# Patient Record
Sex: Female | Born: 1955 | Race: Black or African American | Hispanic: No | Marital: Single | Smoking: Never smoker
Health system: Southern US, Community
[De-identification: ages and names within clinical notes are randomized; demographics above are authoritative.]

## PROBLEM LIST (undated history)

## (undated) DIAGNOSIS — E119 Type 2 diabetes mellitus without complications: Secondary | ICD-10-CM

## (undated) DIAGNOSIS — M1990 Unspecified osteoarthritis, unspecified site: Secondary | ICD-10-CM

## (undated) DIAGNOSIS — M109 Gout, unspecified: Secondary | ICD-10-CM

## (undated) DIAGNOSIS — I10 Essential (primary) hypertension: Secondary | ICD-10-CM

## (undated) DIAGNOSIS — B019 Varicella without complication: Secondary | ICD-10-CM

## (undated) DIAGNOSIS — N390 Urinary tract infection, site not specified: Secondary | ICD-10-CM

## (undated) HISTORY — DX: Essential (primary) hypertension: I10

## (undated) HISTORY — DX: Gout, unspecified: M10.9

## (undated) HISTORY — PX: NO PAST SURGERIES: SHX2092

## (undated) HISTORY — DX: Varicella without complication: B01.9

## (undated) HISTORY — DX: Urinary tract infection, site not specified: N39.0

## (undated) HISTORY — DX: Type 2 diabetes mellitus without complications: E11.9

## (undated) HISTORY — DX: Unspecified osteoarthritis, unspecified site: M19.90

---

## 2004-04-24 ENCOUNTER — Emergency Department (HOSPITAL_COMMUNITY): Admission: EM | Admit: 2004-04-24 | Discharge: 2004-04-24 | Payer: Self-pay | Admitting: Emergency Medicine

## 2007-01-17 ENCOUNTER — Encounter: Admission: RE | Admit: 2007-01-17 | Discharge: 2007-01-17 | Payer: Self-pay | Admitting: Neurology

## 2008-04-30 ENCOUNTER — Emergency Department (HOSPITAL_COMMUNITY): Admission: EM | Admit: 2008-04-30 | Discharge: 2008-04-30 | Payer: Self-pay | Admitting: Emergency Medicine

## 2010-01-15 ENCOUNTER — Ambulatory Visit (HOSPITAL_COMMUNITY): Admission: RE | Admit: 2010-01-15 | Discharge: 2010-01-15 | Payer: Self-pay | Admitting: Obstetrics and Gynecology

## 2011-01-12 ENCOUNTER — Other Ambulatory Visit (HOSPITAL_COMMUNITY)
Admission: RE | Admit: 2011-01-12 | Discharge: 2011-01-12 | Disposition: A | Discharge status: Home / Self Care | Payer: Self-pay | Source: Ambulatory Visit | Attending: Obstetrics and Gynecology | Admitting: Obstetrics and Gynecology

## 2011-01-12 DIAGNOSIS — Z01419 Encounter for gynecological examination (general) (routine) without abnormal findings: Secondary | ICD-10-CM | POA: Insufficient documentation

## 2011-02-09 ENCOUNTER — Other Ambulatory Visit: Payer: Self-pay | Admitting: Obstetrics and Gynecology

## 2011-02-09 DIAGNOSIS — Z1231 Encounter for screening mammogram for malignant neoplasm of breast: Secondary | ICD-10-CM

## 2011-02-17 ENCOUNTER — Ambulatory Visit (HOSPITAL_COMMUNITY)
Admission: RE | Admit: 2011-02-17 | Discharge: 2011-02-17 | Disposition: A | Discharge status: Home / Self Care | Payer: Self-pay | Source: Ambulatory Visit | Attending: Obstetrics and Gynecology | Admitting: Obstetrics and Gynecology

## 2011-02-17 DIAGNOSIS — Z1231 Encounter for screening mammogram for malignant neoplasm of breast: Secondary | ICD-10-CM

## 2012-01-11 ENCOUNTER — Other Ambulatory Visit (HOSPITAL_COMMUNITY)
Admission: RE | Admit: 2012-01-11 | Discharge: 2012-01-11 | Disposition: A | Discharge status: Home / Self Care | Payer: Self-pay | Source: Ambulatory Visit | Attending: Obstetrics and Gynecology | Admitting: Obstetrics and Gynecology

## 2012-01-11 DIAGNOSIS — Z124 Encounter for screening for malignant neoplasm of cervix: Secondary | ICD-10-CM | POA: Insufficient documentation

## 2012-01-21 ENCOUNTER — Other Ambulatory Visit: Payer: Self-pay | Admitting: Obstetrics and Gynecology

## 2012-01-21 DIAGNOSIS — Z1231 Encounter for screening mammogram for malignant neoplasm of breast: Secondary | ICD-10-CM

## 2012-01-29 ENCOUNTER — Ambulatory Visit: Payer: Self-pay

## 2012-01-29 ENCOUNTER — Ambulatory Visit: Payer: Self-pay | Admitting: Family Medicine

## 2012-01-29 VITALS — BP 173/75 | HR 96 | Temp 98.7°F | Resp 18

## 2012-01-29 DIAGNOSIS — M79673 Pain in unspecified foot: Secondary | ICD-10-CM

## 2012-01-29 DIAGNOSIS — M7730 Calcaneal spur, unspecified foot: Secondary | ICD-10-CM

## 2012-01-29 DIAGNOSIS — M722 Plantar fascial fibromatosis: Secondary | ICD-10-CM

## 2012-01-29 DIAGNOSIS — M79609 Pain in unspecified limb: Secondary | ICD-10-CM

## 2012-01-29 MED ORDER — NABUMETONE 750 MG PO TABS: 750.0000 mg | ORAL_TABLET | Freq: Two times a day (BID) | ORAL | Status: AC

## 2012-01-29 NOTE — Patient Instructions (Signed)

## 2012-01-29 NOTE — Progress Notes (Signed)
Subjective: A couple of weeks ago the patient into the airports going to [place] and developed heel pain. It calmed back down, but then last week was a very busy week for her. She is on her feet a lot. She did look things up and had determined that she had plantar fasciitis. She been trying to do stretches with a towel, but he continues to hurt her a lot. She has not had problems with plantar fasciitis in the past.  Objective: Ankle is normal without edema. Pedal pulses present. Moves toes normally. He is tender on the plantar surface distal aspect of the heel.  [facility] reading (PRIMARY) by  Dr. Alwyn Ren Calcaneal spur, moderate.  No stress fractures  Exercises. Stretching. NSAID. Urged weight loss. We are heel sole inserts. We're comfortable shoes.Marland Kitchen

## 2012-02-18 ENCOUNTER — Ambulatory Visit (HOSPITAL_COMMUNITY)
Admission: RE | Admit: 2012-02-18 | Discharge: 2012-02-18 | Disposition: A | Discharge status: Home / Self Care | Payer: Self-pay | Source: Ambulatory Visit | Attending: Obstetrics and Gynecology | Admitting: Obstetrics and Gynecology

## 2012-02-18 DIAGNOSIS — Z1231 Encounter for screening mammogram for malignant neoplasm of breast: Secondary | ICD-10-CM | POA: Insufficient documentation

## 2012-03-10 ENCOUNTER — Ambulatory Visit: Payer: Self-pay | Admitting: Emergency Medicine

## 2012-03-10 VITALS — BP 131/85 | HR 101 | Temp 98.3°F | Resp 17 | Ht 70.0 in | Wt >= 6400 oz

## 2012-03-10 DIAGNOSIS — G510 Bell's palsy: Secondary | ICD-10-CM

## 2012-03-10 MED ORDER — VALACYCLOVIR HCL 1 G PO TABS: 1000.0000 mg | ORAL_TABLET | Freq: Two times a day (BID) | ORAL | Status: AC

## 2012-03-10 MED ORDER — PREDNISONE 20 MG PO TABS: ORAL_TABLET | ORAL | Status: DC

## 2012-03-10 NOTE — Patient Instructions (Signed)
Bell's Palsy  Bell's palsy is a condition in which the muscles on one side of the face cannot move (paralysis). This is because the nerves in the face are paralyzed. It is most often thought to be caused by a virus. The virus causes swelling of the nerve that controls movement on one side of the face. The nerve travels through a tight space surrounded by bone. When the nerve swells, it can be compressed by the bone. This results in damage to the protective covering around the nerve. This damage interferes with how the nerve communicates with the muscles of the face. As a result, it can cause weakness or paralysis of the facial muscles.   Injury (trauma), tumor, and surgery may cause Bell's palsy, but most of the time the cause is unknown. It is a relatively common condition. It starts suddenly (abrupt onset) with the paralysis usually ending within 2 days. Bell's palsy is not dangerous. But because the eye does not close properly, you may need care to keep the eye from getting dry. This can include splinting (to keep the eye shut) or moistening with artificial tears. Bell's palsy very seldom occurs on both sides of the face at the same time.  SYMPTOMS    Eyebrow sagging.   Drooping of the eyelid and corner of the mouth.   Inability to close one eye.   Loss of taste on the front of the tongue.   Sensitivity to loud noises.  TREATMENT   The treatment is usually non-surgical. If the patient is seen within the first 24 to 48 hours, a short course of steroids may be prescribed, in an attempt to shorten the length of the condition. Antiviral medicines may also be used with the steroids, but it is unclear if they are helpful.   You will need to protect your eye, if you cannot close it. The cornea (clear covering over your eye) will become dry and can be damaged. Artificial tears can be used to keep your eye moist. Glasses or an eye patch should be worn to protect your eye.  PROGNOSIS   Recovery is variable, ranging  from days to months. Although the problem usually goes away completely (about 80% of cases resolve), predicting the outcome is impossible. Most people improve within 3 weeks of when the symptoms began. Improvement may continue for 3 to 6 months. A small number of people have moderate to severe weakness that is permanent.   HOME CARE INSTRUCTIONS    If your caregiver prescribed medication to reduce swelling in the nerve, use as directed. Do not stop taking the medication unless directed by your caregiver.   Use moisturizing eye drops as needed to prevent drying of your eye, as directed by your caregiver.   Protect your eye, as directed by your caregiver.   Use facial massage and exercises, as directed by your caregiver.   Perform your normal activities, and get your normal rest.  SEEK IMMEDIATE MEDICAL CARE IF:    There is pain, redness or irritation in the eye.   You or your child has an oral temperature above 102 F (38.9 C), not controlled by medicine.  MAKE SURE YOU:    Understand these instructions.   Will watch your condition.   Will get help right away if you are not doing well or get worse.  Document Released: 08/23/2005 Document Revised: 08/12/2011 Document Reviewed: 09/01/2009  Exit[employer] Patient Information 2012 [employer].

## 2012-03-10 NOTE — Progress Notes (Signed)
  Subjective:    Patient ID: Mckenzie [name], female    DOB: 12-Mar-1956, 56 y.o.   MRN: [id number]  HPI patient started yesterday with pain and discomfort behind her left ear. She has a history of recurrent Bell's palsy her last was in 2008. She saw neurologist Dr. Lissa Morales. She's had no recurrent issues since that time until this most recent episode she has inability to close her left eye is paralysis of the left side of her face the    Review of Systems patient is a diabetic but keeps it under control with metformin and diet and exercise     Objective:   Physical Exam  Constitutional: She is oriented to person, place, and time. She appears well-developed.  HENT:  Head: Normocephalic.  Eyes: Pupils are equal, round, and reactive to light.  Cardiovascular: Normal rate.   Neurological: She is alert and oriented to person, place, and time. She has normal reflexes. She displays normal reflexes. She exhibits normal muscle tone. Coordination normal.       There is an isolated left seventh nerve palsy. She is incomplete I closure and asymmetrical smile.  Psychiatric: She has a normal mood and affect. Her behavior is normal. Judgment and thought content normal.          Assessment & Plan:  The patient presents with Bell's palsy. This is her third episode. I offered her the option of seeing the neurologist again to get their opinion since this is a recurrent issue at present she wants to treat with Valtrex prednisone and Lacri-Lube

## 2013-02-27 ENCOUNTER — Other Ambulatory Visit: Payer: Self-pay | Admitting: Obstetrics and Gynecology

## 2013-02-27 DIAGNOSIS — Z1231 Encounter for screening mammogram for malignant neoplasm of breast: Secondary | ICD-10-CM

## 2013-03-01 ENCOUNTER — Ambulatory Visit (HOSPITAL_COMMUNITY)
Admission: RE | Admit: 2013-03-01 | Discharge: 2013-03-01 | Disposition: A | Discharge status: Home / Self Care | Payer: Self-pay | Source: Ambulatory Visit | Attending: Obstetrics and Gynecology | Admitting: Obstetrics and Gynecology

## 2013-03-01 DIAGNOSIS — Z1231 Encounter for screening mammogram for malignant neoplasm of breast: Secondary | ICD-10-CM | POA: Insufficient documentation

## 2013-03-22 ENCOUNTER — Ambulatory Visit: Payer: Self-pay | Admitting: Emergency Medicine

## 2013-03-22 VITALS — BP 170/100 | HR 88 | Temp 98.2°F | Resp 18 | Ht 72.0 in | Wt >= 6400 oz

## 2013-03-22 DIAGNOSIS — I10 Essential (primary) hypertension: Secondary | ICD-10-CM | POA: Insufficient documentation

## 2013-03-22 DIAGNOSIS — R8281 Pyuria: Secondary | ICD-10-CM

## 2013-03-22 DIAGNOSIS — R8290 Unspecified abnormal findings in urine: Secondary | ICD-10-CM

## 2013-03-22 DIAGNOSIS — R030 Elevated blood-pressure reading, without diagnosis of hypertension: Secondary | ICD-10-CM

## 2013-03-22 DIAGNOSIS — R82998 Other abnormal findings in urine: Secondary | ICD-10-CM

## 2013-03-22 LAB — POCT URINALYSIS DIPSTICK
Glucose, UA: NEGATIVE
Ketones, UA: NEGATIVE
Spec Grav, UA: >=1.03
Urobilinogen, UA: 0.2
pH, UA: 5.5

## 2013-03-22 LAB — POCT CBC
Granulocyte percent: 61.9 % (ref 37–80)
HCT, POC: 43.6 % (ref 37.7–47.9)
Hemoglobin: 13.5 g/dL (ref 12.2–16.2)
Lymph, poc: 2.9 (ref 0.6–3.4)
MCH, POC: 30.3 pg (ref 27–31.2)
MCHC: 31 g/dL — AB (ref 31.8–35.4)
MCV: 97.8 fL — AB (ref 80–97)
MID (cbc): 0.7 (ref 0–0.9)
MPV: 8.4 fL (ref 0–99.8)
POC Granulocyte: 5.8 (ref 2–6.9)
POC LYMPH PERCENT: 30.6 % (ref 10–50)
POC MID %: 7.5 % (ref 0–12)
Platelet Count, POC: 360 10*3/uL (ref 142–424)
RBC: 4.46 M/uL (ref 4.04–5.48)
RDW, POC: 16.5 %
WBC: 9.4 10*3/uL (ref 4.6–10.2)

## 2013-03-22 LAB — POCT UA - MICROSCOPIC ONLY
Casts, Ur, LPF, POC: NEGATIVE
Crystals, Ur, HPF, POC: NEGATIVE
Mucus, UA: POSITIVE
Yeast, UA: NEGATIVE

## 2013-03-22 MED ORDER — CIPROFLOXACIN HCL 500 MG PO TABS: 500.0000 mg | ORAL_TABLET | Freq: Two times a day (BID) | ORAL | Status: DC

## 2013-03-22 MED ORDER — HYDROCHLOROTHIAZIDE 12.5 MG PO CAPS: 12.5000 mg | ORAL_CAPSULE | ORAL | Status: DC

## 2013-03-22 MED ORDER — AMLODIPINE BESYLATE 5 MG PO TABS: 5.0000 mg | ORAL_TABLET | Freq: Every day | ORAL | Status: DC

## 2013-03-22 NOTE — Patient Instructions (Signed)

## 2013-03-22 NOTE — Progress Notes (Signed)
  Subjective:    Patient ID: Mckenzie [name], female    DOB: [birth year], 57 y.o.   MRN: [id number]  HPI Went to Gyn for depo shot and blood pressure was up.  She was told to monitor and to follow up primary. No chest pains, no dizziness she states she feels fine. She has no history of hypertension. She has no family history of hypertension. She's never been on medications. She denies any symptoms.   Review of Systems     Objective:   Physical Exam patient is alert and cooperative in no distress. Neck is supple chest is clear to auscultation and percussion cardiac exam reveals a regular rate without murmurs rubs or gallops abdomen is soft liver spleen not enlarged no tenderness. Extremity exam shows trace edema.  Results for orders placed in visit on 03/22/13  POCT URINALYSIS DIPSTICK      Result Value Range   Color, UA yellow     Clarity, UA turbid     Glucose, UA neg     Bilirubin, UA neg     Ketones, UA neg     Spec Grav, UA >=1.030     Blood, UA moderate     pH, UA 5.5     Protein, UA >=300     Urobilinogen, UA 0.2     Nitrite, UA positive     Leukocytes, UA moderate (2+)    POCT CBC      Result Value Range   WBC 9.4  4.6 - 10.2 K/uL   Lymph, poc 2.9  0.6 - 3.4   POC LYMPH PERCENT 30.6  10 - 50 %L   MID (cbc) 0.7  0 - 0.9   POC MID % 7.5  0 - 12 %M   POC Granulocyte 5.8  2 - 6.9   Granulocyte percent 61.9  37 - 80 %G   RBC 4.46  4.04 - 5.48 M/uL   Hemoglobin 13.5  12.2 - 16.2 g/dL   HCT, POC 78.2  95.6 - 47.9 %   MCV 97.8 (*) 80 - 97 fL   MCH, POC 30.3  27 - 31.2 pg   MCHC 31.0 (*) 31.8 - 35.4 g/dL   RDW, POC 21.3     Platelet Count, POC 360  142 - 424 K/uL   MPV 8.4  0 - 99.8 fL  POCT UA - MICROSCOPIC ONLY      Result Value Range   WBC, Ur, HPF, POC 30-40     RBC, urine, microscopic 25-30     Bacteria, U Microscopic 3+     Mucus, UA positive     Epithelial cells, urine per micros 10-15     Crystals, Ur, HPF, POC neg     Casts, Ur, LPF, POC neg     Yeast, UA  neg          Assessment & Plan:  We'll start with amlodipine and HCTZ recheck blood pressure 2 weeks urine culture was done also treat her urinary tract infection and repeat urine on her followup visit.

## 2013-03-23 LAB — COMPREHENSIVE METABOLIC PANEL
ALT: 13 U/L (ref 0–35)
AST: 13 U/L (ref 0–37)
Alkaline Phosphatase: 64 U/L (ref 39–117)
BUN: 12 mg/dL (ref 6–23)
Calcium: 9.7 mg/dL (ref 8.4–10.5)
Creat: 0.7 mg/dL (ref 0.50–1.10)
Potassium: 3.7 mEq/L (ref 3.5–5.3)
Total Protein: 7.8 g/dL (ref 6.0–8.3)

## 2013-03-23 LAB — TSH: TSH: 3.927 u[IU]/mL (ref 0.350–4.500)

## 2013-04-05 ENCOUNTER — Ambulatory Visit: Payer: Self-pay | Admitting: Emergency Medicine

## 2013-04-05 VITALS — BP 152/86 | HR 96 | Temp 97.8°F | Resp 18 | Ht 72.0 in | Wt >= 6400 oz

## 2013-04-05 DIAGNOSIS — I10 Essential (primary) hypertension: Secondary | ICD-10-CM

## 2013-04-05 NOTE — Progress Notes (Signed)
  Subjective:    Patient ID: Mckenzie [name], female    DOB: July 02, 1956, 57 y.o.   MRN: [id number]  HPI  57 female patient presents for follow up to hypertension. She has been taking Norvasc since last OV- 03/22/13. Pt has no complaints today and feels great.  Review of Systems     Objective:   Physical Exam blood pressure right leg with a thigh cuff is 130/86         Assessment & Plan:  The change in blood pressure medication.

## 2013-04-05 NOTE — Patient Instructions (Signed)

## 2013-06-18 ENCOUNTER — Encounter: Payer: Self-pay | Admitting: Emergency Medicine

## 2013-06-18 ENCOUNTER — Ambulatory Visit: Payer: Self-pay | Admitting: Emergency Medicine

## 2013-06-18 VITALS — BP 137/90 | HR 80 | Temp 98.5°F | Resp 18 | Ht 72.0 in | Wt >= 6400 oz

## 2013-06-18 DIAGNOSIS — R030 Elevated blood-pressure reading, without diagnosis of hypertension: Secondary | ICD-10-CM

## 2013-06-18 DIAGNOSIS — I10 Essential (primary) hypertension: Secondary | ICD-10-CM

## 2013-06-18 MED ORDER — AMLODIPINE BESYLATE 10 MG PO TABS: 10.0000 mg | ORAL_TABLET | Freq: Every day | ORAL | Status: DC

## 2013-06-18 MED ORDER — HYDROCHLOROTHIAZIDE 12.5 MG PO CAPS: 12.5000 mg | ORAL_CAPSULE | ORAL | Status: DC

## 2013-06-18 NOTE — Patient Instructions (Signed)

## 2013-06-18 NOTE — Progress Notes (Signed)
  Subjective:    Patient ID: Mckenzie [name], female    DOB: [birth year], 57 y.o.   MRN: [id number]  HPI patient presents to followup on her high blood pressure. She's been on Microzide 12.5 along with amlodipine 5. She enters today for followup. She denies chest pain shortness of breath or other cardiovascular symptoms. Denies ankle edema but    Review of Systems     Objective:   Physical Exam chest is clear to both auscultation and percussion. Heart regular rate without murmurs. The abdomen is soft liver and spleen not enlarged no areas of tenderness no masses are felt. Extremities without edema        Assessment & Plan:  The patient cannot afford to have a sleep study at the present time. She weighs 417 pounds and has a neck size of 19. We'll increase amlodipine to 10 mg a day. When she gets insurance we'll proceed with a sleep study .

## 2013-12-01 ENCOUNTER — Ambulatory Visit: Payer: Self-pay | Admitting: Family Medicine

## 2013-12-01 VITALS — BP 132/84 | HR 92 | Temp 98.2°F | Resp 17 | Ht 72.0 in | Wt >= 6400 oz

## 2013-12-01 DIAGNOSIS — L309 Dermatitis, unspecified: Secondary | ICD-10-CM

## 2013-12-01 DIAGNOSIS — L259 Unspecified contact dermatitis, unspecified cause: Secondary | ICD-10-CM

## 2013-12-01 MED ORDER — FLUOCINONIDE-E 0.05 % EX CREA: 1.0000 "application " | TOPICAL_CREAM | Freq: Two times a day (BID) | CUTANEOUS | Status: DC

## 2013-12-01 NOTE — Patient Instructions (Signed)
Eczema Eczema, also called atopic dermatitis, is a skin disorder that causes inflammation of the skin. It causes a red rash and dry, scaly skin. The skin becomes very itchy. Eczema is generally worse during the cooler winter months and often improves with the warmth of summer. Eczema usually starts showing signs in infancy. Some children outgrow eczema, but it may last through adulthood.  CAUSES  The exact cause of eczema is not known, but it appears to run in families. People with eczema often have a family history of eczema, allergies, asthma, or hay fever. Eczema is not contagious. Flare-ups of the condition may be caused by:   Contact with something you are sensitive or allergic to.   Stress. SIGNS AND SYMPTOMS  Dry, scaly skin.   Red, itchy rash.   Itchiness. This may occur before the skin rash and may be very intense.  DIAGNOSIS  The diagnosis of eczema is usually made based on symptoms and medical history. TREATMENT  Eczema cannot be cured, but symptoms usually can be controlled with treatment and other strategies. A treatment plan might include:  Controlling the itching and scratching.   Use over-the-counter antihistamines as directed for itching. This is especially useful at night when the itching tends to be worse.   Use over-the-counter steroid creams as directed for itching.   Avoid scratching. Scratching makes the rash and itching worse. It may also result in a skin infection (impetigo) due to a break in the skin caused by scratching.   Keeping the skin well moisturized with creams every day. This will seal in moisture and help prevent dryness. Lotions that contain alcohol and water should be avoided because they can dry the skin.   Limiting exposure to things that you are sensitive or allergic to (allergens).   Recognizing situations that cause stress.   Developing a plan to manage stress.  HOME CARE INSTRUCTIONS   Only take over-the-counter or  prescription medicines as directed by your health care provider.   Do not use anything on the skin without checking with your health care provider.   Keep baths or showers short (5 minutes) in warm (not hot) water. Use mild cleansers for bathing. These should be unscented. You may add nonperfumed bath oil to the bath water. It is best to avoid soap and bubble bath.   Immediately after a bath or shower, when the skin is still damp, apply a moisturizing ointment to the entire body. This ointment should be a petroleum ointment. This will seal in moisture and help prevent dryness. The thicker the ointment, the better. These should be unscented.   Keep fingernails cut short. Children with eczema may need to wear soft gloves or mittens at night after applying an ointment.   Dress in clothes made of cotton or cotton blends. Dress lightly, because heat increases itching.   A child with eczema should stay away from anyone with fever blisters or cold sores. The virus that causes fever blisters (herpes simplex) can cause a serious skin infection in children with eczema. SEEK MEDICAL CARE IF:   Your itching interferes with sleep.   Your rash gets worse or is not better within 1 week after starting treatment.   You see pus or soft yellow scabs in the rash area.   You have a fever.   You have a rash flare-up after contact with someone who has fever blisters.  Document Released: 08/20/2000 Document Revised: 06/13/2013 Document Reviewed: 03/26/2013 [employer] Patient Information 2014 [employer].  

## 2013-12-01 NOTE — Progress Notes (Signed)
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  She has no history of leg pain or swelling.  Objective: Is a morbidly obese woman in middle-aged, no acute distress Examination of both lower extremities reveals trace edema with hyperpigmented skin as described above which is scaly and slightly indurated.  Assessment: This appears to be an eczematous type rash. The patient's morbid obesity probably contributes in that she does have slight edema.  Plan:Eczema - Plan: fluocinonide-emollient (LIDEX-E) 0.05 % cream  Signed, Elvina SidleKurt Holden Draughon, MD

## 2014-02-18 ENCOUNTER — Other Ambulatory Visit: Payer: Self-pay | Admitting: Obstetrics and Gynecology

## 2014-02-18 DIAGNOSIS — Z1231 Encounter for screening mammogram for malignant neoplasm of breast: Secondary | ICD-10-CM

## 2014-03-05 ENCOUNTER — Ambulatory Visit (HOSPITAL_COMMUNITY)
Admission: RE | Admit: 2014-03-05 | Discharge: 2014-03-05 | Disposition: A | Discharge status: Home / Self Care | Payer: Self-pay | Source: Ambulatory Visit | Attending: Obstetrics and Gynecology | Admitting: Obstetrics and Gynecology

## 2014-03-05 DIAGNOSIS — Z1231 Encounter for screening mammogram for malignant neoplasm of breast: Secondary | ICD-10-CM | POA: Insufficient documentation

## 2014-03-30 ENCOUNTER — Ambulatory Visit (INDEPENDENT_AMBULATORY_CARE_PROVIDER_SITE_OTHER): Payer: Self-pay | Admitting: Family Medicine

## 2014-03-30 VITALS — BP 144/82 | HR 80 | Temp 97.7°F | Resp 16 | Ht 72.0 in | Wt >= 6400 oz

## 2014-03-30 DIAGNOSIS — B001 Herpesviral vesicular dermatitis: Secondary | ICD-10-CM

## 2014-03-30 DIAGNOSIS — B009 Herpesviral infection, unspecified: Secondary | ICD-10-CM

## 2014-03-30 MED ORDER — ACYCLOVIR 200 MG PO CAPS: 200.0000 mg | ORAL_CAPSULE | Freq: Every day | ORAL | Status: DC

## 2014-03-30 NOTE — Progress Notes (Signed)
Is a 58 year old woman who comes in with her 2 daughters. She's complaining about upper lip cold sore that she's had for several days. It's getting worse and is associated with some swelling. She's never had this Before.  Patient's going on a cruise to [place] in several days and is embarrassed about the cold sore.  Objective: No acute distress, cheerful. Patient has an obvious cold sore from earlier border of her left upper lip near the midline.  Assessment: New-onset HSV 1  Fever blister - Plan: acyclovir (ZOVIRAX) 200 MG capsule  Signed, Elvina SidleKurt Jourdan Durbin, MD

## 2014-06-12 ENCOUNTER — Other Ambulatory Visit: Payer: Self-pay | Admitting: Emergency Medicine

## 2014-07-09 ENCOUNTER — Other Ambulatory Visit: Payer: Self-pay | Admitting: Emergency Medicine

## 2014-07-11 ENCOUNTER — Ambulatory Visit (INDEPENDENT_AMBULATORY_CARE_PROVIDER_SITE_OTHER): Payer: Self-pay | Admitting: Emergency Medicine

## 2014-07-11 ENCOUNTER — Encounter: Payer: Self-pay | Admitting: Emergency Medicine

## 2014-07-11 VITALS — BP 132/76 | HR 86 | Temp 99.2°F | Resp 16 | Ht 71.0 in

## 2014-07-11 DIAGNOSIS — I10 Essential (primary) hypertension: Secondary | ICD-10-CM

## 2014-07-11 DIAGNOSIS — Z130 Encounter for screening for diseases of the blood and blood-forming organs and certain disorders involving the immune mechanism: Secondary | ICD-10-CM

## 2014-07-11 DIAGNOSIS — I8312 Varicose veins of left lower extremity with inflammation: Secondary | ICD-10-CM

## 2014-07-11 DIAGNOSIS — I8311 Varicose veins of right lower extremity with inflammation: Secondary | ICD-10-CM

## 2014-07-11 DIAGNOSIS — M25559 Pain in unspecified hip: Secondary | ICD-10-CM

## 2014-07-11 DIAGNOSIS — I872 Venous insufficiency (chronic) (peripheral): Secondary | ICD-10-CM

## 2014-07-11 LAB — CBC WITH DIFFERENTIAL/PLATELET
BASOS PCT: 0 % (ref 0–1)
Basophils Absolute: 0 10*3/uL (ref 0.0–0.1)
EOS ABS: 0.2 10*3/uL (ref 0.0–0.7)
Eosinophils Relative: 3 % (ref 0–5)
HCT: 37.6 % (ref 36.0–46.0)
Hemoglobin: 12.8 g/dL (ref 12.0–15.0)
Lymphocytes Relative: 31 % (ref 12–46)
Lymphs Abs: 2.3 10*3/uL (ref 0.7–4.0)
MCH: 29 pg (ref 26.0–34.0)
MCHC: 34 g/dL (ref 30.0–36.0)
MCV: 85.1 fL (ref 78.0–100.0)
MONOS PCT: 6 % (ref 3–12)
Monocytes Absolute: 0.4 10*3/uL (ref 0.1–1.0)
NEUTROS PCT: 60 % (ref 43–77)
Neutro Abs: 4.4 10*3/uL (ref 1.7–7.7)
Platelets: 314 10*3/uL (ref 150–400)
RBC: 4.42 MIL/uL (ref 3.87–5.11)
RDW: 15.1 % (ref 11.5–15.5)
WBC: 7.4 10*3/uL (ref 4.0–10.5)

## 2014-07-11 LAB — BASIC METABOLIC PANEL WITH GFR
BUN: 14 mg/dL (ref 6–23)
CO2: 28 mEq/L (ref 19–32)
Calcium: 9.5 mg/dL (ref 8.4–10.5)
Chloride: 102 mEq/L (ref 96–112)
Creat: 0.69 mg/dL (ref 0.50–1.10)
GFR, Est Non African American: >89 mL/min
Glucose, Bld: 100 mg/dL — ABNORMAL HIGH (ref 70–99)
Potassium: 4 mEq/L (ref 3.5–5.3)
Sodium: 140 mEq/L (ref 135–145)

## 2014-07-11 MED ORDER — HYDROCHLOROTHIAZIDE 12.5 MG PO CAPS: 12.5000 mg | ORAL_CAPSULE | Freq: Every day | ORAL | Status: DC

## 2014-07-11 MED ORDER — AMLODIPINE BESYLATE 10 MG PO TABS: 10.0000 mg | ORAL_TABLET | Freq: Every day | ORAL | Status: DC

## 2014-07-11 MED ORDER — IBUPROFEN 800 MG PO TABS: 800.0000 mg | ORAL_TABLET | ORAL | Status: DC | PRN

## 2014-07-11 NOTE — Progress Notes (Signed)
   Subjective:    Patient ID: Mckenzie [name], female    DOB: [birth year], 58 y.o.   MRN: [id number]  HPI Patient presents for medication refill. Has not missed any doses of amlodipine or HCTZ. Denies SOB, CP, HA/dizziness, orthopnea, edema, or palpitations. Has been doing cardio and strength training 3x/week.   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 Pain concentrated in hips.  Lidex-E for leg is not working. Has used since it was prescribed, but has seen no change in leg's skin texture.   Review of Systems  Constitutional: Negative for fever and fatigue.  Respiratory: Negative for chest tightness and shortness of breath.   Cardiovascular: Negative for chest pain, palpitations and leg swelling.  Musculoskeletal: Positive for arthralgias (not currently; hips when standing for length of time). Negative for back pain and joint swelling.  Skin:       Rough texture  Neurological: Negative for dizziness, weakness, light-headedness and headaches.       Objective:   Physical Exam  Constitutional: She is oriented to person, place, and time. She appears well-developed and well-nourished. No distress.  Blood pressure 132/76, pulse 86, temperature 99.2 F (37.3 C), resp. rate 16, height 5\' 11"  (1.803 m), weight 0 lb (0 kg), SpO2 96 %.   HENT:  Head: Normocephalic and atraumatic.  Right Ear: External ear normal.  Left Ear: External ear normal.  Nose: Nose normal.  Mouth/Throat: Oropharynx is clear and moist. No oropharyngeal exudate.  Eyes: Conjunctivae and EOM are normal. Pupils are equal, round, and reactive to light. Right eye exhibits no discharge. Left eye exhibits no discharge. No scleral icterus.  Neck: Normal range of motion. Neck supple. No thyromegaly present.  Cardiovascular: Normal rate, regular rhythm, normal heart sounds and intact distal pulses.  Exam reveals no gallop and no friction rub.   No murmur heard. Pulmonary/Chest: Effort normal  and breath sounds normal. She has no wheezes. She has no rales.  Abdominal: Soft. There is no tenderness.  Musculoskeletal: She exhibits edema (minimal). She exhibits no tenderness.  Lymphadenopathy:    She has no cervical adenopathy.  Neurological: She is alert and oriented to person, place, and time.  Skin: Skin is warm and dry. No rash noted. She is not diaphoretic. No erythema. No pallor.  Hyperpigmentation and unsmooth texture of medial aspect of left and right calf        Assessment & Plan:  1. Essential hypertension, benign Controlled with meds. Refills of amlodipine and HCTZ today. - BASIC METABOLIC PANEL WITH GFR  2. Screening, anemia, deficiency, iron - CBC with Differential  3. Stasis dermatitis Lidex not working. Will continue to use Eucerin lotion.  4. Hip pain Continue ibuprofen prn. Patient not using daily.   Janan Ridgeishira Joylynn Defrancesco PA-C  [facility] 07/11/2014 12:21 PM

## 2014-07-14 ENCOUNTER — Encounter: Payer: Self-pay | Admitting: *Deleted

## 2014-08-03 ENCOUNTER — Ambulatory Visit (INDEPENDENT_AMBULATORY_CARE_PROVIDER_SITE_OTHER): Payer: Self-pay | Admitting: Family Medicine

## 2014-08-03 VITALS — BP 108/72 | HR 24 | Temp 98.8°F | Resp 82 | Ht 72.0 in | Wt >= 6400 oz

## 2014-08-03 DIAGNOSIS — M5441 Lumbago with sciatica, right side: Secondary | ICD-10-CM

## 2014-08-03 DIAGNOSIS — M542 Cervicalgia: Secondary | ICD-10-CM

## 2014-08-03 MED ORDER — PREDNISONE 20 MG PO TABS: ORAL_TABLET | ORAL | Status: DC

## 2014-08-03 MED ORDER — KETOROLAC TROMETHAMINE 60 MG/2ML IM SOLN
60.0000 mg | Freq: Once | INTRAMUSCULAR | Status: AC
  Administered 2014-08-03: 60 mg via INTRAMUSCULAR

## 2014-08-03 MED ORDER — METHYLPREDNISOLONE ACETATE 80 MG/ML IJ SUSP
120.0000 mg | Freq: Once | INTRAMUSCULAR | Status: AC
  Administered 2014-08-03: 120 mg via INTRAMUSCULAR

## 2014-08-03 NOTE — Progress Notes (Signed)
Subjective:   This chart was scribed for Norberto SorensonEva Hatcher Froning MD by Arlan OrganAshley Leger, [facility] Scribe. This patient was seen in room 4 and the patient's care was started 3:33 PM.     Patient ID: Mckenzie [name], female    DOB: March 03, 1956, 58 y.o.   MRN: [id number]  Chief Complaint  Patient presents with  . Back Pain     HPI  HPI Comments: Mckenzie [name] Crutchfield is [name] 58 y.o. female with [name] PMHx of HTN who presents to [facility] complaining of constant, moderate lower back pain that radiates down the R lower extremity (stops right at the knee) onset yesterday that is unchanged at this time. Pt states her back went out secondary to sneezing which she states is typical for her. States she typically experiences same symptoms once every 2 years. In past, pt was been treated with course of Prednisone taper with success and complete improvement for symptoms. For current episode, [name] has tried OTC Ibuprofen and heat application without any improvement for symptoms. She denies any fever, chills, nausea, or vomiting. No weakness, loss of sensation, or numbness. No urinary or bowel issues. No known allergies to medications.  Past Medical History  Diagnosis Date  . Hypertension     Current Outpatient Prescriptions on File Prior to Visit  Medication Sig Dispense Refill  . amLODipine (NORVASC) 10 MG tablet Take 1 tablet (10 mg total) by mouth daily. 90 tablet 4  . hydrochlorothiazide (MICROZIDE) 12.5 MG capsule Take 1 capsule (12.5 mg total) by mouth daily. 90 capsule 3  . ibuprofen (ADVIL,MOTRIN) 800 MG tablet Take 1 tablet (800 mg total) by mouth as needed. 60 tablet 4  . fluocinonide-emollient (LIDEX-E) 0.05 % cream Apply 1 application topically 2 (two) times daily. (Patient not taking: Reported on 08/03/2014) 60 g 0   No current facility-administered medications on file prior to visit.    No Known Allergies    Review of Systems  Constitutional: Negative for fever  and chills.  Respiratory: Negative for shortness of breath.   Cardiovascular: Negative for chest pain.  Gastrointestinal: Negative for nausea and vomiting.  Musculoskeletal: Positive for back pain.  Neurological: Negative for weakness and numbness.    Triage Vitals: BP 108/72 mmHg  Pulse 24  Temp(Src) 98.8 F (37.1 C) (Oral)  Resp 82  Ht 6' (1.829 m)  Wt 417 lb (189.15 kg)  BMI 56.54 kg/m2  SpO2 98%   Objective:  Physical Exam  Constitutional: She is oriented to person, place, and time. She appears well-developed and well-nourished.  HENT:  Head: Normocephalic.  Eyes: EOM are normal.  Neck: Normal range of motion.  Cardiovascular:  Pulses:      Posterior tibial pulses are 2+ on the right side, and 2+ on the left side.  Pulmonary/Chest: Effort normal.  Abdominal: She exhibits no distension.  Musculoskeletal: Normal range of motion.  Negative straight leg raise in seating position bilaterally Excellent lumbar ROM  Neurological: She is alert and oriented to person, place, and time.  Reflex Scores:      Bicep reflexes are 2+ on the right side and 2+ on the left side.      Brachioradialis reflexes are 2+ on the right side and 2+ on the left side. Psychiatric: She has [name] normal mood and affect.  Nursing note and vitals reviewed.    Assessment & Plan:   Will give Toradol and DepoMedral injection in office to help manage symptoms for  pt.  Right-sided low back pain with right-sided sciatica - Plan: methylPREDNISolone acetate (DEPO-MEDROL) injection 120 mg, ketorolac (TORADOL) injection 60 mg - Pt has recurrent flairs of sciatica - nothing unchaged or new about current - pt reports she responds very well to prednisone taper and requests this rx as well as requests for [name] "strong shot" to hasten her recovery.  Cervicalgia - rec neck support while sleeping, heat, gentle stretch  Meds ordered this encounter  Medications  . methylPREDNISolone acetate (DEPO-MEDROL) injection 120 mg      Sig:   . ketorolac (TORADOL) injection 60 mg    Sig:   . predniSONE (DELTASONE) 20 MG tablet    Sig: 3-3-2-2-2-1-1-1-1 - 1/2- 1/2-1/2-1/2 tabs po qd    Dispense:  18 tablet    Refill:  0    I personally performed the services described in this documentation, which was scribed in my presence. The recorded information has been reviewed and considered, and addended by me as needed.  Norberto SorensonEva Sahas Sluka, MD MPH

## 2014-08-03 NOTE — Patient Instructions (Signed)

## 2014-11-18 ENCOUNTER — Ambulatory Visit (INDEPENDENT_AMBULATORY_CARE_PROVIDER_SITE_OTHER): Payer: Self-pay | Admitting: Physician Assistant

## 2014-11-18 VITALS — BP 132/84 | HR 87 | Temp 98.0°F | Resp 18 | Ht 72.0 in | Wt >= 6400 oz

## 2014-11-18 DIAGNOSIS — R350 Frequency of micturition: Secondary | ICD-10-CM

## 2014-11-18 DIAGNOSIS — N3090 Cystitis, unspecified without hematuria: Secondary | ICD-10-CM

## 2014-11-18 DIAGNOSIS — M545 Low back pain: Secondary | ICD-10-CM

## 2014-11-18 LAB — POCT UA - MICROSCOPIC ONLY
CASTS, UR, LPF, POC: NEGATIVE
Crystals, Ur, HPF, POC: NEGATIVE
Mucus, UA: NEGATIVE
YEAST UA: NEGATIVE

## 2014-11-18 LAB — POCT URINALYSIS DIPSTICK
BILIRUBIN UA: NEGATIVE
Glucose, UA: NEGATIVE
Ketones, UA: NEGATIVE
Nitrite, UA: NEGATIVE
Protein, UA: 30
SPEC GRAV UA: 1.025
Urobilinogen, UA: 0.2
pH, UA: 5.5

## 2014-11-18 MED ORDER — CIPROFLOXACIN HCL 500 MG PO TABS: 500.0000 mg | ORAL_TABLET | Freq: Two times a day (BID) | ORAL | Status: AC

## 2014-11-18 NOTE — Progress Notes (Signed)
Subjective:    Patient ID: Mckenzie [name], female    DOB: 03-Oct-1955, 59 y.o.   MRN: [id number]  HPI   This is a 59 year old female with PMH HTN who is presenting with 3 weeks of urinary frequency.  Denies dysuria. States she has had a UTI before and this is very similar to prior UTIs -last UTI 2-3 years ago. She is having some current vaginal irritation and mild itching. She is having some right lower back pain that is not new. She has had this on and off for 20 years - most recently started back 5 months ago. She is going to a chiropractor a few times a week and the pain is improving. She takes ibuprofen 800 mg in the mornings and helping some. She denies hematuria, abdominal pain, N/V, fever, chills, or vaginal discharge. She is not currently sexually active. She is post-menopausal.  Pap and mammogram scheduled in a month.   Review of Systems  Constitutional: Negative for fever and chills.  Gastrointestinal: Negative for nausea, vomiting and abdominal pain.  Genitourinary: Positive for frequency. Negative for dysuria, hematuria and vaginal discharge.  Musculoskeletal: Positive for back pain.  Skin: Negative for rash.  Hematological: Negative for adenopathy.    Patient Active Problem List   Diagnosis Date Noted  . Morbid obesity 06/18/2013  . Unspecified essential hypertension 03/22/2013   Prior to Admission medications   Medication Sig Start Date End Date Taking? Authorizing Provider  amLODipine (NORVASC) 10 MG tablet Take 1 tablet (10 mg total) by mouth daily. 07/11/14  Yes Tishira R Brewington, PA-C  hydrochlorothiazide (MICROZIDE) 12.5 MG capsule Take 1 capsule (12.5 mg total) by mouth daily. 07/11/14  Yes Tishira R Brewington, PA-C  ibuprofen (ADVIL,MOTRIN) 800 MG tablet Take 1 tablet (800 mg total) by mouth as needed. 07/11/14  Yes Tishira R Brewington, PA-C   No Known Allergies  Patient's social and family history were reviewed.     Objective:   Physical Exam    Constitutional: She is oriented to person, place, and time. She appears well-developed and well-nourished. No distress.  HENT:  Head: Normocephalic and atraumatic.  Right Ear: Hearing normal.  Left Ear: Hearing normal.  Nose: Nose normal.  Eyes: Conjunctivae and lids are normal. Right eye exhibits no discharge. Left eye exhibits no discharge. No scleral icterus.  Cardiovascular: Normal rate, regular rhythm, normal heart sounds, intact distal pulses and normal pulses.   No murmur heard. Pulmonary/Chest: Effort normal and breath sounds normal. No respiratory distress. She has no wheezes. She has no rhonchi. She has no rales.  Abdominal: Normal appearance and bowel sounds are normal. There is no tenderness. There is no CVA tenderness.  Obese No suprapubic tenderness   Musculoskeletal: Normal range of motion.       Lumbar back: Normal. She exhibits normal range of motion, no tenderness and no bony tenderness.  Negative straight leg raise Pt point to right lumbar paraspinal region as area of pain but no tenderness with palpation  Neurological: She is alert and oriented to person, place, and time. She has normal strength and normal reflexes. No sensory deficit. Gait normal.  Skin: Skin is warm, dry and intact. No lesion and no rash noted.  Psychiatric: She has a normal mood and affect. Her speech is normal and behavior is normal. Thought content normal.   BP 132/84 mmHg  Pulse 87  Temp(Src) 98 F (36.7 C) (Oral)  Resp 18  Ht 6' (1.829 m)  Wt 417 lb (  189.15 kg)  BMI 56.54 kg/m2  SpO2 95%  Results for orders placed or performed in visit on 11/18/14  POCT UA - Microscopic Only  Result Value Ref Range   WBC, Ur, HPF, POC 5-10    RBC, urine, microscopic 0-2    Bacteria, U Microscopic small    Mucus, UA neg    Epithelial cells, urine per micros 0-2    Crystals, Ur, HPF, POC neg    Casts, Ur, LPF, POC neg    Yeast, UA neg   POCT urinalysis dipstick  Result Value Ref Range   Color,  UA yellow    Clarity, UA clear    Glucose, UA neg    Bilirubin, UA neg    Ketones, UA neg    Spec Grav, UA 1.025    Blood, UA tr-lysed    pH, UA 5.5    Protein, UA 30    Urobilinogen, UA 0.2    Nitrite, UA neg    Leukocytes, UA moderate (2+)        Assessment & Plan:  1. Urinary frequency 2. Low back pain without sciatica 3. Cystitis  UA suggestive of UTI. Urine clx pending. Prescribed cipro as this has worked for her in the past. She will continue with ibuprofen and chiropractor for back pain and return to see Dr. Elbert EwingsL if this does not continue to improve. She will return in 5-7 days if urinary symptoms are not improving.  - POCT UA - Microscopic Only - POCT urinalysis dipstick - Urine culture - ciprofloxacin (CIPRO) 500 MG tablet; Take 1 tablet (500 mg total) by mouth 2 (two) times daily.  Dispense: 10 tablet; Refill: 0   Roswell MinersNicole V. Dyke BrackettBush, PA-C, MHS [facility]  11/18/2014

## 2014-11-18 NOTE — Patient Instructions (Signed)
Continue with ibuprofen for back pain. Take antibiotics until finished. Drink plenty of water and cranberry juice.  Return if your symptoms are not improving after the antibiotics. Return to see Dr. Elbert EwingsL if you have continued back pain.

## 2014-11-21 LAB — URINE CULTURE: Colony Count: >=100000

## 2014-11-28 ENCOUNTER — Encounter: Payer: Self-pay | Admitting: Emergency Medicine

## 2014-12-18 ENCOUNTER — Ambulatory Visit (INDEPENDENT_AMBULATORY_CARE_PROVIDER_SITE_OTHER): Payer: Self-pay | Admitting: Physician Assistant

## 2014-12-18 VITALS — BP 110/80 | HR 88 | Temp 98.8°F | Resp 16 | Ht 73.0 in | Wt >= 6400 oz

## 2014-12-18 DIAGNOSIS — M5441 Lumbago with sciatica, right side: Secondary | ICD-10-CM

## 2014-12-18 DIAGNOSIS — M5431 Sciatica, right side: Secondary | ICD-10-CM | POA: Insufficient documentation

## 2014-12-18 MED ORDER — PREDNISONE 20 MG PO TABS: 20.0000 mg | ORAL_TABLET | Freq: Every day | ORAL | Status: DC

## 2014-12-18 MED ORDER — KETOROLAC TROMETHAMINE 60 MG/2ML IM SOLN
60.0000 mg | Freq: Once | INTRAMUSCULAR | Status: AC
  Administered 2014-12-18: 60 mg via INTRAMUSCULAR

## 2014-12-18 NOTE — Progress Notes (Signed)
   Subjective:    Patient ID: Mckenzie HawkingKathy A Baack, female    DOB: November 22, 1955, 59 y.o.   MRN: [id number]  Chief Complaint  Patient presents with  . Nerve pain    right side x 7 days   Patient Active Problem List   Diagnosis Date Noted  . Sciatica of right side 12/18/2014  . Morbid obesity 06/18/2013  . Unspecified essential hypertension 03/22/2013   Prior to Admission medications   Medication Sig Start Date End Date Taking? Authorizing Provider  amLODipine (NORVASC) 10 MG tablet Take 1 tablet (10 mg total) by mouth daily. 07/11/14  Yes Tishira R Brewington, PA-C  hydrochlorothiazide (MICROZIDE) 12.5 MG capsule Take 1 capsule (12.5 mg total) by mouth daily. 07/11/14  Yes Tishira R Brewington, PA-C  ibuprofen (ADVIL,MOTRIN) 800 MG tablet Take 1 tablet (800 mg total) by mouth as needed. 07/11/14  Yes Tishira R Brewington, PA-C   Medications, allergies, past medical history, surgical history, family history, social history and problem list reviewed and updated.  HPI  [age] yof with pmh recurrent right leg sciatica, obesity presents with recurrence right leg pain.   Hx intermittent sciatica down right leg for many yrs. Was last here 1.5 yrs ago for this issue. Tx with steroid injx, oral prednisone taper, and toradol. Per pt this improved her sx greatly and she would like the same tx today.   Was on a plane ride several days ago, when got off plane feels low back went out. Has had constant right buttock tightness and intermittent shooting pains down right leg to popliteal for past few days. Denies bowel/bladder dysfx. Denies perianal los.   Goes to chiro 3x week for her back. Has electrical stimulation unit at home. Has never seen PT for sciatica. Declines ortho or xr as she has no insurance.    Obese female. BG was normal 2 yrs ago. Denies polyuria, polydipsia.   Review of Systems No fevers, chills.     Objective:   Physical Exam  Constitutional: She appears well-developed and well-nourished.   Non-toxic appearance. She does not have a sickly appearance. She does not appear ill. No distress.  BP 110/80 mmHg  Pulse 88  Temp(Src) 98.8 F (37.1 C) (Oral)  Resp 16  Ht 6\' 1"  (1.854 m)  Wt 417 lb (189.15 kg)  BMI 55.03 kg/m2  SpO2 96%   Musculoskeletal:       Lumbar back: She exhibits tenderness. She exhibits normal range of motion, no bony tenderness and no spasm.       Back:       Right upper leg: She exhibits no tenderness and no bony tenderness.  Mild ttp right low back and right buttock.   Neurological:  Negative straight leg raise bilateral. Normal strength, sensation RLE. 2+ DP, PT RLE.       Assessment & Plan:   [age] yof with pmh recurrent right leg sciatica, obesity presents with recurrence right leg pain.   Right-sided low back pain with right-sided sciatica - Plan: ketorolac (TORADOL) injection 60 mg, Ambulatory referral to Physical Therapy --toradol, pred burst/taper, tylenol prn --exercises given --PT referral, pt willing to pay for few sessions in order to get some supervised exercises --discussed proper posture, importance of wt loss --declines ortho referral, lumbar xr as she has no insurance  Donnajean Lopesodd M. Quashawn Jewkes, PA-C Physician Assistant-Certified [facility]  12/18/2014 4:39 PM

## 2014-12-18 NOTE — Patient Instructions (Addendum)
You received a Toradol injection today.  Please take the prednisone as prescribed: Day 1: 60 mg then 60, 60, 60, 40, 40, 40, 20, 20, 20, 10, 10, 10 Please look into the exercises as below. Please take tylenol as needed for pain the next few days since you received the Toradol today.  I have referred you to PT and they will be in contact with you to schedule.  Please let us know if you're not improved with these measures.    Sciatica with Rehab The sciatic nerve runs from the back down the leg and is responsible for sensation and control of the muscles in the back (posterior) side of the thigh, lower leg, and foot. Sciatica is a condition that is characterized by inflammation of this nerve.  SYMPTOMS   Signs of nerve damage, including numbness and/or weakness along the posterior side of the lower extremity.  Pain in the back of the thigh that may also travel down the leg.  Pain that worsens when sitting for long periods of time.  Occasionally, pain in the back or buttock. CAUSES  Inflammation of the sciatic nerve is the cause of sciatica. The inflammation is due to something irritating the nerve. Common sources of irritation include:  Sitting for long periods of time.  Direct trauma to the nerve.  Arthritis of the spine.  Herniated or ruptured disk.  Slipping of the vertebrae (spondylolisthesis).  Pressure from soft tissues, such as muscles or ligament-like tissue (fascia). RISK INCREASES WITH:  Sports that place pressure or stress on the spine (football or weightlifting).  Poor strength and flexibility.  Failure to warm up properly before activity.  Family history of low back pain or disk disorders.  Previous back injury or surgery.  Poor body mechanics, especially when lifting, or poor posture. PREVENTION   Warm up and stretch properly before activity.  Maintain physical fitness:  Strength, flexibility, and endurance.  Cardiovascular fitness.  Learn and use  proper technique, especially with posture and lifting. When possible, have coach correct improper technique.  Avoid activities that place stress on the spine. PROGNOSIS If treated properly, then sciatica usually resolves within 6 weeks. However, occasionally surgery is necessary.  RELATED COMPLICATIONS   Permanent nerve damage, including pain, numbness, tingle, or weakness.  Chronic back pain.  Risks of surgery: infection, bleeding, nerve damage, or damage to surrounding tissues. TREATMENT Treatment initially involves resting from any activities that aggravate your symptoms. The use of ice and medication may help reduce pain and inflammation. The use of strengthening and stretching exercises may help reduce pain with activity. These exercises may be performed at home or with referral to a therapist. A therapist may recommend further treatments, such as transcutaneous electronic nerve stimulation (TENS) or ultrasound. Your caregiver may recommend corticosteroid injections to help reduce inflammation of the sciatic nerve. If symptoms persist despite non-surgical (conservative) treatment, then surgery may be recommended. MEDICATION  If pain medication is necessary, then nonsteroidal anti-inflammatory medications, such as aspirin and ibuprofen, or other minor pain relievers, such as acetaminophen, are often recommended.  Do not take pain medication for 7 days before surgery.  Prescription pain relievers may be given if deemed necessary by your caregiver. Use only as directed and only as much as you need.  Ointments applied to the skin may be helpful.  Corticosteroid injections may be given by your caregiver. These injections should be reserved for the most serious cases, because they may only be given a certain number of times. HEAT AND  COLD  Cold treatment (icing) relieves pain and reduces inflammation. Cold treatment should be applied for 10 to 15 minutes every 2 to 3 hours for inflammation  and pain and immediately after any activity that aggravates your symptoms. Use ice packs or massage the area with a piece of ice (ice massage).  Heat treatment may be used prior to performing the stretching and strengthening activities prescribed by your caregiver, physical therapist, or athletic trainer. Use a heat pack or soak the injury in warm water. SEEK MEDICAL CARE IF:  Treatment seems to offer no benefit, or the condition worsens.  Any medications produce adverse side effects. EXERCISES  RANGE OF MOTION (ROM) AND STRETCHING EXERCISES - Sciatica Most people with sciatic will find that their symptoms worsen with either excessive bending forward (flexion) or arching at the low back (extension). The exercises which will help resolve your symptoms will focus on the opposite motion. Your physician, physical therapist or athletic trainer will help you determine which exercises will be most helpful to resolve your low back pain. Do not complete any exercises without first consulting with your clinician. Discontinue any exercises which worsen your symptoms until you speak to your clinician. If you have pain, numbness or tingling which travels down into your buttocks, leg or foot, the goal of the therapy is for these symptoms to move closer to your back and eventually resolve. Occasionally, these leg symptoms will get better, but your low back pain may worsen; this is typically an indication of progress in your rehabilitation. Be certain to be very alert to any changes in your symptoms and the activities in which you participated in the 24 hours prior to the change. Sharing this information with your clinician will allow him/her to most efficiently treat your condition. These exercises may help you when beginning to rehabilitate your injury. Your symptoms may resolve with or without further involvement from your physician, physical therapist or athletic trainer. While completing these exercises, remember:     Restoring tissue flexibility helps normal motion to return to the joints. This allows healthier, less painful movement and activity.  An effective stretch should be held for at least 30 seconds.  A stretch should never be painful. You should only feel a gentle lengthening or release in the stretched tissue. FLEXION RANGE OF MOTION AND STRETCHING EXERCISES: STRETCH - Flexion, Single Knee to Chest   Lie on a firm bed or floor with both legs extended in front of you.  Keeping one leg in contact with the floor, bring your opposite knee to your chest. Hold your leg in place by either grabbing behind your thigh or at your knee.  Pull until you feel a gentle stretch in your low back. Hold __________ seconds.  Slowly release your grasp and repeat the exercise with the opposite side. Repeat __________ times. Complete this exercise __________ times per day.  STRETCH - Flexion, Double Knee to Chest  Lie on a firm bed or floor with both legs extended in front of you.  Keeping one leg in contact with the floor, bring your opposite knee to your chest.  Tense your stomach muscles to support your back and then lift your other knee to your chest. Hold your legs in place by either grabbing behind your thighs or at your knees.  Pull both knees toward your chest until you feel a gentle stretch in your low back. Hold __________ seconds.  Tense your stomach muscles and slowly return one leg at a time to the  floor. Repeat __________ times. Complete this exercise __________ times per day.  STRETCH - Low Trunk Rotation   Lie on a firm bed or floor. Keeping your legs in front of you, bend your knees so they are both pointed toward the ceiling and your feet are flat on the floor.  Extend your arms out to the side. This will stabilize your upper body by keeping your shoulders in contact with the floor.  Gently and slowly drop both knees together to one side until you feel a gentle stretch in your low back.  Hold for __________ seconds.  Tense your stomach muscles to support your low back as you bring your knees back to the starting position. Repeat the exercise to the other side. Repeat __________ times. Complete this exercise __________ times per day  EXTENSION RANGE OF MOTION AND FLEXIBILITY EXERCISES: STRETCH - Extension, Prone on Elbows  Lie on your stomach on the floor, a bed will be too soft. Place your palms about shoulder width apart and at the height of your head.  Place your elbows under your shoulders. If this is too painful, stack pillows under your chest.  Allow your body to relax so that your hips drop lower and make contact more completely with the floor.  Hold this position for __________ seconds.  Slowly return to lying flat on the floor. Repeat __________ times. Complete this exercise __________ times per day.  RANGE OF MOTION - Extension, Prone Press Ups  Lie on your stomach on the floor, a bed will be too soft. Place your palms about shoulder width apart and at the height of your head.  Keeping your back as relaxed as possible, slowly straighten your elbows while keeping your hips on the floor. You may adjust the placement of your hands to maximize your comfort. As you gain motion, your hands will come more underneath your shoulders.  Hold this position __________ seconds.  Slowly return to lying flat on the floor. Repeat __________ times. Complete this exercise __________ times per day.  STRENGTHENING EXERCISES - Sciatica  These exercises may help you when beginning to rehabilitate your injury. These exercises should be done near your "sweet spot." This is the neutral, low-back arch, somewhere between fully rounded and fully arched, that is your least painful position. When performed in this safe range of motion, these exercises can be used for people who have either a flexion or extension based injury. These exercises may resolve your symptoms with or without further  involvement from your physician, physical therapist or athletic trainer. While completing these exercises, remember:   Muscles can gain both the endurance and the strength needed for everyday activities through controlled exercises.  Complete these exercises as instructed by your physician, physical therapist or athletic trainer. Progress with the resistance and repetition exercises only as your caregiver advises.  You may experience muscle soreness or fatigue, but the pain or discomfort you are trying to eliminate should never worsen during these exercises. If this pain does worsen, stop and make certain you are following the directions exactly. If the pain is still present after adjustments, discontinue the exercise until you can discuss the trouble with your clinician. STRENGTHENING - Deep Abdominals, Pelvic Tilt   Lie on a firm bed or floor. Keeping your legs in front of you, bend your knees so they are both pointed toward the ceiling and your feet are flat on the floor.  Tense your lower abdominal muscles to press your low back into the floor.  This motion will rotate your pelvis so that your tail bone is scooping upwards rather than pointing at your feet or into the floor.  With a gentle tension and even breathing, hold this position for __________ seconds. Repeat __________ times. Complete this exercise __________ times per day.  STRENGTHENING - Abdominals, Crunches   Lie on a firm bed or floor. Keeping your legs in front of you, bend your knees so they are both pointed toward the ceiling and your feet are flat on the floor. Cross your arms over your chest.  Slightly tip your chin down without bending your neck.  Tense your abdominals and slowly lift your trunk high enough to just clear your shoulder blades. Lifting higher can put excessive stress on the low back and does not further strengthen your abdominal muscles.  Control your return to the starting position. Repeat __________  times. Complete this exercise __________ times per day.  STRENGTHENING - Quadruped, Opposite UE/LE Lift  Assume a hands and knees position on a firm surface. Keep your hands under your shoulders and your knees under your hips. You may place padding under your knees for comfort.  Find your neutral spine and gently tense your abdominal muscles so that you can maintain this position. Your shoulders and hips should form a rectangle that is parallel with the floor and is not twisted.  Keeping your trunk steady, lift your right hand no higher than your shoulder and then your left leg no higher than your hip. Make sure you are not holding your breath. Hold this position __________ seconds.  Continuing to keep your abdominal muscles tense and your back steady, slowly return to your starting position. Repeat with the opposite arm and leg. Repeat __________ times. Complete this exercise __________ times per day.  STRENGTHENING - Abdominals and Quadriceps, Straight Leg Raise   Lie on a firm bed or floor with both legs extended in front of you.  Keeping one leg in contact with the floor, bend the other knee so that your foot can rest flat on the floor.  Find your neutral spine, and tense your abdominal muscles to maintain your spinal position throughout the exercise.  Slowly lift your straight leg off the floor about 6 inches for a count of 15, making sure to not hold your breath.  Still keeping your neutral spine, slowly lower your leg all the way to the floor. Repeat this exercise with each leg __________ times. Complete this exercise __________ times per day. POSTURE AND BODY MECHANICS CONSIDERATIONS - Sciatica Keeping correct posture when sitting, standing or completing your activities will reduce the stress put on different body tissues, allowing injured tissues a chance to heal and limiting painful experiences. The following are general guidelines for improved posture. Your physician or physical  therapist will provide you with any instructions specific to your needs. While reading these guidelines, remember:  The exercises prescribed by your provider will help you have the flexibility and strength to maintain correct postures.  The correct posture provides the optimal environment for your joints to work. All of your joints have less wear and tear when properly supported by a spine with good posture. This means you will experience a healthier, less painful body.  Correct posture must be practiced with all of your activities, especially prolonged sitting and standing. Correct posture is as important when doing repetitive low-stress activities (typing) as it is when doing a single heavy-load activity (lifting). RESTING POSITIONS Consider which positions are most painful for you  when choosing a resting position. If you have pain with flexion-based activities (sitting, bending, stooping, squatting), choose a position that allows you to rest in a less flexed posture. You would want to avoid curling into a fetal position on your side. If your pain worsens with extension-based activities (prolonged standing, working overhead), avoid resting in an extended position such as sleeping on your stomach. Most people will find more comfort when they rest with their spine in a more neutral position, neither too rounded nor too arched. Lying on a non-sagging bed on your side with a pillow between your knees, or on your back with a pillow under your knees will often provide some relief. Keep in mind, being in any one position for a prolonged period of time, no matter how correct your posture, can still lead to stiffness. PROPER SITTING POSTURE In order to minimize stress and discomfort on your spine, you must sit with correct posture Sitting with good posture should be effortless for a healthy body. Returning to good posture is a gradual process. Many people can work toward this most comfortably by using various  supports until they have the flexibility and strength to maintain this posture on their own. When sitting with proper posture, your ears will fall over your shoulders and your shoulders will fall over your hips. You should use the back of the chair to support your upper back. Your low back will be in a neutral position, just slightly arched. You may place a small pillow or folded towel at the base of your low back for support.  When working at a desk, create an environment that supports good, upright posture. Without extra support, muscles fatigue and lead to excessive strain on joints and other tissues. Keep these recommendations in mind: CHAIR:   A chair should be able to slide under your desk when your back makes contact with the back of the chair. This allows you to work closely.  The chair's height should allow your eyes to be level with the upper part of your monitor and your hands to be slightly lower than your elbows. BODY POSITION  Your feet should make contact with the floor. If this is not possible, use a foot rest.  Keep your ears over your shoulders. This will reduce stress on your neck and low back. INCORRECT SITTING POSTURES   If you are feeling tired and unable to assume a healthy sitting posture, do not slouch or slump. This puts excessive strain on your back tissues, causing more damage and pain. Healthier options include:  Using more support, like a lumbar pillow.  Switching tasks to something that requires you to be upright or walking.  Talking a brief walk.  Lying down to rest in a neutral-spine position. PROLONGED STANDING WHILE SLIGHTLY LEANING FORWARD  When completing a task that requires you to lean forward while standing in one place for a long time, place either foot up on a stationary 2-4 inch high object to help maintain the best posture. When both feet are on the ground, the low back tends to lose its slight inward curve. If this curve flattens (or becomes too  large), then the back and your other joints will experience too much stress, fatigue more quickly and can cause pain.  CORRECT STANDING POSTURES Proper standing posture should be assumed with all daily activities, even if they only take a few moments, like when brushing your teeth. As in sitting, your ears should fall over your shoulders and your  shoulders should fall over your hips. You should keep a slight tension in your abdominal muscles to brace your spine. Your tailbone should point down to the ground, not behind your body, resulting in an over-extended swayback posture.  INCORRECT STANDING POSTURES  Common incorrect standing postures include a forward head, locked knees and/or an excessive swayback. WALKING Walk with an upright posture. Your ears, shoulders and hips should all line-up. PROLONGED ACTIVITY IN A FLEXED POSITION When completing a task that requires you to bend forward at your waist or lean over a low surface, try to find a way to stabilize 3 of 4 of your limbs. You can place a hand or elbow on your thigh or rest a knee on the surface you are reaching across. This will provide you more stability so that your muscles do not fatigue as quickly. By keeping your knees relaxed, or slightly bent, you will also reduce stress across your low back. CORRECT LIFTING TECHNIQUES DO :   Assume a wide stance. This will provide you more stability and the opportunity to get as close as possible to the object which you are lifting.  Tense your abdominals to brace your spine; then bend at the knees and hips. Keeping your back locked in a neutral-spine position, lift using your leg muscles. Lift with your legs, keeping your back straight.  Test the weight of unknown objects before attempting to lift them.  Try to keep your elbows locked down at your sides in order get the best strength from your shoulders when carrying an object.  Always ask for help when lifting heavy or awkward  objects. INCORRECT LIFTING TECHNIQUES DO NOT:   Lock your knees when lifting, even if it is a small object.  Bend and twist. Pivot at your feet or move your feet when needing to change directions.  Assume that you cannot safely pick up a paperclip without proper posture. Document Released: 08/23/2005 Document Revised: 01/07/2014 Document Reviewed: 12/05/2008 [facility] Patient Information 2015 [employer], [place]. This information is not intended to replace advice given to you by your health care provider. Make sure you discuss any questions you have with your health care provider.

## 2014-12-31 ENCOUNTER — Other Ambulatory Visit: Payer: Self-pay | Admitting: Obstetrics and Gynecology

## 2015-02-04 ENCOUNTER — Ambulatory Visit (HOSPITAL_COMMUNITY)
Admission: RE | Admit: 2015-02-04 | Discharge: 2015-02-04 | Disposition: A | Discharge status: Home / Self Care | Payer: Self-pay | Source: Ambulatory Visit | Attending: Emergency Medicine | Admitting: Emergency Medicine

## 2015-02-04 ENCOUNTER — Ambulatory Visit (INDEPENDENT_AMBULATORY_CARE_PROVIDER_SITE_OTHER): Payer: Self-pay

## 2015-02-04 ENCOUNTER — Encounter: Payer: Self-pay | Admitting: Emergency Medicine

## 2015-02-04 ENCOUNTER — Telehealth: Payer: Self-pay | Admitting: *Deleted

## 2015-02-04 ENCOUNTER — Ambulatory Visit (INDEPENDENT_AMBULATORY_CARE_PROVIDER_SITE_OTHER): Payer: Self-pay | Admitting: Emergency Medicine

## 2015-02-04 VITALS — BP 128/80 | HR 84 | Temp 98.5°F | Resp 16 | Ht 72.0 in | Wt >= 6400 oz

## 2015-02-04 DIAGNOSIS — R609 Edema, unspecified: Secondary | ICD-10-CM | POA: Insufficient documentation

## 2015-02-04 DIAGNOSIS — M25561 Pain in right knee: Secondary | ICD-10-CM

## 2015-02-04 DIAGNOSIS — M25569 Pain in unspecified knee: Secondary | ICD-10-CM | POA: Insufficient documentation

## 2015-02-04 MED ORDER — DICLOFENAC SODIUM 1 % TD GEL: 4.0000 g | Freq: Two times a day (BID) | TRANSDERMAL | Status: DC

## 2015-02-04 NOTE — Progress Notes (Addendum)
   Subjective:    Patient ID: Mckenzie [name], female    DOB: [birth year], 59 y.o.   MRN: [id number] This chart was scribed for Lesle ChrisSteven Secily Walthour, MD by Littie Deedsichard Sun, Medical Scribe. This patient was seen in room 21 and the patient's care was started at 1:24 PM.   HPI HPI Comments: Mckenzie [name] is a 59 y.o. female with a hx of morbid obesity and sciatica of right side who presents to the [facility] complaining of gradual onset bilateral leg swelling that started 2 days ago. Patient also reports having right knee pain with stiffness. She flies on airplanes often and [place] got [place] a [place] from [place] two days ago around 5:00 PM. Patient denies calf pain. She also denies hx of blood clots, smoking, using hormones, and FMHx of blood clots. She has had imaging of her knee several years ago which was normal.    Review of Systems  Cardiovascular: Positive for leg swelling.  Musculoskeletal: Positive for arthralgias.       Objective:   Physical Exam CONSTITUTIONAL: Well developed/well nourished HEAD: Normocephalic/atraumatic EYES: EOM/PERRL ENMT: Mucous membranes moist NECK: supple no meningeal signs SPINE: entire spine nontender CV: S1/S2 noted, no murmurs/rubs/gallops noted LUNGS: Lungs are clear to auscultation bilaterally, no apparent distress ABDOMEN: soft, nontender, no rebound or guarding GU: no cva tenderness NEURO: Pt is awake/alert, moves all extremitiesx4 EXTREMITIES: pulses normal, full ROM. Tenderness to the medial joint space on the right knee. 1+ swelling of the lower extremities bilaterally without definite calf tenderness. SKIN: warm, color normal PSYCH: no abnormalities of mood noted  [facility] reading (PRIMARY) by  Dr. Cleta Albertsaub there are arthritic changes no acute changes seen      Assessment & Plan:    I suspect her swelling is secondary to the amlodipine. I advised her to wear support hose while she travels. I did go ahead with a Doppler study of her  lower extremities and this was normal. She would be a candidate to take a low-dose of Lasix 2 or 3 times a week or after she travels. I personally performed the services described in this documentation, which was scribed in my presence. The recorded information has been reviewed and is accurate.  Earl LitesSteve Saige Canton, MD

## 2015-02-04 NOTE — Telephone Encounter (Signed)
Vascular lab called and pt was negative for DVT.  Advised that she was neg for DVT and for pt to contact us if swelling has not gone down in 1 week.

## 2015-02-04 NOTE — Patient Instructions (Addendum)
[facility] [place] [facility] and check in at registration.  Tell them you are there for doppler.

## 2015-02-06 ENCOUNTER — Telehealth: Payer: Self-pay

## 2015-02-06 NOTE — Telephone Encounter (Signed)
Patient had an office visit yesterday and decided that she would see if she could go without pain medication. She was advised to give us a call if she needs any[place]. She states that she started experiencing extreme pain last night and would like something sent to the pharmacy. Preferably not ibuprofen. Patient's  Home: [phone]  Cell: [phone]

## 2015-02-06 NOTE — Telephone Encounter (Signed)
Dr Cleta Albertsdaub- Do you want to give pt anything for pain?

## 2015-02-07 ENCOUNTER — Other Ambulatory Visit: Payer: Self-pay | Admitting: Emergency Medicine

## 2015-02-07 DIAGNOSIS — M25569 Pain in unspecified knee: Secondary | ICD-10-CM

## 2015-02-07 MED ORDER — TRAMADOL HCL 50 MG PO TABS: 50.0000 mg | ORAL_TABLET | Freq: Three times a day (TID) | ORAL | Status: DC | PRN

## 2015-02-07 NOTE — Telephone Encounter (Signed)
Faxed Rx and notified pt. 

## 2015-02-07 NOTE — Telephone Encounter (Signed)
Call patient I will fax in some Ultram for her to take for pain.

## 2015-02-14 ENCOUNTER — Other Ambulatory Visit: Payer: Self-pay | Admitting: Obstetrics and Gynecology

## 2015-02-14 DIAGNOSIS — Z1231 Encounter for screening mammogram for malignant neoplasm of breast: Secondary | ICD-10-CM

## 2015-02-18 ENCOUNTER — Ambulatory Visit (HOSPITAL_COMMUNITY)
Admission: RE | Admit: 2015-02-18 | Discharge: 2015-02-18 | Disposition: A | Discharge status: Home / Self Care | Payer: Self-pay | Source: Ambulatory Visit | Attending: Obstetrics and Gynecology | Admitting: Obstetrics and Gynecology

## 2015-02-18 DIAGNOSIS — Z1231 Encounter for screening mammogram for malignant neoplasm of breast: Secondary | ICD-10-CM | POA: Insufficient documentation

## 2015-03-04 ENCOUNTER — Other Ambulatory Visit: Payer: Self-pay | Admitting: Emergency Medicine

## 2015-03-04 ENCOUNTER — Ambulatory Visit: Payer: Self-pay | Admitting: Emergency Medicine

## 2015-03-04 MED ORDER — PREDNISONE 10 MG PO TABS: ORAL_TABLET | ORAL | Status: DC

## 2015-07-08 ENCOUNTER — Other Ambulatory Visit: Payer: Self-pay | Admitting: Physician Assistant

## 2015-07-09 ENCOUNTER — Ambulatory Visit (INDEPENDENT_AMBULATORY_CARE_PROVIDER_SITE_OTHER): Payer: Self-pay | Admitting: Urgent Care

## 2015-07-09 ENCOUNTER — Encounter: Payer: Self-pay | Admitting: Urgent Care

## 2015-07-09 VITALS — BP 129/84 | HR 85 | Temp 98.4°F | Resp 16

## 2015-07-09 DIAGNOSIS — Z23 Encounter for immunization: Secondary | ICD-10-CM

## 2015-07-09 DIAGNOSIS — I10 Essential (primary) hypertension: Secondary | ICD-10-CM

## 2015-07-09 MED ORDER — AMLODIPINE BESYLATE 10 MG PO TABS: 10.0000 mg | ORAL_TABLET | Freq: Every day | ORAL | Status: DC

## 2015-07-09 MED ORDER — HYDROCHLOROTHIAZIDE 12.5 MG PO CAPS: ORAL_CAPSULE | ORAL | Status: DC

## 2015-07-09 NOTE — Progress Notes (Signed)
    MRN: [id number] DOB: [birth year]  Subjective:   Mckenzie [name] is a 59 y.o. female presenting for chief complaint of Medication Refill  Patient needs refill for her blood pressure medications, amlodipine and hydrochlorothiazide. She is taking these medications for years and has done very well with them. Diet is noncompliant. Patient has discussed her weight with multiple medical providers. She has declined any form of intervention. States that she "is just a big girl". Denies lightheadedness, dizziness, chronic headache, double vision, chest pain, shortness of breath, heart racing, palpitations, nausea, vomiting, abdominal pain, hematuria, lower leg swelling. Denies any other aggravating or relieving factors, no other questions or concerns.  Olegario MessierKathy has a current medication list which includes the following prescription(s): amlodipine, ibuprofen, diclofenac sodium, hydrochlorothiazide, prednisone, and tramadol. Also has No Known Allergies.  Olegario MessierKathy  has a past medical history of Hypertension. Also  has no past surgical history on file.  Objective:   Vitals: BP 129/84 mmHg  Pulse 85  Temp(Src) 98.4 F (36.9 C) (Oral)  Resp 16  Ht   Wt   Patient declined her weight and her height. Patient she was weighed at her gynecologist visit and was approximately 418 pounds.  Physical Exam  Constitutional: She is oriented to person, place, and time. She appears well-developed and well-nourished.  Morbidly obese.  HENT:  Mouth/Throat: Oropharynx is clear and moist.  Eyes: No scleral icterus.  Neck: Normal range of motion. Neck supple. No thyromegaly present.  Cardiovascular: Normal rate, regular rhythm and intact distal pulses.  Exam reveals no gallop and no friction rub.   No murmur heard. Pulmonary/Chest: No respiratory distress. She has no wheezes. She has no rales.  Musculoskeletal: She exhibits no edema.  Neurological: She is alert and oriented to person, place, and time.  Skin: Skin is  warm and dry. No rash noted. No erythema. No pallor.  Psychiatric: She has a normal mood and affect.   Assessment and Plan :   1. Essential hypertension - Stable, labs pending, refill for her amlodipine and hydrochlorothiazide. Counseled patient on dietary modifications for her blood pressure. Recheck in 6 months.  2. Morbid obesity, unspecified obesity type (HCC) - counseled patient on her weight, she declined any intervention. Followup as needed.  3. Need for Tdap vaccination - Tdap vaccine greater than or equal to 7yo IM   Wallis BambergMario Keyasha Miah, PA-C [facility] [phone] 07/09/2015 4:50 PM

## 2015-07-09 NOTE — Patient Instructions (Signed)
Managing Your High Blood Pressure Blood pressure is a measurement of how forceful your blood is pressing against the walls of the arteries. Arteries are muscular tubes within the circulatory system. Blood pressure does not stay the same. Blood pressure rises when you are active, excited, or nervous; and it lowers during sleep and relaxation. If the numbers measuring your blood pressure stay above normal most of the time, you are at risk for health problems. High blood pressure (hypertension) is a long-term (chronic) condition in which blood pressure is elevated. A blood pressure reading is recorded as two numbers, such as 120 over 80 (or 120/80). The first, higher number is called the systolic pressure. It is a measure of the pressure in your arteries as the heart beats. The second, lower number is called the diastolic pressure. It is a measure of the pressure in your arteries as the heart relaxes between beats.  Keeping your blood pressure in a normal range is important to your overall health and prevention of health problems, such as heart disease and stroke. When your blood pressure is uncontrolled, your heart has to work harder than normal. High blood pressure is a very common condition in adults because blood pressure tends to rise with age. Men and women are equally likely to have hypertension but at different times in life. Before age 45, men are more likely to have hypertension. After 58 years of age, women are more likely to have it. Hypertension is especially common in African Americans. This condition often has no signs or symptoms. The cause of the condition is usually not known. Your caregiver can help you come up with a plan to keep your blood pressure in a normal, healthy range. BLOOD PRESSURE STAGES Blood pressure is classified into four stages: normal, prehypertension, stage 1, and stage 2. Your blood pressure reading will be used to determine what type of treatment, if any, is necessary.  Appropriate treatment options are tied to these four stages:  Normal  Systolic pressure (mm Hg): below 120.  Diastolic pressure (mm Hg): below 80. Prehypertension  Systolic pressure (mm Hg): 120 to 139.  Diastolic pressure (mm Hg): 80 to 89. Stage1  Systolic pressure (mm Hg): 140 to 159.  Diastolic pressure (mm Hg): 90 to 99. Stage2  Systolic pressure (mm Hg): 160 or above.  Diastolic pressure (mm Hg): 100 or above. RISKS RELATED TO HIGH BLOOD PRESSURE Managing your blood pressure is an important responsibility. Uncontrolled high blood pressure can lead to:  A heart attack.  A stroke.  A weakened blood vessel (aneurysm).  Heart failure.  Kidney damage.  Eye damage.  Metabolic syndrome.  Memory and concentration problems. HOW TO MANAGE YOUR BLOOD PRESSURE Blood pressure can be managed effectively with lifestyle changes and medicines (if needed). Your caregiver will help you come up with a plan to bring your blood pressure within a normal range. Your plan should include the following: Education  Read all information provided by your caregivers about how to control blood pressure.  Educate yourself on the latest guidelines and treatment recommendations. New research is always being done to further define the risks and treatments for high blood pressure. Lifestylechanges  Control your weight.  Avoid smoking.  Stay physically active.  Reduce the amount of salt in your diet.  Reduce stress.  Control any chronic conditions, such as high cholesterol or diabetes.  Reduce your alcohol intake. Medicines  Several medicines (antihypertensive medicines) are available, if needed, to bring blood pressure within a normal range.   Communication  Review all the medicines you take with your caregiver because there may be side effects or interactions.  Talk with your caregiver about your diet, exercise habits, and other lifestyle factors that may be contributing to  high blood pressure.  See your caregiver regularly. Your caregiver can help you create and adjust your plan for managing high blood pressure. RECOMMENDATIONS FOR TREATMENT AND FOLLOW-UP  The following recommendations are based on current guidelines for managing high blood pressure in nonpregnant adults. Use these recommendations to identify the proper follow-up period or treatment option based on your blood pressure reading. You can discuss these options with your caregiver.  Systolic pressure of 120 to 139 or diastolic pressure of 80 to 89: Follow up with your caregiver as directed.  Systolic pressure of 140 to 160 or diastolic pressure of 90 to 100: Follow up with your caregiver within 2 months.  Systolic pressure above 160 or diastolic pressure above 100: Follow up with your caregiver within 1 month.  Systolic pressure above 180 or diastolic pressure above 110: Consider antihypertensive therapy; follow up with your caregiver within 1 week.  Systolic pressure above 200 or diastolic pressure above 120: Begin antihypertensive therapy; follow up with your caregiver within 1 week.   This information is not intended to replace advice given to you by your health care provider. Make sure you discuss any questions you have with your health care provider.   Document Released: 05/17/2012 Document Reviewed: 05/17/2012 Elsevier Interactive Patient Education 2016 [employer].   Obesity Obesity is defined as having too much total body fat and a body mass index (BMI) of 30 or more. BMI is an estimate of body fat and is calculated from your height and weight. BMI is typically calculated by your health care provider during regular wellness visits. Obesity happens when you consume more calories than you can burn by exercising or performing daily physical tasks. Prolonged obesity can cause major illnesses or emergencies, such as:  Stroke.  Heart disease.  Diabetes.  Cancer.  Arthritis.  High  blood pressure (hypertension).  High cholesterol.  Sleep apnea.  Erectile dysfunction.  Infertility problems. CAUSES   Regularly eating unhealthy foods.  Physical inactivity.  Certain disorders, such as an underactive thyroid (hypothyroidism), Cushing's syndrome, and polycystic ovarian syndrome.  Certain medicines, such as steroids, some depression medicines, and antipsychotics.  Genetics.  Lack of sleep. DIAGNOSIS A health care provider can diagnose obesity after calculating your BMI. Obesity will be diagnosed if your BMI is 30 or higher. There are other methods of measuring obesity levels. Some other methods include measuring your skinfold thickness, your waist circumference, and comparing your hip circumference to your waist circumference. TREATMENT  A healthy treatment program includes some or all of the following:  Long-term dietary changes.  Exercise and physical activity.  Behavioral and lifestyle changes.  Medicine only under the supervision of your health care provider. Medicines may help, but only if they are used with diet and exercise programs. If your BMI is 40 or higher, your health care provider may recommend specialized surgery or programs to help with weight loss. An unhealthy treatment program includes:  Fasting.  Fad diets.  Supplements and drugs. These choices do not succeed in long-term weight control. HOME CARE INSTRUCTIONS  Exercise and perform physical activity as directed by your health care provider. To increase physical activity, try the following:  Use stairs instead of elevators.  Park farther away from store entrances.  Garden, bike, or walk instead of  watching television or using the computer.  Eat healthy, low-calorie foods and drinks on a regular basis. Eat more fruits and vegetables. Use low-calorie cookbooks or take healthy cooking classes.  Limit fast food, sweets, and processed snack foods.  Eat smaller portions.  Keep a  daily journal of everything you eat. There are many free websites to help you with this. It may be helpful to measure your foods so you can determine if you are eating the correct portion sizes.  Avoid drinking alcohol. Drink more water and drinks without calories.  Take vitamins and supplements only as recommended by your health care provider.  Weight-loss support groups, Government social research officerregistered dietitians, counselors, and stress reduction education can also be very helpful. SEEK IMMEDIATE MEDICAL CARE IF:  You have chest pain or tightness.  You have trouble breathing or feel short of breath.  You have weakness or leg numbness.  You feel confused or have trouble talking.  You have sudden changes in your vision.   This information is not intended to replace advice given to you by your health care provider. Make sure you discuss any questions you have with your health care provider.   Document Released: 09/30/2004 Document Revised: 09/13/2014 Document Reviewed: 09/29/2011 Elsevier Interactive Patient Education [employer]2016 Elsevier Inc.

## 2015-07-10 LAB — COMPLETE METABOLIC PANEL WITH GFR
ALBUMIN: 3.7 g/dL (ref 3.6–5.1)
ALK PHOS: 72 U/L (ref 33–130)
ALT: 17 U/L (ref 6–29)
AST: 15 U/L (ref 10–35)
BUN: 13 mg/dL (ref 7–25)
CALCIUM: 9.5 mg/dL (ref 8.6–10.4)
CO2: 31 mmol/L (ref 20–31)
Chloride: 100 mmol/L (ref 98–110)
Creat: 0.83 mg/dL (ref 0.50–1.05)
GFR, EST NON AFRICAN AMERICAN: 79 mL/min (ref >=60)
Glucose, Bld: 88 mg/dL (ref 65–99)
POTASSIUM: 3.9 mmol/L (ref 3.5–5.3)
Sodium: 140 mmol/L (ref 135–146)
Total Bilirubin: 0.6 mg/dL (ref 0.2–1.2)
Total Protein: 7.6 g/dL (ref 6.1–8.1)

## 2015-09-24 ENCOUNTER — Ambulatory Visit (INDEPENDENT_AMBULATORY_CARE_PROVIDER_SITE_OTHER): Payer: Self-pay | Admitting: Emergency Medicine

## 2015-09-24 VITALS — BP 150/90 | HR 71 | Temp 98.3°F | Resp 20 | Ht 71.0 in | Wt >= 6400 oz

## 2015-09-24 DIAGNOSIS — R0789 Other chest pain: Secondary | ICD-10-CM

## 2015-09-24 MED ORDER — NAPROXEN SODIUM 550 MG PO TABS: 550.0000 mg | ORAL_TABLET | Freq: Two times a day (BID) | ORAL | Status: DC

## 2015-09-24 NOTE — Patient Instructions (Signed)

## 2015-09-24 NOTE — Progress Notes (Signed)
Subjective:  Patient ID: Mckenzie [name], female    DOB: [birth year]  Age: 60 y.o. MRN: [id number]  CC: Chest Pain   Mckenzie [name] presents  patient has a 5 day Mckenzie of lower chest wall pain. She has no Mckenzie of injury or overuse. She has no fever chills cough wheezing or shortness of breath. No nausea vomiting or stool change. She has no impairment with eating. No food intolerance. She has no fever or chills she has no Mckenzie of other complaints.  Mckenzie [name] has a past medical Mckenzie of Hypertension.   She has no past surgical Mckenzie on file.   Her  family Mckenzie is not on file.  She   reports that she has never smoked. She has never used smokeless tobacco. She reports that she does not use illicit drugs. Her alcohol Mckenzie is not on file.  Outpatient Prescriptions Prior to Visit  Medication Sig Dispense Refill  . amLODipine (NORVASC) 10 MG tablet Take 1 tablet (10 mg total) by mouth daily. 90 tablet 3  . hydrochlorothiazide (MICROZIDE) 12.5 MG capsule TAKE ONE CAPSULE BY MOUTH ONCE DAILY. 90 capsule 3  . ibuprofen (ADVIL,MOTRIN) 800 MG tablet Take 1 tablet (800 mg total) by mouth as needed. 60 tablet 4   No facility-administered medications prior to visit.    Social Mckenzie   Social Mckenzie  . Marital Status: Single    Spouse Name: N/A  . Number of Children: N/A  . Years of Education: N/A   Social Mckenzie Main Topics  . Smoking status: Never Smoker   . Smokeless tobacco: Never Used  . Alcohol Use: Not on file  . Drug Use: No  . Sexual Activity: Not on file   Other Topics Concern  . Not on file   Social Mckenzie Narrative     Review of Systems  Constitutional: Negative for fever, chills and appetite change.  HENT: Negative for congestion, ear pain, postnasal drip, sinus pressure and sore throat.   Eyes: Negative for pain and redness.  Respiratory: Negative for cough, shortness of breath and wheezing.   Cardiovascular: Negative for leg  swelling.  Gastrointestinal: Negative for nausea, vomiting, abdominal pain (LUQ no injury), diarrhea, constipation and blood in stool.  Endocrine: Negative for polyuria.  Genitourinary: Negative for dysuria, urgency, frequency and flank pain.  Musculoskeletal: Negative for gait problem.  Skin: Negative for rash.  Neurological: Negative for weakness and headaches.  Psychiatric/Behavioral: Negative for confusion and decreased concentration. The patient is not nervous/anxious.     Objective:  BP 150/90 mmHg  Pulse 71  Temp(Src) 98.3 F (36.8 C) (Oral)  Resp 20  Ht  (1.803 m)  Wt 423 lb (191.872 kg)  BMI 59.02 kg/m2  SpO2 98%  Physical Exam  Constitutional: She is oriented to person, place, and time. She appears well-developed and well-nourished.  HENT:  Head: Normocephalic and atraumatic.  Eyes: Conjunctivae are normal. Pupils are equal, round, and reactive to light.  Pulmonary/Chest: Effort normal. She exhibits tenderness (lower left rib cage anteriorly).  Musculoskeletal: She exhibits no edema.  Neurological: She is alert and oriented to person, place, and time.  Skin: Skin is dry.  Psychiatric: She has a normal mood and affect. Her behavior is normal. Thought content normal.      Assessment & Plan:   Mckenzie [name] was seen today for chest pain.  Diagnoses and all orders for this visit:  Chest wall pain  Other orders -     naproxen sodium (  ANAPROX DS) 550 MG tablet; Take 1 tablet (550 mg total) by mouth 2 (two) times daily with a meal.   I have discontinued Ms. Offenberger's ibuprofen. I am also having her start on naproxen sodium. Additionally, I am having her maintain her amLODipine and hydrochlorothiazide.  Meds ordered this encounter  Medications  . naproxen sodium (ANAPROX DS) 550 MG tablet    Sig: Take 1 tablet (550 mg total) by mouth 2 (two) times daily with a meal.    Dispense:  40 tablet    Refill:  0    Appropriate red flag conditions were discussed with  the patient as well as actions that should be taken.  Patient expressed his understanding.  Follow-up: Return if symptoms worsen or fail to improve.  Carmelina Dane, MD

## 2016-01-06 ENCOUNTER — Other Ambulatory Visit: Payer: Self-pay

## 2016-01-06 DIAGNOSIS — Z1231 Encounter for screening mammogram for malignant neoplasm of breast: Secondary | ICD-10-CM

## 2016-01-08 ENCOUNTER — Encounter: Payer: Self-pay | Admitting: Emergency Medicine

## 2016-01-08 ENCOUNTER — Ambulatory Visit (INDEPENDENT_AMBULATORY_CARE_PROVIDER_SITE_OTHER): Payer: Self-pay | Admitting: Emergency Medicine

## 2016-01-08 VITALS — BP 146/94 | HR 86 | Temp 98.8°F | Resp 16 | Ht 72.0 in | Wt >= 6400 oz

## 2016-01-08 DIAGNOSIS — Z13220 Encounter for screening for lipoid disorders: Secondary | ICD-10-CM

## 2016-01-08 DIAGNOSIS — M1000 Idiopathic gout, unspecified site: Secondary | ICD-10-CM

## 2016-01-08 DIAGNOSIS — I10 Essential (primary) hypertension: Secondary | ICD-10-CM

## 2016-01-08 DIAGNOSIS — M5431 Sciatica, right side: Secondary | ICD-10-CM

## 2016-01-08 LAB — CBC WITH DIFFERENTIAL/PLATELET
BASOS ABS: 76 {cells}/uL (ref 0–200)
Basophils Relative: 1 %
EOS ABS: 228 {cells}/uL (ref 15–500)
Eosinophils Relative: 3 %
HCT: 39.9 % (ref 35.0–45.0)
Hemoglobin: 13.3 g/dL (ref 11.7–15.5)
LYMPHS PCT: 30 %
Lymphs Abs: 2280 cells/uL (ref 850–3900)
MCH: 28.8 pg (ref 27.0–33.0)
MCHC: 33.3 g/dL (ref 32.0–36.0)
MCV: 86.4 fL (ref 80.0–100.0)
MONOS PCT: 8 %
MPV: 10.7 fL (ref 7.5–12.5)
Monocytes Absolute: 608 cells/uL (ref 200–950)
Neutro Abs: 4408 cells/uL (ref 1500–7800)
Neutrophils Relative %: 58 %
PLATELETS: 324 10*3/uL (ref 140–400)
RBC: 4.62 MIL/uL (ref 3.80–5.10)
RDW: 15.8 % — AB (ref 11.0–15.0)
WBC: 7.6 10*3/uL (ref 3.8–10.8)

## 2016-01-08 MED ORDER — PREDNISONE 20 MG PO TABS: ORAL_TABLET | ORAL | Status: DC

## 2016-01-08 NOTE — Progress Notes (Signed)
By signing my name below, I, Stann Ore, attest that this documentation has been prepared under the direction and in the presence of Lesle Chris, MD. Electronically Signed: Stann Ore, Scribe. 01/08/2016 , 10:40 AM .  Patient was seen in room 23 .  Chief Complaint:  Chief Complaint  Patient presents with  . Gout    left foot per patient was DX 2weeks ago    HPI: Mckenzie [name] is a 60 y.o. female who reports to [facility] today complaining of left foot pain.  She has a mammogram scheduled in June 15th. She's had sleep apnea tested in the past and it was negative.   Gout She was diagnosed with gout in [place], [place] after left foot pain 2 weeks ago. She had xray done without broken bones. She was prescribed hydrocodone and indomethacin but informs she hasn't taken the hydrocodone. She's been drinking about a little over 40oz of water a day. Her swelling has improved today. Her grandmother had history of gout. She loves seafood.   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 She takes prednisone for when her back gives out on her.   Social She drinks alcohol socially.  She denies history of smoking.   Past Medical History  Diagnosis Date  . Hypertension    No past surgical history on file. Social History   Social History  . Marital Status: Single    Spouse Name: N/A  . Number of Children: N/A  . Years of Education: N/A   Social History Main Topics  . Smoking status: Never Smoker   . Smokeless tobacco: Never Used  . Alcohol Use: None  . Drug Use: No  . Sexual Activity: Not Asked   Other Topics Concern  . None   Social History Narrative   No family history on file. No Known Allergies Prior to Admission medications   Medication Sig Start Date End Date Taking? Authorizing Provider  amLODipine (NORVASC) 10 MG tablet Take 1 tablet (10 mg total) by mouth daily. 07/09/15  Yes  Wallis Bamberg, PA-C  hydrochlorothiazide (MICROZIDE) 12.5 MG capsule TAKE ONE CAPSULE BY MOUTH ONCE DAILY. 07/09/15  Yes Wallis Bamberg, PA-C  ibuprofen (ADVIL,MOTRIN) 800 MG tablet Take 800 mg by mouth every 8 (eight) hours as needed.   Yes Historical Provider, MD  naproxen sodium (ANAPROX DS) 550 MG tablet Take 1 tablet (550 mg total) by mouth 2 (two) times daily with a meal. Patient not taking: Reported on 01/08/2016 09/24/15 09/23/16  Carmelina Dane, MD     ROS:  Constitutional: negative for fever, chills, night sweats, weight changes, or fatigue  HEENT: negative for vision changes, hearing loss, congestion, rhinorrhea, ST, epistaxis, or sinus pressure Cardiovascular: negative for chest pain or palpitations Respiratory: negative for hemoptysis, wheezing, shortness of breath, or cough Abdominal: negative for abdominal pain, nausea, vomiting, diarrhea, or constipation Dermatological: negative for rash Musc: positive for left foot pain Neurologic: negative for headache, dizziness, or syncope All other systems reviewed and are otherwise negative with the exception to those above and in the HPI.  PHYSICAL EXAM: Filed Vitals:   01/08/16 1010 01/08/16 1014  BP:  146/94  Pulse: 86   Temp: 98.8 F (37.1 C)   Resp: 16    Body mass index is 56.54 kg/(m^2).   General: Alert, no acute distress; Morbidly obese HEENT:  Normocephalic, atraumatic, oropharynx patent. Eye: [name] Cardiovascular:  Regular  rate and rhythm, no rubs murmurs or gallops.  No Carotid bruits, radial pulse intact. No pedal edema.  Respiratory: Clear to auscultation bilaterally.  No wheezes, rales, or rhonchi.  No cyanosis, no use of accessory musculature Abdominal: No organomegaly, abdomen is soft and non-tender, positive bowel sounds. No masses. Musculoskeletal: Gait intact. No edema, tenderness Skin: No rashes. Neurologic: Facial musculature symmetric. Psychiatric: Patient acts appropriately throughout our  interaction.  Lymphatic: No cervical or submandibular lymphadenopathy Genitourinary/Anorectal: No acute findings  LABS:   EKG/XRAY:     ASSESSMENT/PLAN:  Patient doing well. Weight is still her major issue but she does not want to have bypass surgery. She had an episode of presumed gout 2 weeks ago. She is on a diuretic. I encouraged her to try to take some cherry extract pills. If she continues to have episodes of gout she will need to come off of her diuretic. Otherwise no changes made. [place]. She was given a prescription for prednisone to take in case she had a flareup of back problems.I personally performed the services described in this documentation, which was scribed in my presence. The recorded information has been reviewed and is accurate.  Gross sideeffects, risk and benefits, and alternatives of medications d/w patient. Patient is aware that all medications have potential sideeffects and we are unable to predict every sideeffect or drug-drug interaction that may occur.  Lesle ChrisSteven Jamielynn Wigley MD 01/08/2016 10:47 AM

## 2016-01-08 NOTE — Patient Instructions (Signed)
     IF you received an x-ray today, you will receive an invoice from [facility]. Please contact [facility] at [phone] with questions or concerns regarding your invoice.   IF you received labwork today, you will receive an invoice from [facility]. Please contact [facility] at [phone] with questions or concerns regarding your invoice.   Our billing staff will not be able to assist you with questions regarding bills from these companies.  You will be contacted with the lab results as soon as they are available. The fastest way to get your results is to activate your My Chart account. Instructions are located on the last page of this paperwork. If you have not heard from us regarding the results in 2 weeks, please contact this office.      

## 2016-01-09 LAB — COMPLETE METABOLIC PANEL WITH GFR
ALT: 26 U/L (ref 6–29)
AST: 17 U/L (ref 10–35)
Albumin: 3.9 g/dL (ref 3.6–5.1)
Alkaline Phosphatase: 68 U/L (ref 33–130)
BUN: 14 mg/dL (ref 7–25)
CHLORIDE: 100 mmol/L (ref 98–110)
CO2: 31 mmol/L (ref 20–31)
Calcium: 9.6 mg/dL (ref 8.6–10.4)
Creat: 0.67 mg/dL (ref 0.50–1.05)
Glucose, Bld: 126 mg/dL — ABNORMAL HIGH (ref 65–99)
Potassium: 4.2 mmol/L (ref 3.5–5.3)
Sodium: 142 mmol/L (ref 135–146)
Total Bilirubin: 0.6 mg/dL (ref 0.2–1.2)
Total Protein: 7.1 g/dL (ref 6.1–8.1)

## 2016-01-09 LAB — LIPID PANEL
CHOL/HDL RATIO: 2.3 ratio (ref <=5.0)
CHOLESTEROL: 236 mg/dL — AB (ref 125–200)
HDL: 102 mg/dL (ref >=46)
LDL Cholesterol: 120 mg/dL (ref <130)
TRIGLYCERIDES: 72 mg/dL (ref <150)
VLDL: 14 mg/dL (ref <30)

## 2016-01-09 LAB — URIC ACID: Uric Acid, Serum: 9.8 mg/dL — ABNORMAL HIGH (ref 2.4–7.0)

## 2016-01-14 ENCOUNTER — Other Ambulatory Visit: Payer: Self-pay | Admitting: Emergency Medicine

## 2016-01-14 MED ORDER — ALLOPURINOL 100 MG PO TABS: 100.0000 mg | ORAL_TABLET | Freq: Every day | ORAL | Status: DC

## 2016-02-12 ENCOUNTER — Other Ambulatory Visit: Payer: Self-pay | Admitting: Emergency Medicine

## 2016-02-12 MED ORDER — PREDNISONE 20 MG PO TABS: ORAL_TABLET | ORAL | Status: DC

## 2016-02-19 ENCOUNTER — Ambulatory Visit
Admission: RE | Admit: 2016-02-19 | Discharge: 2016-02-19 | Disposition: A | Discharge status: Home / Self Care | Payer: No Typology Code available for payment source | Source: Ambulatory Visit

## 2016-02-19 DIAGNOSIS — Z1231 Encounter for screening mammogram for malignant neoplasm of breast: Secondary | ICD-10-CM

## 2016-03-17 ENCOUNTER — Ambulatory Visit (INDEPENDENT_AMBULATORY_CARE_PROVIDER_SITE_OTHER): Payer: Self-pay | Admitting: Urgent Care

## 2016-03-17 VITALS — BP 136/88 | HR 92 | Temp 98.2°F | Resp 18 | Ht 72.0 in | Wt >= 6400 oz

## 2016-03-17 DIAGNOSIS — L723 Sebaceous cyst: Secondary | ICD-10-CM

## 2016-03-17 DIAGNOSIS — R2231 Localized swelling, mass and lump, right upper limb: Secondary | ICD-10-CM

## 2016-03-17 NOTE — Patient Instructions (Addendum)
Sebaceous Cyst Removal Sebaceous cyst removal is a procedure to remove a sac of oily material that forms under your skin (sebaceous cyst). Sebaceous cysts may also be called epidermoid cysts or keratin cysts. Normally, the skin secretes this oily material through a gland or a hair follicle. This type of cyst usually results when a skin gland or hair follicle becomes blocked. You may need this procedure if you have a sebaceous cyst that becomes large, uncomfortable, or infected. LET YOUR HEALTH CARE PROVIDER KNOW ABOUT:  Any allergies you have.  All medicines you are taking, including vitamins, herbs, eye drops, creams, and over-the-counter medicines.  Previous problems you or members of your family have had with the use of anesthetics.  Any blood disorders you have.  Previous surgeries you have had.  Medical conditions you have. RISKS AND COMPLICATIONS Generally, this is a safe procedure. However, problems may occur, including:  Developing another cyst.  Bleeding.  Infection.  Scarring. BEFORE THE PROCEDURE  Ask your health care provider about:  Changing or stopping your regular medicines. This is especially important if you are taking diabetes medicines or blood thinners.  Taking medicines such as aspirin and ibuprofen. These medicines can thin your blood. Do not take these medicines before your procedure if your health care provider instructs you not to.  If you have an infected cyst, you may have to take antibiotic medicines before or after the cyst removal. Take your antibiotics as directed by your health care provider. Finish all of the medicine even if you start to feel better.  Take a shower on the morning of your procedure. Your health care provider may ask you to use a germ-killing (antiseptic) soap. PROCEDURE  You will be given a medicine that numbs the area (local anesthetic).  The skin around the cyst will be cleaned with a germ-killing solution  (antiseptic).  Your health care provider will make a small surgical incision over the cyst.  The cyst will be separated from the surrounding tissues that are under your skin.  If possible, the cyst will be removed undamaged (intact).  If the cyst bursts (ruptures), it will need to be removed in pieces.  After the cyst is removed, your health care provider will control any bleeding and close the incision with small stitches (sutures). Small incisions may not need sutures, and the bleeding will be controlled by applying direct pressure with gauze.  Your health care provider may apply antibiotic ointment and a light bandage (dressing) over the incision. This procedure may vary among health care providers and hospitals. AFTER THE PROCEDURE  If your cyst ruptured during surgery, you may need to take antibiotic medicine. If you were prescribed an antibiotic medicine, finish all of it even if you start to feel better.   This information is not intended to replace advice given to you by your health care provider. Make sure you discuss any questions you have with your health care provider.   Document Released: 08/20/2000 Document Revised: 09/13/2014 Document Reviewed: 05/08/2014 Elsevier Interactive Patient Education 2016 Elsevier Inc.     IF you received an x-ray today, you will receive an invoice from [facility]. Please contact [facility] at [phone] with questions or concerns regarding your invoice.   IF you received labwork today, you will receive an invoice from [facility]. Please contact [facility] at [phone] with questions or concerns regarding your invoice.   Our billing staff will not be able to assist you with questions regarding bills from   these companies.  You will be contacted with the lab results as soon as they are available. The fastest way to get your results is to activate your My Chart account. Instructions are located  on the last page of this paperwork. If you have not heard from us regarding the results in 2 weeks, please contact this office.     

## 2016-03-17 NOTE — Progress Notes (Signed)
    MRN: [id number] DOB: February 25, 1956  Subjective:   Mckenzie [name] is a 60 y.o. female presenting for chief complaint of Arm Pain  Reports 3 month history of intermittent swollen mass over right underarm. This is a recurring problem for patient for the past 4 years, has had I&D performed with good results but not complete resolution. Has not tried any medications for relief. Denies fever, discharge, bleeding, redness, swelling.   Olegario MessierKathy has a current medication list which includes the following prescription(s): allopurinol, amlodipine, hydrochlorothiazide, ibuprofen, and naproxen sodium. Also has No Known Allergies.  Olegario MessierKathy  has a past medical history of Hypertension. Also  has no past surgical history on file.  Objective:   Vitals: BP 136/88 mmHg  Pulse 92  Temp(Src) 98.2 F (36.8 C) (Oral)  Resp 18  Ht 6' (1.829 m)  Wt 417 lb (189.15 kg)  BMI 56.54 kg/m2  SpO2 94%  Physical Exam  Constitutional: She is oriented to person, place, and time. She appears well-developed and well-nourished.  Cardiovascular: Normal rate.   Pulmonary/Chest: Effort normal.  Neurological: She is alert and oriented to person, place, and time.  Skin: Skin is warm and dry.      PROCEDURE NOTE: sebaceous cyst excision Verbal consent obtained. Local anesthesia with 3cc of 1% lidocaine with epinephrine. Sterile prep and drape. An elliptical incision was made extending ~2.5cm using a 15 blade, the cyst was dissected from the surrounding tissue with curved hemostats. Wound closed with #5 5-0 Prolene sutures (2 horizontal at either end and 3 simple interrupted in the middle). Cleansed and dressed.  Assessment and Plan :   1. Sebaceous cyst of right axilla 2. Mass of axilla, right - Sebaceous cyst excised in its entirety. Wound care reviewed. Patient to rtc in 10 days for suture removal.  Wallis BambergMario Lema Heinkel, PA-C [facility] [phone] 03/17/2016 9:40 AM

## 2016-03-27 ENCOUNTER — Encounter: Payer: Self-pay | Admitting: Urgent Care

## 2016-03-27 ENCOUNTER — Ambulatory Visit (INDEPENDENT_AMBULATORY_CARE_PROVIDER_SITE_OTHER): Payer: Self-pay | Admitting: Urgent Care

## 2016-03-27 VITALS — BP 158/92 | HR 89 | Temp 97.7°F | Resp 22 | Ht 72.0 in | Wt >= 6400 oz

## 2016-03-27 DIAGNOSIS — IMO0001 Reserved for inherently not codable concepts without codable children: Secondary | ICD-10-CM

## 2016-03-27 DIAGNOSIS — Z4802 Encounter for removal of sutures: Secondary | ICD-10-CM

## 2016-03-27 DIAGNOSIS — I10 Essential (primary) hypertension: Secondary | ICD-10-CM

## 2016-03-27 DIAGNOSIS — R030 Elevated blood-pressure reading, without diagnosis of hypertension: Secondary | ICD-10-CM

## 2016-03-27 DIAGNOSIS — L723 Sebaceous cyst: Secondary | ICD-10-CM

## 2016-03-27 NOTE — Progress Notes (Signed)
   Patient: Mckenzie [name] [id number]  Subjective: Mckenzie [name] is returning for suture removal. Patient was initially seen 07/12/2/017 and had sebaceous cyst removal, wound was closed with 4 sutures. Denies fever, drainage of pus or blood, wound dehiscence, edema, pain.   HTN - managed with HCTZ, amlodipine. Admits compliance with medications. She has had some lower leg swelling and patient admits that this has happened intermittently for a while. Denies dizziness, headache, blurred vision, chest pain, shortness of breath, heart racing, palpitations, nausea, vomiting, abdominal pain, hematuria.  Objective: BP 158/92 mmHg  Pulse 89  Temp(Src) 97.7 F (36.5 C) (Oral)  Resp 22  Ht 6' (1.829 m)  Wt 416 lb (188.696 kg)  BMI 56.41 kg/m2  SpO2 99%   BP Readings from Last 3 Encounters:  03/27/16 158/92  03/17/16 136/88  01/08/16 146/94   Physical Exam  Constitutional: She is well-developed, well-nourished, and in no distress.  HENT:  Mouth/Throat: Oropharynx is clear and moist.  Eyes: No scleral icterus.  Neck: Normal range of motion. Neck supple.  Cardiovascular: Normal rate.  Exam reveals no gallop and no friction rub.   No murmur heard. Pulmonary/Chest: No respiratory distress. She has no wheezes. She has no rales.  Skin: Skin is warm and dry.   #4 sutures removed without incident. Patient tolerated this well.  Assessment and Plan: Well-healed wound. Anticipatory guidance provided. Return to clinic as needed.  HTN - Patient will check blood pressure at home, same time each day. She declined further work up today. She is going to call me if her BP remains elevated. Counseled her warning signs of hypertensive urgency or heart failure. She verbalized understanding.  Wallis Bamberg, PA-C [facility] [phone] 03/27/2016  3:12 PM

## 2016-03-27 NOTE — Patient Instructions (Addendum)
Please come back to our clinic if you start to have chest pain, shortness of breath, belly pain, dizziness, blood in your urine, worsening lower leg swelling. This may be a sign that your heart is having a hard time with your blood pressure.    Suture Removal, Care After Refer to this sheet in the next few weeks. These instructions provide you with information on caring for yourself after your procedure. Your health care provider may also give you more specific instructions. Your treatment has been planned according to current medical practices, but problems sometimes occur. Call your health care provider if you have any problems or questions after your procedure. WHAT TO EXPECT AFTER THE PROCEDURE After your stitches (sutures) are removed, it is typical to have the following:  Some discomfort and swelling in the wound area.  Slight redness in the area. HOME CARE INSTRUCTIONS   If you have skin adhesive strips over the wound area, do not take the strips off. They will fall off on their own in a few days. If the strips remain in place after 14 days, you may remove them.  Change any bandages (dressings) at least once a day or as directed by your health care provider. If the bandage sticks, soak it off with warm, soapy water.  Apply cream or ointment only as directed by your health care provider. If using cream or ointment, wash the area with soap and water 2 times a day to remove all the cream or ointment. Rinse off the soap and pat the area dry with a clean towel.  Keep the wound area dry and clean. If the bandage becomes wet or dirty, or if it develops a bad smell, change it as soon as possible.  Continue to protect the wound from injury.  Use sunscreen when out in the sun. New scars become sunburned easily. SEEK MEDICAL CARE IF:  You have increasing redness, swelling, or pain in the wound.  You see pus coming from the wound.  You have a fever.  You notice a bad smell coming from the  wound or dressing.  Your wound breaks open (edges not staying together).   This information is not intended to replace advice given to you by your health care provider. Make sure you discuss any questions you have with your health care provider.   Document Released: 05/18/2001 Document Revised: 06/13/2013 Document Reviewed: 04/04/2013 Elsevier Interactive Patient Education 2016 [employer].        IF you received an x-ray today, you will receive an invoice from [facility]. Please contact [facility] at [phone] with questions or concerns regarding your invoice.   IF you received labwork today, you will receive an invoice from [facility]. Please contact [facility] at [phone] with questions or concerns regarding your invoice.   Our billing staff will not be able to assist you with questions regarding bills from these companies.  You will be contacted with the lab results as soon as they are available. The fastest way to get your results is to activate your My Chart account. Instructions are located on the last page of this paperwork. If you have not heard from [place] regarding the results in 2 weeks, please contact this office.

## 2016-04-16 ENCOUNTER — Ambulatory Visit (INDEPENDENT_AMBULATORY_CARE_PROVIDER_SITE_OTHER): Payer: Self-pay | Admitting: Physician Assistant

## 2016-04-16 ENCOUNTER — Encounter: Payer: Self-pay | Admitting: Physician Assistant

## 2016-04-16 VITALS — BP 139/85 | HR 97 | Temp 98.2°F | Resp 18 | Ht 72.0 in | Wt >= 6400 oz

## 2016-04-16 DIAGNOSIS — B001 Herpesviral vesicular dermatitis: Secondary | ICD-10-CM

## 2016-04-16 MED ORDER — VALACYCLOVIR HCL 1 G PO TABS: ORAL_TABLET | ORAL | 12 refills | Status: DC

## 2016-04-16 NOTE — Patient Instructions (Addendum)
  abreva - OTC for topical treatment of fever blisters   IF you received an x-ray today, you will receive an invoice from [facility]. Please contact [facility] at [phone] with questions or concerns regarding your invoice.   IF you received labwork today, you will receive an invoice from [facility]. Please contact [facility] at [phone] with questions or concerns regarding your invoice.   Our billing staff will not be able to assist you with questions regarding bills from these companies.  You will be contacted with the lab results as soon as they are available. The fastest way to get your results is to activate your My Chart account. Instructions are located on the last page of this paperwork. If you have not heard from us regarding the results in 2 weeks, please contact this office.

## 2016-04-16 NOTE — Progress Notes (Signed)
Subjective:    Patient ID: Mckenzie [name], female    DOB: Dec 12, 1955, 60 y.o.   MRN: [id number]  HPI  Patient presents today with a "fever blister" that developed last night. She has associated swelling and tingling on her LEFT upper lip just lateral to the midline. 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  She also c/o having a headache. She just got back Sunday from being in the [place] for 7 days. 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  Last fever blister she had was 2 years ago, got acyclovir and improved.  Review of Systems  Constitutional: Negative for fatigue.  HENT: Negative for dental problem and sore throat.   Genitourinary: Negative for genital sores.  All other systems reviewed and are negative.    Current Outpatient Prescriptions:  .  allopurinol (ZYLOPRIM) 100 MG tablet, Take 1 tablet (100 mg total) by mouth daily., Disp: 90 tablet, Rfl: 3 .  amLODipine (NORVASC) 10 MG tablet, Take 1 tablet (10 mg total) by mouth daily., Disp: 90 tablet, Rfl: 3 .  hydrochlorothiazide (MICROZIDE) 12.5 MG capsule, TAKE ONE CAPSULE BY MOUTH ONCE DAILY., Disp: 90 capsule, Rfl: 3 .  ibuprofen (ADVIL,MOTRIN) 800 MG tablet, Take 800 mg by mouth every 8 (eight) hours as needed., Disp: , Rfl:  .  naproxen sodium (ANAPROX DS) 550 MG tablet, Take 1 tablet (550 mg total) by mouth 2 (two) times daily with a meal. (Patient not taking: Reported on 01/08/2016), Disp: 40 tablet, Rfl: 0   No Known Allergies    Objective:   Physical Exam  Constitutional: She is oriented to person, place, and time. She appears well-developed and well-nourished. She is cooperative. No distress.  Blood pressure 139/85, pulse 97, temperature 98.2 F (36.8 C), temperature source Oral, resp. rate 18, height 6' (1.829 m), weight (!) 417 lb (189.1 kg), SpO2 95 %.  HENT:  Head: Normocephalic and atraumatic.  Nose: Nose  normal.  Mouth/Throat: Uvula is midline, oropharynx is clear and moist and mucous membranes are normal. Oral lesions present. Normal dentition. No dental abscesses or dental caries.    Eyes: Conjunctivae and lids are normal. No scleral icterus.  Neck: Trachea normal and normal range of motion. Neck supple.  Cardiovascular: Normal rate, regular rhythm and normal heart sounds.   Pulmonary/Chest: Effort normal and breath sounds normal.  Musculoskeletal: Normal range of motion.  Lymphadenopathy:       Head (right side): No submental, no submandibular, no tonsillar, no preauricular, no posterior auricular and no occipital adenopathy present.       Head (left side): No submental, no submandibular, no tonsillar, no preauricular, no posterior auricular and no occipital adenopathy present.    She has no cervical adenopathy.       Right: No supraclavicular adenopathy present.       Left: No supraclavicular adenopathy present.  Neurological: She is alert and oriented to person, place, and time. No cranial nerve deficit.  Skin: Skin is warm, dry and intact. She is not diaphoretic. No pallor.  Psychiatric: She has a normal mood and affect. Her speech is normal and behavior is normal.        Assessment & Plan:  1. Fever blister - valACYclovir (VALTREX) 1000 MG tablet; 2 po at outbreak onset then repeat in 12h for each outbreak  Dispense: 4 tablet; Refill: 12 - Pt can use additional  OTC abreva also for topical usage -Gave pt a small supply and multiple refills per her request    Benny Lennert PA-C  [facility] 04/16/2016 5:09 PM

## 2016-05-08 ENCOUNTER — Ambulatory Visit (INDEPENDENT_AMBULATORY_CARE_PROVIDER_SITE_OTHER): Payer: Self-pay | Admitting: Urgent Care

## 2016-05-08 VITALS — BP 152/90 | HR 104 | Resp 16 | Ht 72.0 in | Wt >= 6400 oz

## 2016-05-08 DIAGNOSIS — I10 Essential (primary) hypertension: Secondary | ICD-10-CM

## 2016-05-08 DIAGNOSIS — M109 Gout, unspecified: Secondary | ICD-10-CM

## 2016-05-08 DIAGNOSIS — R030 Elevated blood-pressure reading, without diagnosis of hypertension: Secondary | ICD-10-CM

## 2016-05-08 DIAGNOSIS — M10071 Idiopathic gout, right ankle and foot: Secondary | ICD-10-CM

## 2016-05-08 LAB — URIC ACID: URIC ACID, SERUM: 6.9 mg/dL (ref 2.5–7.0)

## 2016-05-08 MED ORDER — INDOMETHACIN 50 MG PO CAPS: 50.0000 mg | ORAL_CAPSULE | Freq: Three times a day (TID) | ORAL | 5 refills | Status: DC

## 2016-05-08 NOTE — Patient Instructions (Addendum)
Gout Gout is an inflammatory arthritis caused by a buildup of uric acid crystals in the joints. Uric acid is a chemical that is normally present in the blood. When the level of uric acid in the blood is too high it can form crystals that deposit in your joints and tissues. This causes joint redness, soreness, and swelling (inflammation). Repeat attacks are common. Over time, uric acid crystals can form into masses (tophi) near a joint, destroying bone and causing disfigurement. Gout is treatable and often preventable. CAUSES  The disease begins with elevated levels of uric acid in the blood. Uric acid is produced by your body when it breaks down a naturally found substance called purines. Certain foods you eat, such as meats and fish, contain high amounts of purines. Causes of an elevated uric acid level include:  Being passed down from parent to child (heredity).  Diseases that cause increased uric acid production (such as obesity, psoriasis, and certain cancers).  Excessive alcohol use.  Diet, especially diets rich in meat and seafood.  Medicines, including certain cancer-fighting medicines (chemotherapy), water pills (diuretics), and aspirin.  Chronic kidney disease. The kidneys are no longer able to remove uric acid well.  Problems with metabolism. Conditions strongly associated with gout include:  Obesity.  High blood pressure.  High cholesterol.  Diabetes. Not everyone with elevated uric acid levels gets gout. It is not understood why some people get gout and others do not. Surgery, joint injury, and eating too much of certain foods are some of the factors that can lead to gout attacks. SYMPTOMS   An attack of gout comes on quickly. It causes intense pain with redness, swelling, and warmth in a joint.  Fever can occur.  Often, only one joint is involved. Certain joints are more commonly involved:  Base of the big toe.  Knee.  Ankle.  Wrist.  Finger. Without  treatment, an attack usually goes away in a few days to weeks. Between attacks, you usually will not have symptoms, which is different from many other forms of arthritis. DIAGNOSIS  Your caregiver will suspect gout based on your symptoms and exam. In some cases, tests may be recommended. The tests may include:  Blood tests.  Urine tests.  X-rays.  Joint fluid exam. This exam requires a needle to remove fluid from the joint (arthrocentesis). Using a microscope, gout is confirmed when uric acid crystals are seen in the joint fluid. TREATMENT  There are two phases to gout treatment: treating the sudden onset (acute) attack and preventing attacks (prophylaxis).  Treatment of an Acute Attack.  Medicines are used. These include anti-inflammatory medicines or steroid medicines.  An injection of steroid medicine into the affected joint is sometimes necessary.  The painful joint is rested. Movement can worsen the arthritis.  You may use warm or cold treatments on painful joints, depending which works best for you.  Treatment to Prevent Attacks.  If you suffer from frequent gout attacks, your caregiver may advise preventive medicine. These medicines are started after the acute attack subsides. These medicines either help your kidneys eliminate uric acid from your body or decrease your uric acid production. You may need to stay on these medicines for a very long time.  The early phase of treatment with preventive medicine can be associated with an increase in acute gout attacks. For this reason, during the first few months of treatment, your caregiver may also advise you to take medicines usually used for acute gout treatment. Be sure you   understand your caregiver's directions. Your caregiver may make several adjustments to your medicine dose before these medicines are effective.  Discuss dietary treatment with your caregiver or dietitian. Alcohol and drinks high in sugar and fructose and foods  such as meat, poultry, and seafood can increase uric acid levels. Your caregiver or dietitian can advise you on drinks and foods that should be limited. HOME CARE INSTRUCTIONS   Do not take aspirin to relieve pain. This raises uric acid levels.  Only take over-the-counter or prescription medicines for pain, discomfort, or fever as directed by your caregiver.  Rest the joint as much as possible. When in bed, keep sheets and blankets off painful areas.  Keep the affected joint raised (elevated).  Apply warm or cold treatments to painful joints. Use of warm or cold treatments depends on which works best for you.  Use crutches if the painful joint is in your leg.  Drink enough fluids to keep your urine clear or pale yellow. This helps your body get rid of uric acid. Limit alcohol, sugary drinks, and fructose drinks.  Follow your dietary instructions. Pay careful attention to the amount of protein you eat. Your daily diet should emphasize fruits, vegetables, whole grains, and fat-free or low-fat milk products. Discuss the use of coffee, vitamin C, and cherries with your caregiver or dietitian. These may be helpful in lowering uric acid levels.  Maintain a healthy body weight. SEEK MEDICAL CARE IF:   You develop diarrhea, vomiting, or any side effects from medicines.  You do not feel better in 24 hours, or you are getting worse. SEEK IMMEDIATE MEDICAL CARE IF:   Your joint becomes suddenly more tender, and you have chills or a fever. MAKE SURE YOU:   Understand these instructions.  Will watch your condition.  Will get help right away if you are not doing well or get worse.   This information is not intended to replace advice given to you by your health care provider. Make sure you discuss any questions you have with your health care provider.   Document Released: 08/20/2000 Document Revised: 09/13/2014 Document Reviewed: 04/05/2012 Elsevier Interactive Patient Education 2016  Elsevier Inc.     IF you received an x-ray today, you will receive an invoice from [facility]. Please contact [facility] at [phone] with questions or concerns regarding your invoice.   IF you received labwork today, you will receive an invoice from [facility]. Please contact [facility] at [phone] with questions or concerns regarding your invoice.   Our billing staff will not be able to assist you with questions regarding bills from these companies.  You will be contacted with the lab results as soon as they are available. The fastest way to get your results is to activate your My Chart account. Instructions are located on the last page of this paperwork. If you have not heard from us regarding the results in 2 weeks, please contact this office.      

## 2016-05-08 NOTE — Progress Notes (Signed)
    MRN: [id number] DOB: Dec 21, 1955  Subjective:   Mckenzie [name] is a 60 y.o. female presenting for chief complaint of Other (Gout flair up - right foot x 2 days )  Reports 2 day history of right foot pain of 1st MTP. She has been taking Allopurinol as diagnosed 12/2015 with a high uric acid level >9. At that time, she had gout of her left foot at that time, states that her pain is exactly the same. She denies trauma, numbness or tingling, rash. She tries to eat healthily. She does take HCTZ for her HTN. She states that her BP was normal this morning but feels like it worsened throughout the day as her pain worsened.  Olegario MessierKathy has a current medication list which includes the following prescription(s): allopurinol, amlodipine, hydrochlorothiazide, ibuprofen, and valacyclovir. Also has No Known Allergies.  Olegario MessierKathy  has a past medical history of Hypertension. Also  has no past surgical history on file.  Objective:   Vitals: BP (!) 159/96   Pulse (!) 104   Resp 16   Ht 6' (1.829 m)   Wt (!) 418 lb (189.6 kg)   SpO2 94%   BMI 56.69 kg/m   Physical Exam  Constitutional: She is oriented to person, place, and time. She appears well-developed and well-nourished.  Cardiovascular: Normal rate, regular rhythm and intact distal pulses.  Exam reveals no gallop and no friction rub.   No murmur heard. Pulmonary/Chest: No respiratory distress. She has no wheezes. She has no rales.  Musculoskeletal:       Right foot: There is tenderness (exquisite tenderness over right 1st MTP) and swelling (trace over 1st MTP and dorsal aspect of foot up to phalanges). There is normal range of motion, normal capillary refill, no crepitus, no deformity and no laceration.  Neurological: She is alert and oriented to person, place, and time.  Skin: Skin is warm and dry.   Assessment and Plan :   1. Acute gout of right foot, unspecified cause 2. Essential hypertension 3. Elevated blood pressure reading - Start  indomethacin for suspected gout attack. Counseled patient on not using Allopurinol during gout attacks. RTC in 3 days if no improvement. Monitor BP and report to ED if chest pain, shob, lower leg swelling, belly pain or headache develop. Patient verbalized understanding. Consider switching off HCTZ if gout persists, this may be a source of her gout. Patient verbalized understanding.  Wallis BambergMario Keyondre Hepburn, PA-C [facility] [phone] 05/08/2016 2:33 PM

## 2016-05-13 ENCOUNTER — Encounter: Payer: Self-pay | Admitting: Urgent Care

## 2016-05-19 ENCOUNTER — Encounter: Payer: Self-pay | Admitting: Urgent Care

## 2016-05-19 ENCOUNTER — Ambulatory Visit (INDEPENDENT_AMBULATORY_CARE_PROVIDER_SITE_OTHER): Payer: Self-pay | Admitting: Urgent Care

## 2016-05-19 VITALS — BP 142/92 | HR 99 | Temp 99.1°F | Resp 17 | Ht 72.0 in | Wt >= 6400 oz

## 2016-05-19 DIAGNOSIS — M109 Gout, unspecified: Secondary | ICD-10-CM

## 2016-05-19 DIAGNOSIS — I10 Essential (primary) hypertension: Secondary | ICD-10-CM

## 2016-05-19 DIAGNOSIS — IMO0001 Reserved for inherently not codable concepts without codable children: Secondary | ICD-10-CM

## 2016-05-19 DIAGNOSIS — R030 Elevated blood-pressure reading, without diagnosis of hypertension: Secondary | ICD-10-CM

## 2016-05-19 DIAGNOSIS — M25471 Effusion, right ankle: Secondary | ICD-10-CM

## 2016-05-19 LAB — POCT CBC
GRANULOCYTE PERCENT: 71 % (ref 37–80)
HEMATOCRIT: 40.6 % (ref 37.7–47.9)
HEMOGLOBIN: 13.7 g/dL (ref 12.2–16.2)
Lymph, poc: 1.6 (ref 0.6–3.4)
MCH: 29.3 pg (ref 27–31.2)
MCHC: 33.7 g/dL (ref 31.8–35.4)
MCV: 87.1 fL (ref 80–97)
MID (cbc): 0.5 (ref 0–0.9)
MPV: 7.5 fL (ref 0–99.8)
POC GRANULOCYTE: 5.2 (ref 2–6.9)
POC LYMPH PERCENT: 22.1 %L (ref 10–50)
POC MID %: 6.9 % (ref 0–12)
Platelet Count, POC: 256 10*3/uL (ref 142–424)
RBC: 4.66 M/uL (ref 4.04–5.48)
RDW, POC: 15.8 %
WBC: 7.3 10*3/uL (ref 4.6–10.2)

## 2016-05-19 NOTE — Patient Instructions (Addendum)
Please take 25mg  hydrochlorothiazide (2 pills), take 5mg  (1/2) tablet of amlodipine and keep indomethacin on board.    IF you received an x-ray today, you will receive an invoice from [facility]. Please contact [facility] at [phone] with questions or concerns regarding your invoice.   IF you received labwork today, you will receive an invoice from [facility]. Please contact [facility] at [phone] with questions or concerns regarding your invoice.   Our billing staff will not be able to assist you with questions regarding bills from these companies.  You will be contacted with the lab results as soon as they are available. The fastest way to get your results is to activate your My Chart account. Instructions are located on the last page of this paperwork. If you have not heard from us regarding the results in 2 weeks, please contact this office.

## 2016-05-19 NOTE — Progress Notes (Signed)
    MRN: [id number] DOB: [birth year]  Subjective:   Mckenzie [name] is a 60 y.o. female presenting for follow up on gout.   Patient was last seen on 05/08/2016 and treated for acute gout attack of her right foot with indomethacin. She notes significant improvement in her foot pain and swelling. But she is concerned that she now has swelling in her right ankle, has very mild tenderness as well. Denies trauma, warmth, redness.  Mckenzie [name] has a current medication list which includes the following prescription(s): allopurinol, amlodipine, hydrochlorothiazide, ibuprofen, indomethacin, and valacyclovir. Also has No Known Allergies.  Mckenzie [name]  has a past medical history of Hypertension. Also  has no past surgical history on file.  Objective:   Vitals: BP (!) 142/92 (BP Location: Right Arm, Patient Position: Sitting, Cuff Size: Large)   Pulse 99   Temp 99.1 F (37.3 C) (Oral)   Resp 17   Ht 6' (1.829 m)   Wt (!) 437 lb (198.2 kg)   SpO2 92%   BMI 59.27 kg/m   Physical Exam  Constitutional: She is oriented to person, place, and time. She appears well-developed and well-nourished.  Cardiovascular: Normal rate.   Pulmonary/Chest: Effort normal.  Musculoskeletal:       Right ankle: She exhibits swelling (trace). She exhibits normal range of motion, no ecchymosis, no deformity, no laceration and normal pulse. Tenderness (over heel). No lateral malleolus, no medial malleolus, no AITFL, no CF ligament, no posterior TFL, no head of 5th metatarsal and no proximal fibula tenderness found. Achilles tendon exhibits no pain and no defect.  Neurological: She is alert and oriented to person, place, and time.   Results for orders placed or performed in visit on 05/19/16 (from the past 24 hour(s))  POCT CBC     Status: None   Collection Time: 05/19/16  5:12 PM  Result Value Ref Range   WBC 7.3 4.6 - 10.2 K/uL   Lymph, poc 1.6 0.6 - 3.4   POC LYMPH PERCENT 22.1 10 - 50 %L   MID (cbc) 0.5 0 - 0.9   POC MID %  6.9 0 - 12 %M   POC Granulocyte 5.2 2 - 6.9   Granulocyte percent 71.0 37 - 80 %G   RBC 4.66 4.04 - 5.48 M/uL   Hemoglobin 13.7 12.2 - 16.2 g/dL   HCT, POC 40.940.6 81.137.7 - 47.9 %   MCV 87.1 80 - 97 fL   MCH, POC 29.3 27 - 31.2 pg   MCHC 33.7 31.8 - 35.4 g/dL   RDW, POC 91.415.8 %   Platelet Count, POC 256 142 - 424 K/uL   MPV 7.5 0 - 99.8 fL   Assessment and Plan :   1. Ankle swelling, right 2. Gout of right foot, unspecified cause, unspecified chronicity 3. Essential hypertension 4. Elevated blood pressure reading - Please take 25mg  hydrochlorothiazide (2 pills), take 5mg  (1/2) tablet of amlodipine and keep indomethacin on board.  Wallis BambergMario Nahiara Kretzschmar, PA-C [facility] [phone] 05/19/2016 5:02 PM

## 2016-06-06 ENCOUNTER — Other Ambulatory Visit: Payer: Self-pay | Admitting: Urgent Care

## 2016-06-08 ENCOUNTER — Other Ambulatory Visit: Payer: Self-pay

## 2016-06-08 MED ORDER — HYDROCHLOROTHIAZIDE 25 MG PO TABS: 25.0000 mg | ORAL_TABLET | Freq: Every day | ORAL | 1 refills | Status: DC

## 2016-06-08 NOTE — Telephone Encounter (Signed)
Called pt to clarify message. Mani's Plan on 9/22 OV notes stated: - Please take 25mg  hydrochlorothiazide (2 pills), take 5mg  (1/2) tablet of amlodipine and keep indomethacin on board. Mani treated pt's gout with indomethacin and pt stated it is under control and she has a couple of RFs left of this med. Plan instr's were for pt to RTC in 1 week. Urban GibsonMani, was this for the gout if didn't improve? Pt was not under the understanding that she needed to return again until regular check up (6 mos) was due if Sxs resolved. She has no ins and had been needing to come in Q couple of weeks for gout. Message below is incorrect. Pt was NOT taking 50 mg of HCTZ, only two 12.5 caps as instr'd for total of 25 mg QD. She would like for me to pend Rx for the 25 mg caps so that she will only have to take 1 per day. Urban GibsonMani, will pend, but wanted to OK from you to make sure you don't need to see her back.

## 2016-06-08 NOTE — Telephone Encounter (Signed)
Called and advised pt of Mani's message and instr's. Pt agreed.

## 2016-06-08 NOTE — Telephone Encounter (Signed)
The rtc in 1 week was for her gout symptoms (no improvement). Otherwise, she can stop indomethacin and stay with HCTZ. However, patient needs to be aware that diuretics can cause gout flares. We may need to switch her medications if she has another gout flare. Please let patient know.

## 2016-06-08 NOTE — Telephone Encounter (Signed)
Patient needs her hydrochlorothiazide (MICROZIDE) 12.5 MG capsule refilled, she stated it was denied because she needs an OV but she was just here seeing Mani on 05/19/16 about this medication and he changed her dose from 25 to 50--she is taking double the pills and needs this refilled because of this.  [facility] on [address]

## 2016-08-03 ENCOUNTER — Encounter: Payer: Self-pay | Admitting: Physician Assistant

## 2016-08-03 ENCOUNTER — Ambulatory Visit (INDEPENDENT_AMBULATORY_CARE_PROVIDER_SITE_OTHER): Payer: Self-pay | Admitting: Physician Assistant

## 2016-08-03 VITALS — BP 132/84 | HR 91 | Temp 98.4°F | Resp 17 | Ht 72.0 in | Wt >= 6400 oz

## 2016-08-03 DIAGNOSIS — M109 Gout, unspecified: Secondary | ICD-10-CM

## 2016-08-03 DIAGNOSIS — I10 Essential (primary) hypertension: Secondary | ICD-10-CM

## 2016-08-03 LAB — BASIC METABOLIC PANEL
BUN: 15 mg/dL (ref 7–25)
CALCIUM: 9.2 mg/dL (ref 8.6–10.4)
CO2: 30 mmol/L (ref 20–31)
CREATININE: 0.83 mg/dL (ref 0.50–1.05)
Chloride: 103 mmol/L (ref 98–110)
GLUCOSE: 98 mg/dL (ref 65–99)
Potassium: 4.3 mmol/L (ref 3.5–5.3)
SODIUM: 143 mmol/L (ref 135–146)

## 2016-08-03 LAB — URIC ACID: Uric Acid, Serum: 9.7 mg/dL — ABNORMAL HIGH (ref 2.5–7.0)

## 2016-08-03 LAB — TSH: TSH: 3.44 m[IU]/L

## 2016-08-03 MED ORDER — METHYLPREDNISOLONE SODIUM SUCC 125 MG IJ SOLR
125.0000 mg | Freq: Once | INTRAMUSCULAR | Status: AC
  Administered 2016-08-03: 125 mg via INTRAMUSCULAR

## 2016-08-03 MED ORDER — METHYLPREDNISOLONE SODIUM SUCC 125 MG IJ SOLR: 125.0000 mg | Freq: Once | INTRAMUSCULAR | Status: DC

## 2016-08-03 MED ORDER — COLCHICINE 0.6 MG PO TABS: ORAL_TABLET | ORAL | 3 refills | Status: DC

## 2016-08-03 MED ORDER — HYDROCHLOROTHIAZIDE 12.5 MG PO TABS: 12.5000 mg | ORAL_TABLET | Freq: Every day | ORAL | 5 refills | Status: DC

## 2016-08-03 MED ORDER — AMLODIPINE BESYLATE 10 MG PO TABS: 10.0000 mg | ORAL_TABLET | Freq: Every day | ORAL | 5 refills | Status: DC

## 2016-08-03 NOTE — Progress Notes (Signed)
08/03/2016 2:28 PM   DOB: 09/11/1955 / MRN: [id number]  SUBJECTIVE:  Mckenzie [name] is a 60 y.o. female presenting for foot pain that she says this is gout.  She was last seen by PA Urban GibsonMani who refilled her Indocin and increased her HCTZ and reduced her Norvasc to control for leg swelling. She denies swelling today.  Says she is out of indocin and her foot continues to bother her.  She has not tried colchicine before.  Reports that prednisone usually take her pain away completely.   She has a history of well controlled HTN and would like her medication refilled today.    She has No Known Allergies.   She  has a past medical history of Hypertension.    She  reports that she has never smoked. She has never used smokeless tobacco. She reports that she drinks alcohol. She reports that she does not use drugs. She  reports that she does not currently engage in sexual activity. She reports using the following method of birth control/protection: Post-menopausal. The patient  has no past surgical history on file.  Her family history is not on file.  Review of Systems  Constitutional: Negative for fever.  Respiratory: Negative for cough and wheezing.   Cardiovascular: Negative for chest pain and leg swelling.  Gastrointestinal: Negative for nausea.  Musculoskeletal: Positive for joint pain and myalgias.  Skin: Negative for rash.    The problem list and medications were reviewed and updated by myself where necessary and exist elsewhere in the encounter.   OBJECTIVE:  BP 132/84 (BP Location: Right Arm, Patient Position: Sitting, Cuff Size: Large)   Pulse 91   Temp 98.4 F (36.9 C) (Oral)   Resp 17   Ht 6' (1.829 m)   Wt (!) 422 lb (191.4 kg)   SpO2 95%   BMI 57.23 kg/m   Physical Exam  Constitutional: She is oriented to person, place, and time. She appears well-nourished. No distress.  Eyes: EOM are normal. Pupils are equal, round, and reactive to light.  Cardiovascular: Normal rate  and regular rhythm.   Pulmonary/Chest: Effort normal and breath sounds normal.  Abdominal: She exhibits no distension.  Musculoskeletal: Normal range of motion.  Neurological: She is alert and oriented to person, place, and time. No cranial nerve deficit. Gait normal.  Skin: Skin is dry. She is not diaphoretic.  Psychiatric: She has a normal mood and affect.  Vitals reviewed.   No results found for this or any previous visit (from the past 72 hour(s)).  No results found.  Lab Results  Component Value Date   LABURIC 6.9 05/08/2016   Lab Results  Component Value Date   CREATININE 0.67 01/08/2016       ASSESSMENT AND PLAN  Olegario MessierKathy was seen today for medication refill.  Diagnoses and all orders for this visit:  Gout of left foot, unspecified cause, unspecified chronicity Comments: Decreasing her HCTZ back to 12.5 as diuretics may be making her gout more difficult to control.  Pred today along with colchicine, stopping indocin.  Orders: -     methylPREDNISolone sodium succinate (SOLU-MEDROL) 125 mg/2 mL injection 125 mg; Inject 2 mLs (125 mg total) into the vein once. -     colchicine 0.6 MG tablet; Take 2 at the first sign of gout flare followed by 1an hour later.  Repeat on day two.  Don't take on day 3.  Start over on day 4 if needed. -     Cancel:  Uric Acid -     Uric Acid  Essential hypertension -     amLODipine (NORVASC) 10 MG tablet; Take 1 tablet (10 mg total) by mouth daily. -     hydrochlorothiazide (HYDRODIURIL) 12.5 MG tablet; Take 1 tablet (12.5 mg total) by mouth daily. -     Cancel: Basic metabolic panel -     Cancel: TSH -     Basic Metabolic Panel -     TSH    The patient is advised to call or return to clinic if she does not see an improvement in symptoms, or to seek the care of the closest emergency department if she worsens with the above plan.   Deliah BostonMichael Clark, MHS, PA-C [facility] 08/03/2016 2:28 PM

## 2016-08-03 NOTE — Patient Instructions (Signed)
     IF you received an x-ray today, you will receive an invoice from [facility]. Please contact  Radiology at [phone] with questions or concerns regarding your invoice.   IF you received labwork today, you will receive an invoice from [facility]. Please contact [facility] at [phone] with questions or concerns regarding your invoice.   Our billing staff will not be able to assist you with questions regarding bills from these companies.  You will be contacted with the lab results as soon as they are available. The fastest way to get your results is to activate your My Chart account. Instructions are located on the last page of this paperwork. If you have not heard from us regarding the results in 2 weeks, please contact this office.      

## 2016-08-03 NOTE — Addendum Note (Signed)
Addended by: Ofilia NeasLARK, MICHAEL L on: 08/03/2016 03:05 PM   Modules accepted: Orders

## 2016-09-16 ENCOUNTER — Ambulatory Visit (INDEPENDENT_AMBULATORY_CARE_PROVIDER_SITE_OTHER): Payer: Self-pay | Admitting: Family Medicine

## 2016-09-16 DIAGNOSIS — M109 Gout, unspecified: Secondary | ICD-10-CM

## 2016-09-16 DIAGNOSIS — I10 Essential (primary) hypertension: Secondary | ICD-10-CM

## 2016-09-16 MED ORDER — INDOMETHACIN 50 MG PO CAPS: 50.0000 mg | ORAL_CAPSULE | Freq: Three times a day (TID) | ORAL | 5 refills | Status: DC

## 2016-09-16 MED ORDER — COLCHICINE 0.6 MG PO TABS: ORAL_TABLET | ORAL | 5 refills | Status: DC

## 2016-09-16 MED ORDER — AMLODIPINE BESYLATE 10 MG PO TABS: 10.0000 mg | ORAL_TABLET | Freq: Every day | ORAL | 1 refills | Status: DC

## 2016-09-16 MED ORDER — HYDROCHLOROTHIAZIDE 12.5 MG PO TABS: 12.5000 mg | ORAL_TABLET | Freq: Every day | ORAL | 1 refills | Status: DC

## 2016-09-16 MED ORDER — TRAMADOL HCL 50 MG PO TABS: 50.0000 mg | ORAL_TABLET | Freq: Three times a day (TID) | ORAL | 1 refills | Status: DC | PRN

## 2016-09-16 NOTE — Patient Instructions (Addendum)
START ALLOPURINOL 100MG  ONE TABLET DAILY.   Gout Gout is painful swelling that can occur in some of your joints. Gout is a type of arthritis. This condition is caused by having too much uric acid in your body. Uric acid is a chemical that forms when your body breaks down substances called purines. Purines are important for building body proteins. When your body has too much uric acid, sharp crystals can form and build up inside your joints. This causes pain and swelling. Gout attacks can happen quickly and be very painful (acute gout). Over time, the attacks can affect more joints and become more frequent (chronic gout). Gout can also cause uric acid to build up under your skin and inside your kidneys. What are the causes? This condition is caused by too much uric acid in your blood. This can occur because:  Your kidneys do not remove enough uric acid from your blood. This is the most common cause.  Your body makes too much uric acid. This can occur with some cancers and cancer treatments. It can also occur if your body is breaking down too many red blood cells (hemolytic anemia).  You eat too many foods that are high in purines. These foods include organ meats and some seafood. Alcohol, especially beer, is also high in purines. A gout attack may be triggered by trauma or stress. What increases the risk? This condition is more likely to develop in people who:  Have a family history of gout.  Are female and middle-aged.  Are female and have gone through menopause.  Are obese.  Frequently drink alcohol, especially beer.  Are dehydrated.  Lose weight too quickly.  Have an organ transplant.  Have lead poisoning.  Take certain medicines, including aspirin, cyclosporine, diuretics, levodopa, and niacin.  Have kidney disease or psoriasis. What are the signs or symptoms? An attack of acute gout happens quickly. It usually occurs in just one joint. The most common place is the big toe.  Attacks often start at night. Other joints that may be affected include joints of the feet, ankle, knee, fingers, wrist, or elbow. Symptoms may include:  Severe pain.  Warmth.  Swelling.  Stiffness.  Tenderness. The affected joint may be very painful to touch.  Shiny, red, or purple skin.  Chills and fever. Chronic gout may cause symptoms more frequently. More joints may be involved. You may also have white or yellow lumps (tophi) on your hands or feet or in other areas near your joints. How is this diagnosed? This condition is diagnosed based on your symptoms, medical history, and physical exam. You may have tests, such as:  Blood tests to measure uric acid levels.  Removal of joint fluid with a needle (aspiration) to look for uric acid crystals.  X-rays to look for joint damage. How is this treated? Treatment for this condition has two phases: treating an acute attack and preventing future attacks. Acute gout treatment may include medicines to reduce pain and swelling, including:  NSAIDs.  Steroids. These are strong anti-inflammatory medicines that can be taken by mouth (orally) or injected into a joint.  Colchicine. This medicine relieves pain and swelling when it is taken soon after an attack. It can be given orally or through an IV tube. Preventive treatment may include:  Daily use of smaller doses of NSAIDs or colchicine.  Use of a medicine that reduces uric acid levels in your blood.  Changes to your diet. You may need to see a specialist about  healthy eating (dietitian). Follow these instructions at home: During a Gout Attack  If directed, apply ice to the affected area:  Put ice in a plastic bag.  Place a towel between your skin and the bag.  Leave the ice on for 20 minutes, 2-3 times a day.  Rest the joint as much as possible. If the affected joint is in your leg, you may be given crutches to use.  Raise (elevate) the affected joint above the level of  your heart as often as possible.  Drink enough fluids to keep your urine clear or pale yellow.  Take over-the-counter and prescription medicines only as told by your health care provider.  Do not drive or operate heavy machinery while taking prescription pain medicine.  Follow instructions from your health care provider about eating or drinking restrictions.  Return to your normal activities as told by your health care provider. Ask your health care provider what activities are safe for you. Avoiding Future Gout Attacks  Follow a low-purine diet as told by your dietitian or health care provider. Avoid foods and drinks that are high in purines, including liver, kidney, anchovies, asparagus, herring, mushrooms, mussels, and beer.  Limit alcohol intake to no more than 1 drink a day for nonpregnant women and 2 drinks a day for men. One drink equals 12 oz of beer, 5 oz of wine, or 1 oz of hard liquor.  Maintain a healthy weight or lose weight if you are overweight. If you want to lose weight, talk with your health care provider. It is important that you do not lose weight too quickly.  Start or maintain an exercise program as told by your health care provider.  Drink enough fluids to keep your urine clear or pale yellow.  Take over-the-counter and prescription medicines only as told by your health care provider.  Keep all follow-up visits as told by your health care provider. This is important. Contact a health care provider if:  You have another gout attack.  You continue to have symptoms of a gout attack after10 days of treatment.  You have side effects from your medicines.  You have chills or a fever.  You have burning pain when you urinate.  You have pain in your lower back or belly. Get help right away if:  You have severe or uncontrolled pain.  You cannot urinate. This information is not intended to replace advice given to you by your health care provider. Make sure you  discuss any questions you have with your health care provider. Document Released: 08/20/2000 Document Revised: 01/29/2016 Document Reviewed: 06/05/2015 Elsevier Interactive Patient Education  2017 [employer]    IF you received an x-ray today, you will receive an invoice from [facility]. Please contact [facility] at [phone] with questions or concerns regarding your invoice.   IF you received labwork today, you will receive an invoice from [facility]. Please contact [facility] at [phone] with questions or concerns regarding your invoice.   Our billing staff will not be able to assist you with questions regarding bills from these companies.  You will be contacted with the lab results as soon as they are available. The fastest way to get your results is to activate your My Chart account. Instructions are located on the last page of this paperwork. If you have not heard from us regarding the results in 2 weeks, please contact this office.

## 2016-09-16 NOTE — Progress Notes (Signed)
Subjective:    Patient ID: Mckenzie HawkingKathy A Coltrain, female    DOB: Mar 16, 1956, 61 y.o.   MRN: [id number]  09/16/2016  Follow-up (medication f/u)   HPI This 61 y.o. female presents for follow-up for chronic medical conditions. Need HCTZ and Amlodipine in 90 days.  Newest concern is gout.  Colchicine with Indomethacin.  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  Travels a lot.  Tramadol don't work well for pain.   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  [place]osed [place]out in [place] last year.  Just received injection in knees recently.  Knees are really hurting.  S/p xrays.Getting glout Flares frequently.Having one gout flare every month.  Decreased HCTZ due to gout flare.  Has Allopurinol but has not started it.  Has a full bottle but has not started it yet.  Hurting in L first MTP joint; no fever in joint currently.    Immunization History  Administered Date(s) Administered  . Tdap 07/09/2015   BP Readings from Last 3 Encounters:  09/16/16 (!) 154/96  08/03/16 132/84  05/19/16 (!) 142/92   Wt Readings from Last 3 Encounters:  09/16/16 (!) 422 lb 3.2 oz (191.5 kg)  08/03/16 (!) 422 lb (191.4 kg)  05/19/16 (!) 437 lb (198.2 kg)    Review of Systems  Constitutional: Negative for chills, diaphoresis, fatigue and fever.  Eyes: Negative for visual disturbance.  Respiratory: Negative for cough and shortness of breath.   Cardiovascular: Negative for chest pain, palpitations and leg swelling.  Gastrointestinal: Negative for abdominal pain, constipation, diarrhea, nausea and vomiting.  Endocrine: Negative for cold intolerance, heat intolerance, polydipsia, polyphagia and polyuria.  Musculoskeletal: Positive for arthralgias and gait problem.  Neurological: Negative for dizziness, tremors, seizures, syncope, facial asymmetry, speech difficulty, weakness, light-headedness, numbness and headaches.    Past Medical History:  Diagnosis Date  . Hypertension    History reviewed. No  pertinent surgical history. No Known Allergies  Social History   Social History  . Marital status: Single    Spouse name: N/A  . Number of children: N/A  . Years of education: N/A   Occupational History  . Not on file.   Social History Main Topics  . Smoking status: Never Smoker  . Smokeless tobacco: Never Used  . Alcohol use Yes  . Drug use: No  . Sexual activity: Not Currently    Birth control/ protection: Post-menopausal   Other Topics Concern  . Not on file   Social History Narrative  . No narrative on file   History reviewed. No pertinent family history.     Objective:    BP (!) 154/96 (BP Location: Right Arm, Patient Position: Sitting, Cuff Size: Large)   Pulse 84   Temp 97.7 F (36.5 C) (Oral)   Ht 6' (1.829 m)   Wt (!) 422 lb 3.2 oz (191.5 kg)   SpO2 96%   BMI 57.26 kg/m  Physical Exam  Constitutional: She is oriented to person, place, and time. She appears well-developed and well-nourished. No distress.  HENT:  Head: Normocephalic and atraumatic.  Right Ear: External ear normal.  Left Ear: External ear normal.  Nose: Nose normal.  Mouth/Throat: Oropharynx is clear and moist.  Eyes: Conjunctivae and EOM are normal. Pupils are equal, round, and reactive to light.  Neck: Normal range of motion. Neck supple. Carotid bruit is not present. No thyromegaly present.  Cardiovascular: Normal rate, regular rhythm, normal heart sounds and intact distal pulses.  Exam reveals no  gallop and no friction rub.   No murmur heard. Pulmonary/Chest: Effort normal and breath sounds normal. She has no wheezes. She has no rales.  Abdominal: Soft. Bowel sounds are normal. She exhibits no distension and no mass. There is no tenderness. There is no rebound and no guarding.  Musculoskeletal:       Left ankle: Normal.       Left foot: There is decreased range of motion and tenderness. There is no bony tenderness and no swelling.  Lymphadenopathy:    She has no cervical  adenopathy.  Neurological: She is alert and oriented to person, place, and time. No cranial nerve deficit.  Skin: Skin is warm and dry. No rash noted. She is not diaphoretic. No erythema. No pallor.  Psychiatric: She has a normal mood and affect. Her behavior is normal.   Results for orders placed or performed in visit on 08/03/16  Basic Metabolic Panel  Result Value Ref Range   Sodium 143 135 - 146 mmol/L   Potassium 4.3 3.5 - 5.3 mmol/L   Chloride 103 98 - 110 mmol/L   CO2 30 20 - 31 mmol/L   Glucose, Bld 98 65 - 99 mg/dL   BUN 15 7 - 25 mg/dL   Creat 6.57 8.46 - 9.62 mg/dL   Calcium 9.2 8.6 - 95.2 mg/dL  TSH  Result Value Ref Range   TSH 3.44 mIU/L  Uric Acid  Result Value Ref Range   Uric Acid, Serum 9.7 (H) 2.5 - 7.0 mg/dL       Assessment & Plan:   1. Essential hypertension   2. Gout of left foot, unspecified cause, unspecified chronicity   3. Acute gout of right foot, unspecified cause    -gout is uncontrolled; advised to start Allopurinol now. -refills provided. -rx for Tramadol provided for pain control with gout.   Orders Placed This Encounter  Procedures  . Care order/instruction:    Please recheck BP.  . Care order/instruction:    AVS printed - let patient go!   Meds ordered this encounter  Medications  . amLODipine (NORVASC) 10 MG tablet    Sig: Take 1 tablet (10 mg total) by mouth daily.    Dispense:  90 tablet    Refill:  1  . hydrochlorothiazide (HYDRODIURIL) 12.5 MG tablet    Sig: Take 1 tablet (12.5 mg total) by mouth daily.    Dispense:  90 tablet    Refill:  1  . colchicine 0.6 MG tablet    Sig: Take 2 at the first sign of gout flare followed by 1an hour later.  Repeat on day two.  Don't take on day 3.  Start over on day 4 if needed.    Dispense:  12 tablet    Refill:  5  . indomethacin (INDOCIN) 50 MG capsule    Sig: Take 1 capsule (50 mg total) by mouth 3 (three) times daily with meals.    Dispense:  30 capsule    Refill:  5  .  traMADol (ULTRAM) 50 MG tablet    Sig: Take 1 tablet (50 mg total) by mouth every 8 (eight) hours as needed.    Dispense:  30 tablet    Refill:  1    Return in about 6 months (around 03/16/2017).   Staley Budzinski Paulita Fujita, M.D. [facility] [address] [place], [place]  [address] [phone] phone [phone] fax

## 2016-10-03 ENCOUNTER — Encounter: Payer: Self-pay | Admitting: Family Medicine

## 2016-11-15 ENCOUNTER — Ambulatory Visit (INDEPENDENT_AMBULATORY_CARE_PROVIDER_SITE_OTHER): Payer: Self-pay | Admitting: Physician Assistant

## 2016-11-15 ENCOUNTER — Encounter: Payer: Self-pay | Admitting: Physician Assistant

## 2016-11-15 VITALS — BP 148/81 | HR 91 | Temp 98.0°F | Resp 16 | Ht 72.0 in | Wt >= 6400 oz

## 2016-11-15 DIAGNOSIS — R3915 Urgency of urination: Secondary | ICD-10-CM

## 2016-11-15 DIAGNOSIS — M545 Low back pain, unspecified: Secondary | ICD-10-CM

## 2016-11-15 DIAGNOSIS — R319 Hematuria, unspecified: Secondary | ICD-10-CM

## 2016-11-15 DIAGNOSIS — N390 Urinary tract infection, site not specified: Secondary | ICD-10-CM

## 2016-11-15 DIAGNOSIS — D72829 Elevated white blood cell count, unspecified: Secondary | ICD-10-CM

## 2016-11-15 DIAGNOSIS — R6883 Chills (without fever): Secondary | ICD-10-CM

## 2016-11-15 LAB — POCT CBC
Granulocyte percent: 74.4 % (ref 37–80)
HCT, POC: 38.9 % (ref 37.7–47.9)
Hemoglobin: 13.5 g/dL (ref 12.2–16.2)
Lymph, poc: 2.2 (ref 0.6–3.4)
MCH, POC: 30.7 pg (ref 27–31.2)
MCHC: 34.9 g/dL (ref 31.8–35.4)
MCV: 88.1 fL (ref 80–97)
MID (cbc): 0.8 (ref 0–0.9)
MPV: 7.8 fL (ref 0–99.8)
POC Granulocyte: 8.7 — AB (ref 2–6.9)
POC LYMPH PERCENT: 19 %L (ref 10–50)
POC MID %: 6.6 %M (ref 0–12)
Platelet Count, POC: 272 10*3/uL (ref 142–424)
RBC: 4.41 M/uL (ref 4.04–5.48)
RDW, POC: 15.2 %
WBC: 11.7 10*3/uL — AB (ref 4.6–10.2)

## 2016-11-15 LAB — POCT URINALYSIS DIP (MANUAL ENTRY)
Bilirubin, UA: NEGATIVE
Glucose, UA: NEGATIVE
Ketones, POC UA: NEGATIVE
Nitrite, UA: POSITIVE — AB
Protein Ur, POC: 100 — AB
Spec Grav, UA: 1.02
Urobilinogen, UA: 0.2
pH, UA: 5

## 2016-11-15 LAB — POC MICROSCOPIC URINALYSIS (UMFC): Mucus: ABSENT

## 2016-11-15 MED ORDER — CEFTRIAXONE SODIUM 250 MG IJ SOLR
1.0000 g | Freq: Once | INTRAMUSCULAR | Status: DC
Start: 2016-11-15 — End: 2016-11-15

## 2016-11-15 MED ORDER — CEFTRIAXONE SODIUM 1 G IJ SOLR
1.0000 g | Freq: Once | INTRAMUSCULAR | Status: AC
  Administered 2016-11-15: 1 g via INTRAMUSCULAR

## 2016-11-15 MED ORDER — NITROFURANTOIN MONOHYD MACRO 100 MG PO CAPS: 100.0000 mg | ORAL_CAPSULE | Freq: Two times a day (BID) | ORAL | 0 refills | Status: DC

## 2016-11-15 NOTE — Progress Notes (Signed)
Mckenzie [name]  MRN: [id number] DOB: Sep 10, 1955  PCP: Mckenzie Simmer, MD  Subjective:  Pt is a 61 year old female PMH HTN who presents to clinic for low back pain and chills x 2 days. She endorses increased urinary urgency and frequency.   Denies burning with urination, blood in urine, fever, flank pain, vaginal discharge, vaginal pain, nausea, vomiting. She admits to holding her urine while at work. She has not taken anything for this. Her last UTI was >1 year ago. H/o kidney infection several years ago.   Review of Systems  Constitutional: Positive for chills. Negative for fatigue and fever.  Respiratory: Negative for cough, shortness of breath and wheezing.   Cardiovascular: Negative for chest pain and palpitations.  Gastrointestinal: Negative for abdominal pain, diarrhea, nausea and vomiting.  Genitourinary: Positive for frequency and urgency. Negative for decreased urine volume, difficulty urinating, dysuria, enuresis, flank pain and hematuria.  Musculoskeletal: Positive for back pain.  Neurological: Negative for dizziness, weakness, light-headedness and headaches.    Patient Active Problem List   Diagnosis Date Noted  . Sciatica of right side 12/18/2014  . Morbid obesity (HCC) 06/18/2013  . Unspecified essential hypertension 03/22/2013    Current Outpatient Prescriptions on File Prior to Visit  Medication Sig Dispense Refill  . allopurinol (ZYLOPRIM) 100 MG tablet Take 1 tablet (100 mg total) by mouth daily. 90 tablet 3  . amLODipine (NORVASC) 10 MG tablet Take 1 tablet (10 mg total) by mouth daily. 90 tablet 1  . colchicine 0.6 MG tablet Take 2 at the first sign of gout flare followed by 1an hour later.  Repeat on day two.  Don't take on day 3.  Start over on day 4 if needed. 12 tablet 5  . hydrochlorothiazide (HYDRODIURIL) 12.5 MG tablet Take 1 tablet (12.5 mg total) by mouth daily. 90 tablet 1  . ibuprofen (ADVIL,MOTRIN) 800 MG tablet Take 800 mg by mouth every 8 (eight)  hours as needed.    . indomethacin (INDOCIN) 50 MG capsule Take 1 capsule (50 mg total) by mouth 3 (three) times daily with meals. 30 capsule 5  . traMADol (ULTRAM) 50 MG tablet Take 1 tablet (50 mg total) by mouth every 8 (eight) hours as needed. 30 tablet 1  . valACYclovir (VALTREX) 1000 MG tablet 2 po at outbreak onset then repeat in 12h for each outbreak 4 tablet 12   No current facility-administered medications on file prior to visit.     No Known Allergies   Objective:  BP (!) 148/81   Pulse 91   Temp 98 F (36.7 C) (Oral)   Resp 16   Ht 6' (1.829 m)   Wt (!) 422 lb (191.4 kg)   SpO2 96%   BMI 57.23 kg/m   Physical Exam  Constitutional: She is oriented to person, place, and time and well-developed, well-nourished, and in no distress. She does not have a sickly appearance. No distress.  Cardiovascular: Normal rate, regular rhythm and normal heart sounds.   Abdominal: There is no tenderness. There is no CVA tenderness.  Neurological: She is alert and oriented to person, place, and time. GCS score is 15.  Skin: Skin is warm and dry.  Psychiatric: Mood, memory, affect and judgment normal.  Vitals reviewed.  Results for orders placed or performed in visit on 11/15/16  POCT urinalysis dipstick  Result Value Ref Range   Color, UA yellow yellow   Clarity, UA clear clear   Glucose, UA negative negative   Bilirubin, UA  negative negative   Ketones, POC UA negative negative   Spec Grav, UA 1.020 1.003, 1.005, 1.010, 1.015, 1.020, 1.035   Blood, UA moderate (A) negative   pH, UA 5.0 5.0, 5.5, 6.0, 6.5, 7.0, 7.5, 8.0   Protein Ur, POC =100 (A) negative   Urobilinogen, UA 0.2 0.2, 1.0   Nitrite, UA Positive (A) Negative   Leukocytes, UA large (3+) (A) Negative  POCT Microscopic Urinalysis ([facility])  Result Value Ref Range   WBC,UR,HPF,POC Too numerous to count  (A) None WBC/hpf   RBC,UR,HPF,POC Moderate (A) None RBC/hpf   Bacteria Many (A) None, Too numerous to count   Mucus  Absent Absent   Epithelial Cells, UR Per Microscopy Moderate (A) None, Too numerous to count cells/hpf  POCT CBC  Result Value Ref Range   WBC 11.7 (A) 4.6 - 10.2 K/uL   Lymph, poc 2.2 0.6 - 3.4   POC LYMPH PERCENT 19.0 10 - 50 %L   MID (cbc) 0.8 0 - 0.9   POC MID % 6.6 0 - 12 %M   POC Granulocyte 8.7 (A) 2 - 6.9   Granulocyte percent 74.4 37 - 80 %G   RBC 4.41 4.04 - 5.48 M/uL   Hemoglobin 13.5 12.2 - 16.2 g/dL   HCT, POC 40.938.9 81.137.7 - 47.9 %   MCV 88.1 80 - 97 fL   MCH, POC 30.7 27 - 31.2 pg   MCHC 34.9 31.8 - 35.4 g/dL   RDW, POC 91.415.2 %   Platelet Count, POC 272 142 - 424 K/uL   MPV 7.8 0 - 99.8 fL    Assessment and Plan :  1. Urinary tract infection with hematuria, site unspecified 2. Urinary urgency  3. Chills 4. Acute bilateral low back pain without sciatica - Ciprofloxacin 500mg  bid x 14 days - Ceftriaxone (ROCEPHIN) injection IM 1g, once - Urine culture - POCT urinalysis dipstick - POCT Microscopic Urinalysis ([facility]) - POCT CBC - Pt has h/o pyelonephritis. Considering c/c chills and low back pain along with her leukocytosis with left shift, will treat for early pyelonephritis. Vital signs nml. Verbal order to pharmacy for Ciprofloxacin 500mg  bid x 14 days. Pt agrees with plan. Supportive care: Push fluids. RTC precautions discussed.   - UTI prevention discussed with patient: do not hold urine, stay well hydrated, urinate after sex  [name], PA-C  [facility] 11/15/2016 11:00 AM

## 2016-11-15 NOTE — Patient Instructions (Addendum)
Please come back in 24-48 hours for repeat urine and CBC.    Thank you for coming in today. I hope you feel we met your needs.  Feel free to call [facility] if you have any questions or further requests.  Please consider signing up for MyChart if you do not already have it, as this is a great way to communicate with me.  Best,  [employer], PA-C

## 2016-11-15 NOTE — Addendum Note (Signed)
Addended by: Sebastian AcheMCVEY, Jennene Downie WHITNEY on: 11/15/2016 12:39 PM   Modules accepted: Level of Service

## 2016-11-15 NOTE — Addendum Note (Signed)
Addended by: Sebastian AcheMCVEY, Meliah Appleman WHITNEY on: 11/15/2016 12:43 PM   Modules accepted: Orders

## 2016-11-18 LAB — URINE CULTURE

## 2016-11-23 ENCOUNTER — Encounter: Payer: Self-pay | Admitting: Physician Assistant

## 2017-01-17 ENCOUNTER — Other Ambulatory Visit: Payer: Self-pay | Admitting: Obstetrics and Gynecology

## 2017-01-17 DIAGNOSIS — N951 Menopausal and female climacteric states: Secondary | ICD-10-CM

## 2017-01-17 DIAGNOSIS — Z1231 Encounter for screening mammogram for malignant neoplasm of breast: Secondary | ICD-10-CM

## 2017-02-02 ENCOUNTER — Other Ambulatory Visit: Payer: Self-pay | Admitting: Emergency Medicine

## 2017-02-02 NOTE — Telephone Encounter (Signed)
Please review. Deliah BostonMichael Minnie Shi, MS, PA-C 9:40 AM, 02/02/2017

## 2017-02-04 ENCOUNTER — Telehealth: Payer: Self-pay | Admitting: Family Medicine

## 2017-02-04 NOTE — Telephone Encounter (Signed)
[facility] is calling to request a refill on allopurinol 100mg    Best number for pharmacy is [phone]

## 2017-02-04 NOTE — Telephone Encounter (Signed)
09/16/16 last refill

## 2017-02-07 NOTE — Telephone Encounter (Signed)
Pt called in regards to referral question and also following up on refill. Please advise.

## 2017-02-10 MED ORDER — ALLOPURINOL 100 MG PO TABS: 100.0000 mg | ORAL_TABLET | Freq: Every day | ORAL | 0 refills | Status: DC

## 2017-02-10 NOTE — Telephone Encounter (Signed)
Call --- I approved a two month refill of Allopurinol. Pt is overdue for six month follow-up of high blood pressure and gout.  Please schedule office visit with me in upcoming 6-8- weeks.

## 2017-02-22 ENCOUNTER — Ambulatory Visit
Admission: RE | Admit: 2017-02-22 | Discharge: 2017-02-22 | Disposition: A | Discharge status: Home / Self Care | Payer: No Typology Code available for payment source | Source: Ambulatory Visit | Attending: Obstetrics and Gynecology | Admitting: Obstetrics and Gynecology

## 2017-02-22 DIAGNOSIS — N951 Menopausal and female climacteric states: Secondary | ICD-10-CM

## 2017-02-22 DIAGNOSIS — Z1231 Encounter for screening mammogram for malignant neoplasm of breast: Secondary | ICD-10-CM

## 2017-06-21 ENCOUNTER — Telehealth: Payer: Self-pay

## 2017-06-21 NOTE — Telephone Encounter (Signed)
Called patient to discuss overdue health maintenance.  She said she would schedule a cpe with Dr. Katrinka Blazing.  I transferred call to [facility] to schedule appointment.

## 2017-07-08 ENCOUNTER — Encounter: Payer: Self-pay | Admitting: Family Medicine

## 2017-07-08 ENCOUNTER — Ambulatory Visit (INDEPENDENT_AMBULATORY_CARE_PROVIDER_SITE_OTHER): Payer: Self-pay | Admitting: Family Medicine

## 2017-07-08 VITALS — BP 136/87 | HR 105 | Temp 98.0°F | Resp 16 | Ht 72.84 in | Wt >= 6400 oz

## 2017-07-08 DIAGNOSIS — Z0000 Encounter for general adult medical examination without abnormal findings: Secondary | ICD-10-CM

## 2017-07-08 DIAGNOSIS — B001 Herpesviral vesicular dermatitis: Secondary | ICD-10-CM

## 2017-07-08 DIAGNOSIS — Z131 Encounter for screening for diabetes mellitus: Secondary | ICD-10-CM

## 2017-07-08 DIAGNOSIS — M5431 Sciatica, right side: Secondary | ICD-10-CM

## 2017-07-08 DIAGNOSIS — I10 Essential (primary) hypertension: Secondary | ICD-10-CM

## 2017-07-08 DIAGNOSIS — Z1211 Encounter for screening for malignant neoplasm of colon: Secondary | ICD-10-CM

## 2017-07-08 DIAGNOSIS — Z1159 Encounter for screening for other viral diseases: Secondary | ICD-10-CM

## 2017-07-08 DIAGNOSIS — Z114 Encounter for screening for human immunodeficiency virus [HIV]: Secondary | ICD-10-CM

## 2017-07-08 DIAGNOSIS — M109 Gout, unspecified: Secondary | ICD-10-CM

## 2017-07-08 LAB — POCT URINALYSIS DIP (MANUAL ENTRY)
BILIRUBIN UA: NEGATIVE
Glucose, UA: NEGATIVE mg/dL
Ketones, POC UA: NEGATIVE mg/dL
NITRITE UA: NEGATIVE
PH UA: 5.5 (ref 5.0–8.0)
PROTEIN UA: NEGATIVE mg/dL
Spec Grav, UA: 1.02 (ref 1.010–1.025)
UROBILINOGEN UA: 0.2 U/dL

## 2017-07-08 MED ORDER — TRAMADOL HCL 50 MG PO TABS: 50.0000 mg | ORAL_TABLET | Freq: Three times a day (TID) | ORAL | 1 refills | Status: DC | PRN

## 2017-07-08 MED ORDER — IBUPROFEN 800 MG PO TABS: 800.0000 mg | ORAL_TABLET | Freq: Three times a day (TID) | ORAL | 0 refills | Status: DC | PRN

## 2017-07-08 MED ORDER — ALLOPURINOL 100 MG PO TABS: 100.0000 mg | ORAL_TABLET | Freq: Every day | ORAL | 3 refills | Status: DC

## 2017-07-08 MED ORDER — HYDROCHLOROTHIAZIDE 12.5 MG PO TABS: 12.5000 mg | ORAL_TABLET | Freq: Every day | ORAL | 3 refills | Status: DC

## 2017-07-08 MED ORDER — COLCHICINE 0.6 MG PO TABS: ORAL_TABLET | ORAL | 5 refills | Status: DC

## 2017-07-08 MED ORDER — VALACYCLOVIR HCL 1 G PO TABS: ORAL_TABLET | ORAL | 12 refills | Status: DC

## 2017-07-08 MED ORDER — AMLODIPINE BESYLATE 10 MG PO TABS: 10.0000 mg | ORAL_TABLET | Freq: Every day | ORAL | 3 refills | Status: DC

## 2017-07-08 NOTE — Progress Notes (Signed)
Subjective:    Patient ID: Mckenzie [name], female    DOB: 1956/02/03, 61 y.o.   MRN: [id number]  07/08/2017  Annual Exam    HPI This 61 y.o. female presents for Complete Physical Examination.  Last physical: Pap smear:  02-21-2017 Arlyce Harman [facility] Mammogram:  02-22-2017 Colonoscopy:  At age 56; Dr. Loreta [address] Bone density:  02-22-2017 Eye exam:  Glasses; yearly; Larena Sox on [facility] Dental exam:  dentures   Gout: one episode per six months.  HTN: Patient reports good compliance with medication, good tolerance to medication, and good symptom control.     Visual Acuity Screening   Right eye Left eye Both eyes  Without correction:     With correction: 20/20 20/20 20/20     BP Readings from Last 3 Encounters:  07/08/17 136/87  11/15/16 (!) 148/81  09/16/16 134/74   Wt Readings from Last 3 Encounters:  07/08/17 (!) 418 lb (189.6 kg)  11/15/16 (!) 422 lb (191.4 kg)  09/16/16 (!) 422 lb 3.2 oz (191.5 kg)   Immunization History  Administered Date(s) Administered  . Tdap 07/09/2015    Review of Systems  Constitutional: Negative for activity change, appetite change, chills, diaphoresis, fatigue, fever and unexpected weight change.  HENT: Negative for congestion, dental problem, drooling, ear discharge, ear pain, facial swelling, hearing loss, mouth sores, nosebleeds, postnasal drip, rhinorrhea, sinus pressure, sneezing, sore throat, tinnitus, trouble swallowing and voice change.   Eyes: Positive for itching. Negative for photophobia, pain, discharge, redness and visual disturbance.  Respiratory: Negative for apnea, cough, choking, chest tightness, shortness of breath, wheezing and stridor.   Cardiovascular: Negative for chest pain, palpitations and leg swelling.  Gastrointestinal: Negative for abdominal distention, abdominal pain, anal bleeding, blood in stool, constipation, diarrhea, nausea, rectal pain and vomiting.  Endocrine: Negative for cold intolerance,  heat intolerance, polydipsia, polyphagia and polyuria.  Genitourinary: Negative for decreased urine volume, difficulty urinating, dyspareunia, dysuria, enuresis, flank pain, frequency, genital sores, hematuria, menstrual problem, pelvic pain, urgency, vaginal bleeding, vaginal discharge and vaginal pain.       Nocturia x 0.  No urinary leakage.  Musculoskeletal: Negative for arthralgias, back pain, gait problem, joint swelling, myalgias, neck pain and neck stiffness.  Skin: Negative for color change, pallor, rash and wound.  Allergic/Immunologic: Negative for environmental allergies, food allergies and immunocompromised state.  Neurological: Negative for dizziness, tremors, seizures, syncope, facial asymmetry, speech difficulty, weakness, light-headedness, numbness and headaches.  Hematological: Negative for adenopathy. Does not bruise/bleed easily.  Psychiatric/Behavioral: Negative for agitation, behavioral problems, confusion, decreased concentration, dysphoric mood, hallucinations, self-injury, sleep disturbance and suicidal ideas. The patient is not nervous/anxious and is not hyperactive.        Bedtime 2:00am; wakes up at 7:00am.      Past Medical History:  Diagnosis Date  . Gout   . Hypertension    History reviewed. No pertinent surgical history. No Known Allergies Current Outpatient Prescriptions on File Prior to Visit  Medication Sig Dispense Refill  . ibuprofen (ADVIL,MOTRIN) 800 MG tablet Take 800 mg by mouth every 8 (eight) hours as needed.     No current facility-administered medications on file prior to visit.    Social History   Social History  . Marital status: Single    Spouse name: N/A  . Number of children: 0  . Years of education: N/A   Occupational History  . [employer]   Social History Main Topics  . Smoking status: Never Smoker  . Smokeless  tobacco: Never Used  . Alcohol use Yes  . Drug use: No  . Sexual activity: Not Currently      Birth control/ protection: Post-menopausal   Other Topics Concern  . Not on file   Social History Narrative   Marital status:  Single; dating loves to date!      Children: none       Lives: alone in huge farm house      Employment: [name]; full time; missionary work.  Speaking engagements.      Tobacco: none      Alcohol: socially      Drugs: none      Exercise:  Three days per week; exercise table/cardio     Seatbelt:  None in 2018   Family History  Problem Relation Age of Onset  . Alcohol abuse Father        Objective:    BP 136/87   Pulse (!) 105   Temp 98 F (36.7 C) (Oral)   Resp 16   Ht 6' 0.83" (1.85 m)   Wt (!) 418 lb (189.6 kg)   SpO2 95%   BMI 55.40 kg/m  Physical Exam  Constitutional: She is oriented to person, place, and time. She appears well-developed and well-nourished. No distress.  HENT:  Head: Normocephalic and atraumatic.  Right Ear: External ear normal.  Left Ear: External ear normal.  Nose: Nose normal.  Mouth/Throat: Oropharynx is clear and moist.  Eyes: Pupils are equal, round, and reactive to light. Conjunctivae and EOM are normal.  Neck: Normal range of motion and full passive range of motion without pain. Neck supple. No JVD present. Carotid bruit is not present. No thyromegaly present.  Cardiovascular: Normal rate, regular rhythm and normal heart sounds.  Exam reveals no gallop and no friction rub.   No murmur heard. Pulmonary/Chest: Effort normal and breath sounds normal. She has no wheezes. She has no rales.  Abdominal: Soft. Bowel sounds are normal. She exhibits no distension and no mass. There is no tenderness. There is no rebound and no guarding.  Musculoskeletal:       Right shoulder: Normal.       Left shoulder: Normal.       Cervical back: Normal.  Lymphadenopathy:    She has no cervical adenopathy.  Neurological: She is alert and oriented to person, place, and time. She has normal reflexes. No cranial nerve deficit. She  exhibits normal muscle tone. Coordination normal.  Skin: Skin is warm and dry. No rash noted. She is not diaphoretic. No erythema. No pallor.  Psychiatric: She has a normal mood and affect. Her behavior is normal. Judgment and thought content normal.  Nursing note and vitals reviewed.  No results found. Depression screen [facility] 2/9 07/08/2017 11/15/2016 09/16/2016 08/03/2016 05/19/2016  Decreased Interest 0 0 0 0 0  Down, Depressed, Hopeless 0 0 0 0 0  PHQ - 2 Score 0 0 0 0 0   Fall Risk  07/08/2017 11/15/2016 09/16/2016 08/03/2016 05/19/2016  Falls in the past year? No No No No No        Assessment & Plan:   1. Routine physical examination   2. Sciatica of right side   3. Morbid obesity (HCC)   4. Essential hypertension   5. Colon cancer screening   6. Need for hepatitis C screening test   7. Screening for HIV (human immunodeficiency virus)   8. Screening for diabetes mellitus   9. Gout of left foot, unspecified cause, unspecified chronicity  10. Fever blister    -anticipatory guidance provided --- exercise, weight loss, safe driving practices, aspirin 81mg  daily. -obtain age appropriate screening labs and labs for chronic disease management. -recommend weight loss, exercise for 30-60 minutes five days per week; recommend 1500 kcal restriction per day with a minimum of 60 grams of protein per day.    Orders Placed This Encounter  Procedures  . CBC with Differential/Platelet  . Comprehensive metabolic panel    Order Specific Question:   Has the patient fasted?    Answer:   No  . Hemoglobin A1c  . Lipid panel    Order Specific Question:   Has the patient fasted?    Answer:   No  . TSH  . HIV antibody  . Hepatitis C antibody  . Ambulatory referral to Gastroenterology    Referral Priority:   Routine    Referral Type:   Consultation    Referral Reason:   Specialty Services Required    Number of Visits Requested:   1  . POCT urinalysis dipstick   Meds ordered this encounter    Medications  . allopurinol (ZYLOPRIM) 100 MG tablet    Sig: Take 1 tablet (100 mg total) by mouth daily.    Dispense:  90 tablet    Refill:  3  . amLODipine (NORVASC) 10 MG tablet    Sig: Take 1 tablet (10 mg total) by mouth daily.    Dispense:  90 tablet    Refill:  3  . colchicine 0.6 MG tablet    Sig: 1 tablet bid daily PRN    Dispense:  12 tablet    Refill:  5  . hydrochlorothiazide (HYDRODIURIL) 12.5 MG tablet    Sig: Take 1 tablet (12.5 mg total) by mouth daily.    Dispense:  90 tablet    Refill:  3  . traMADol (ULTRAM) 50 MG tablet    Sig: Take 1 tablet (50 mg total) by mouth every 8 (eight) hours as needed.    Dispense:  30 tablet    Refill:  1  . valACYclovir (VALTREX) 1000 MG tablet    Sig: 2 po at outbreak onset then repeat in 12h for each outbreak    Dispense:  4 tablet    Refill:  12    Return in about 1 year (around 07/08/2018) for complete physical examiniation.   Nathania Waldman Paulita FujitaMartin Victorian Gunn, M.D. [facility] at [facility]  [place] previously [facility] [address] [place], [place]  [id number] [phone] phone [phone] fax

## 2017-07-08 NOTE — Patient Instructions (Addendum)
IF you received an x-ray today, you will receive an invoice from [facility]. Please contact [facility] at [phone] with questions or concerns regarding your invoice.   IF you received labwork today, you will receive an invoice from [facility]. Please contact [facility] at [phone] with questions or concerns regarding your invoice.   Our billing staff will not be able to assist you with questions regarding bills from these companies.  You will be contacted with the lab results as soon as they are available. The fastest way to get your results is to activate your My Chart account. Instructions are located on the last page of this paperwork. If you have not heard from [place] regarding the results in 2 weeks, please contact this office.      Preventive Care 40-64 Years, Female Preventive care refers to lifestyle choices and visits with your health care provider that can promote health and wellness. What does preventive care include?  A yearly physical exam. This is also called an annual well check.  Dental exams once or twice a year.  Routine eye exams. Ask your health care provider how often you should have your eyes checked.  Personal lifestyle choices, including: ? Daily care of your teeth and gums. ? Regular physical activity. ? Eating a healthy diet. ? Avoiding tobacco and drug use. ? Limiting alcohol use. ? Practicing safe sex. ? Taking low-dose aspirin daily starting at age 34. ? Taking vitamin and mineral supplements as recommended by your health care provider. What happens during an annual well check? The services and screenings done by your health care provider during your annual well check will depend on your age, overall health, lifestyle risk factors, and family history of disease. Counseling Your health care provider may ask you questions about your:  Alcohol use.  Tobacco use.  Drug use.  Emotional well-being.  Home and relationship  well-being.  Sexual activity.  Eating habits.  Work and work Statistician.  Method of birth control.  Menstrual cycle.  Pregnancy history.  Screening You may have the following tests or measurements:  Height, weight, and BMI.  Blood pressure.  Lipid and cholesterol levels. These may be checked every 5 years, or more frequently if you are over 65 years old.  Skin check.  Lung cancer screening. You may have this screening every year starting at age 102 if you have a 30-pack-year history of smoking and currently smoke or have quit within the past 15 years.  Fecal occult blood test (FOBT) of the stool. You may have this test every year starting at age 33.  Flexible sigmoidoscopy or colonoscopy. You may have a sigmoidoscopy every 5 years or a colonoscopy every 10 years starting at age 23.  Hepatitis C blood test.  Hepatitis B blood test.  Sexually transmitted disease (STD) testing.  Diabetes screening. This is done by checking your blood sugar (glucose) after you have not eaten for a while (fasting). You may have this done every 1-3 years.  Mammogram. This may be done every 1-2 years. Talk to your health care provider about when you should start having regular mammograms. This may depend on whether you have a family history of breast cancer.  BRCA-related cancer screening. This may be done if you have a family history of breast, ovarian, tubal, or peritoneal cancers.  Pelvic exam and Pap test. This may be done every 3 years starting at age 3. Starting at age 55, this may be done every 5 years if  you have a Pap test in combination with an HPV test.  Bone density scan. This is done to screen for osteoporosis. You may have this scan if you are at high risk for osteoporosis.  Discuss your test results, treatment options, and if necessary, the need for more tests with your health care provider. Vaccines Your health care provider may recommend certain vaccines, such  as:  Influenza vaccine. This is recommended every year.  Tetanus, diphtheria, and acellular pertussis (Tdap, Td) vaccine. You may need a Td booster every 10 years.  Varicella vaccine. You may need this if you have not been vaccinated.  Zoster vaccine. You may need this after age 70.  Measles, mumps, and rubella (MMR) vaccine. You may need at least one dose of MMR if you were born in 1957 or later. You may also need a second dose.  Pneumococcal 13-valent conjugate (PCV13) vaccine. You may need this if you have certain conditions and were not previously vaccinated.  Pneumococcal polysaccharide (PPSV23) vaccine. You may need one or two doses if you smoke cigarettes or if you have certain conditions.  Meningococcal vaccine. You may need this if you have certain conditions.  Hepatitis A vaccine. You may need this if you have certain conditions or if you travel or work in places where you may be exposed to hepatitis A.  Hepatitis B vaccine. You may need this if you have certain conditions or if you travel or work in places where you may be exposed to hepatitis B.  Haemophilus influenzae type b (Hib) vaccine. You may need this if you have certain conditions.  Talk to your health care provider about which screenings and vaccines you need and how often you need them. This information is not intended to replace advice given to you by your health care provider. Make sure you discuss any questions you have with your health care provider. Document Released: 09/19/2015 Document Revised: 05/12/2016 Document Reviewed: 06/24/2015 Elsevier Interactive Patient Education  2017 [employer].

## 2017-07-09 LAB — CBC WITH DIFFERENTIAL/PLATELET
Basophils Absolute: 0.1 10*3/uL (ref 0.0–0.2)
Basos: 1 %
EOS (ABSOLUTE): 0.3 10*3/uL (ref 0.0–0.4)
EOS: 4 %
HEMATOCRIT: 42.2 % (ref 34.0–46.6)
HEMOGLOBIN: 14.2 g/dL (ref 11.1–15.9)
IMMATURE GRANS (ABS): 0 10*3/uL (ref 0.0–0.1)
IMMATURE GRANULOCYTES: 0 %
LYMPHS: 39 %
Lymphocytes Absolute: 2.6 10*3/uL (ref 0.7–3.1)
MCH: 29.6 pg (ref 26.6–33.0)
MCHC: 33.6 g/dL (ref 31.5–35.7)
MCV: 88 fL (ref 79–97)
MONOCYTES: 6 %
Monocytes Absolute: 0.4 10*3/uL (ref 0.1–0.9)
NEUTROS PCT: 50 %
Neutrophils Absolute: 3.4 10*3/uL (ref 1.4–7.0)
Platelets: 286 10*3/uL (ref 150–379)
RBC: 4.8 x10E6/uL (ref 3.77–5.28)
RDW: 16.2 % — ABNORMAL HIGH (ref 12.3–15.4)
WBC: 6.7 10*3/uL (ref 3.4–10.8)

## 2017-07-09 LAB — COMPREHENSIVE METABOLIC PANEL
ALBUMIN: 4.3 g/dL (ref 3.5–5.5)
ALT: 28 IU/L (ref 0–32)
AST: 29 IU/L (ref 0–40)
Albumin/Globulin Ratio: 1.2 (ref 1.2–2.2)
Alkaline Phosphatase: 90 IU/L (ref 39–117)
BUN / CREAT RATIO: 19 (ref 9–23)
BUN: 17 mg/dL (ref 6–24)
Bilirubin Total: 0.4 mg/dL (ref 0.0–1.2)
CO2: 27 mmol/L (ref 20–29)
CREATININE: 0.91 mg/dL (ref 0.57–1.00)
Calcium: 9.8 mg/dL (ref 8.7–10.2)
Chloride: 98 mmol/L (ref 96–106)
GFR calc Af Amer: 80 mL/min/{1.73_m2} (ref >59)
GFR calc non Af Amer: 69 mL/min/{1.73_m2} (ref >59)
GLUCOSE: 135 mg/dL — AB (ref 65–99)
Globulin, Total: 3.5 g/dL (ref 1.5–4.5)
Potassium: 4.1 mmol/L (ref 3.5–5.2)
Sodium: 143 mmol/L (ref 134–144)
TOTAL PROTEIN: 7.8 g/dL (ref 6.0–8.5)

## 2017-07-09 LAB — HEMOGLOBIN A1C
Est. average glucose Bld gHb Est-mCnc: 166 mg/dL
HEMOGLOBIN A1C: 7.4 % — AB (ref 4.8–5.6)

## 2017-07-09 LAB — HEPATITIS C ANTIBODY

## 2017-07-09 LAB — LIPID PANEL
CHOL/HDL RATIO: 2.9 ratio (ref 0.0–4.4)
CHOLESTEROL TOTAL: 224 mg/dL — AB (ref 100–199)
HDL: 78 mg/dL (ref >39)
LDL Calculated: 129 mg/dL — ABNORMAL HIGH (ref 0–99)
TRIGLYCERIDES: 86 mg/dL (ref 0–149)
VLDL Cholesterol Cal: 17 mg/dL (ref 5–40)

## 2017-07-09 LAB — HIV ANTIBODY (ROUTINE TESTING W REFLEX): HIV Screen 4th Generation wRfx: NONREACTIVE

## 2017-07-09 LAB — TSH: TSH: 8.44 u[IU]/mL — ABNORMAL HIGH (ref 0.450–4.500)

## 2017-11-23 ENCOUNTER — Telehealth: Payer: Self-pay | Admitting: Family Medicine

## 2017-11-23 NOTE — Telephone Encounter (Signed)
Copied from CRM [id number]. Topic: General - Other >> Nov 23, 2017 10:23 AM Cecelia ByarsGreen, Kristiann Noyce L, RMA wrote: Reason for CRM: Patient is requesting a call back from Dr. Katrinka BlazingSmith concerning prescription call in

## 2017-11-24 ENCOUNTER — Ambulatory Visit (INDEPENDENT_AMBULATORY_CARE_PROVIDER_SITE_OTHER): Payer: Self-pay | Admitting: Family Medicine

## 2017-11-24 ENCOUNTER — Other Ambulatory Visit: Payer: Self-pay

## 2017-11-24 ENCOUNTER — Encounter: Payer: Self-pay | Admitting: Family Medicine

## 2017-11-24 VITALS — BP 138/78 | HR 100 | Temp 98.8°F | Ht 73.0 in | Wt >= 6400 oz

## 2017-11-24 DIAGNOSIS — R3915 Urgency of urination: Secondary | ICD-10-CM

## 2017-11-24 DIAGNOSIS — N3000 Acute cystitis without hematuria: Secondary | ICD-10-CM

## 2017-11-24 LAB — POCT URINALYSIS DIP (MANUAL ENTRY)
Bilirubin, UA: NEGATIVE
Glucose, UA: NEGATIVE mg/dL
Ketones, POC UA: NEGATIVE mg/dL
Nitrite, UA: NEGATIVE
Protein Ur, POC: 100 mg/dL — AB
Spec Grav, UA: 1.02 (ref 1.010–1.025)
Urobilinogen, UA: 0.2 E.U./dL
pH, UA: 5.5 (ref 5.0–8.0)

## 2017-11-24 MED ORDER — SULFAMETHOXAZOLE-TRIMETHOPRIM 800-160 MG PO TABS: 1.0000 | ORAL_TABLET | Freq: Two times a day (BID) | ORAL | 0 refills | Status: DC

## 2017-11-24 NOTE — Progress Notes (Signed)
3/21/20198:25 AM  Mckenzie HawkingKathy A [name] 1955-10-24, 62 y.o. female [id number]  Chief Complaint  Patient presents with  . Urinary Tract Infection    THIS IS SOMETHING THAT OCCURS YEARLY PER PT. URINE IS CLOUDY AND FREQUENT    HPI:   Patient is a 62 y.o. female with past medical history significant for HTN and gout who presents today for 3-4 days of urinary urgency and pressure. Denies any dysuria, hematuria, fever, chills, flank pain, nausea or vomiting. Gets a UTI about once a year. Tried cranberry juice without any relief. Usually given bactrim, does well with this. Denies any recent UTI or abx use.   [facility] 11/24/2017 07/08/2017 11/15/2016  Decreased Interest 0 0 0  Down, Depressed, Hopeless 0 0 0  PHQ - 2 Score 0 0 0    No Known Allergies  Prior to Admission medications   Medication Sig Start Date End Date Taking? Authorizing Provider  allopurinol (ZYLOPRIM) 100 MG tablet Take 1 tablet (100 mg total) by mouth daily. 07/08/17  Yes Ethelda ChickSmith, Kristi M, MD  amLODipine (NORVASC) 10 MG tablet Take 1 tablet (10 mg total) by mouth daily. 07/08/17  Yes Ethelda ChickSmith, Kristi M, MD  colchicine 0.6 MG tablet 1 tablet bid daily PRN 07/08/17  Yes Ethelda ChickSmith, Kristi M, MD  hydrochlorothiazide (HYDRODIURIL) 12.5 MG tablet Take 1 tablet (12.5 mg total) by mouth daily. 07/08/17  Yes Ethelda ChickSmith, Kristi M, MD  ibuprofen (ADVIL,MOTRIN) 800 MG tablet Take 1 tablet (800 mg total) by mouth every 8 (eight) hours as needed. 07/08/17  Yes Ethelda ChickSmith, Kristi M, MD  traMADol (ULTRAM) 50 MG tablet Take 1 tablet (50 mg total) by mouth every 8 (eight) hours as needed. 07/08/17  Yes Ethelda ChickSmith, Kristi M, MD  valACYclovir (VALTREX) 1000 MG tablet 2 po at outbreak onset then repeat in 12h for each outbreak 07/08/17  Yes Ethelda ChickSmith, Kristi M, MD    Past Medical History:  Diagnosis Date  . Gout   . Hypertension     History reviewed. No pertinent surgical history.  Social History   Tobacco Use  . Smoking status: Never Smoker  .  Smokeless tobacco: Never Used  Substance Use Topics  . Alcohol use: Yes    Family History  Problem Relation Age of Onset  . Alcohol abuse Father     ROS Per hpi  OBJECTIVE:  Blood pressure 138/78, pulse 100, temperature 98.8 F (37.1 C), temperature source Oral, height 6\' 1"  (1.854 m), weight (!) 420 lb (190.5 kg), SpO2 97 %.  Physical Exam  Constitutional: She is oriented to person, place, and time and well-developed, well-nourished, and in no distress.  HENT:  Head: Normocephalic and atraumatic.  Mouth/Throat: Oropharynx is clear and moist. No oropharyngeal exudate.  Eyes: Pupils are equal, round, and reactive to light. EOM are normal. No scleral icterus.  Neck: Neck supple.  Cardiovascular: Normal rate, regular rhythm and normal heart sounds. Exam reveals no gallop and no friction rub.  No murmur heard. Pulmonary/Chest: Effort normal and breath sounds normal. She has no wheezes. She has no rales.  Abdominal: There is no CVA tenderness.  Musculoskeletal: She exhibits no edema.  Neurological: She is alert and oriented to person, place, and time. Gait normal.  Skin: Skin is warm and dry.      Results for orders placed or performed in visit on 11/24/17 (from the past 24 hour(s))  POCT urinalysis dipstick     Status: Abnormal   Collection Time: 11/24/17  8:24 AM  Result  Value Ref Range   Color, UA yellow yellow   Clarity, UA cloudy (A) clear   Glucose, UA negative negative mg/dL   Bilirubin, UA negative negative   Ketones, POC UA negative negative mg/dL   Spec Grav, UA 4.098 1.191 - 1.025   Blood, UA trace-intact (A) negative   pH, UA 5.5 5.0 - 8.0   Protein Ur, POC =100 (A) negative mg/dL   Urobilinogen, UA 0.2 0.2 or 1.0 E.U./dL   Nitrite, UA Negative Negative   Leukocytes, UA Small (1+) (A) Negative     ASSESSMENT and PLAN  1. Acute cystitis without hematuria - POCT urinalysis dipstick - Urine Culture  2. Urinary urgency  Other orders -  sulfamethoxazole-trimethoprim (BACTRIM DS,SEPTRA DS) 800-160 MG tablet; Take 1 tablet by mouth 2 (two) times daily.  Return if symptoms worsen or fail to improve.    Myles Lipps, MD Primary Care at [facility] [address] [place], [place] [address] Ph.  [phone] Fax [phone]

## 2017-11-24 NOTE — Telephone Encounter (Signed)
Phone call to patient. Per signed release, left detailed message for patient to return call to clinic only if she needs additional medications called in besides ones that were prescribed today, otherwise no need to return call.

## 2017-11-24 NOTE — Patient Instructions (Signed)
     IF you received an x-ray today, you will receive an invoice from [facility]. Please contact [facility] at [phone] with questions or concerns regarding your invoice.   IF you received labwork today, you will receive an invoice from [facility]. Please contact [facility] at [phone] with questions or concerns regarding your invoice.   Our billing staff will not be able to assist you with questions regarding bills from these companies.  You will be contacted with the lab results as soon as they are available. The fastest way to get your results is to activate your My Chart account. Instructions are located on the last page of this paperwork. If you have not heard from us regarding the results in 2 weeks, please contact this office.     

## 2017-11-26 LAB — URINE CULTURE

## 2017-11-27 MED ORDER — NITROFURANTOIN MONOHYD MACRO 100 MG PO CAPS: 100.0000 mg | ORAL_CAPSULE | Freq: Two times a day (BID) | ORAL | 0 refills | Status: DC

## 2017-11-27 NOTE — Addendum Note (Signed)
Addended by: Myles LippsSANTIAGO, Kristi Hyer M on: 11/27/2017 08:17 PM   Modules accepted: Orders

## 2018-01-02 ENCOUNTER — Other Ambulatory Visit: Payer: Self-pay

## 2018-01-02 ENCOUNTER — Encounter: Payer: Self-pay | Admitting: Family Medicine

## 2018-01-02 ENCOUNTER — Ambulatory Visit: Payer: Self-pay | Admitting: Family Medicine

## 2018-01-02 VITALS — BP 151/85 | HR 96 | Temp 98.9°F | Ht 73.0 in | Wt >= 6400 oz

## 2018-01-02 DIAGNOSIS — R946 Abnormal results of thyroid function studies: Secondary | ICD-10-CM

## 2018-01-02 DIAGNOSIS — R739 Hyperglycemia, unspecified: Secondary | ICD-10-CM

## 2018-01-02 DIAGNOSIS — E119 Type 2 diabetes mellitus without complications: Secondary | ICD-10-CM

## 2018-01-02 DIAGNOSIS — I10 Essential (primary) hypertension: Secondary | ICD-10-CM

## 2018-01-02 DIAGNOSIS — M10472 Other secondary gout, left ankle and foot: Secondary | ICD-10-CM

## 2018-01-02 LAB — GLUCOSE, POCT (MANUAL RESULT ENTRY): POC Glucose: 107 mg/dl — AB (ref 70–99)

## 2018-01-02 LAB — POCT GLYCOSYLATED HEMOGLOBIN (HGB A1C): HEMOGLOBIN A1C: 7.7

## 2018-01-02 MED ORDER — PREDNISONE 20 MG PO TABS: ORAL_TABLET | ORAL | 0 refills | Status: DC

## 2018-01-02 NOTE — Progress Notes (Signed)
Subjective:    Patient ID: Mckenzie [name], female    DOB: February 15, 1956, 62 y.o.   MRN: [id number]  01/02/2018  Foot Pain (foot swelling, left foot )    HPI This 62 y.o. female presents for left foot pain and swelling. 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  Taking colchicine two at once; one hour took another.  None today.  Possibly too much asparagus.  Avoiding shrimp; in [place] all last week.  Did eat bang bang shrimp at [facility].    Started up on Allopurinol  daily; still having outbreaks every 6 months. Taking Ibuprofen.  New onset DMII: +3 regular sodas; rare sweet tea.  Little juice. Rare sweets. Exercises four times per week; cardio; 20 minutes. Nocturia x 0.  Drinks 20 ounces of water at bedtime.   BP Readings from Last 3 Encounters:  01/02/18 (!) 151/85  11/24/17 138/78  07/08/17 136/87   Wt Readings from Last 3 Encounters:  01/02/18 (!) 420 lb (190.5 kg)  11/24/17 (!) 420 lb (190.5 kg)  07/08/17 (!) 418 lb (189.6 kg)   Immunization History  Administered Date(s) Administered  . Tdap 07/09/2015    Review of Systems  Constitutional: Negative for chills, diaphoresis, fatigue and fever.  Eyes: Negative for visual disturbance.  Respiratory: Negative for cough and shortness of breath.   Cardiovascular: Negative for chest pain, palpitations and leg swelling.  Gastrointestinal: Negative for abdominal pain, constipation, diarrhea, nausea and vomiting.  Endocrine: Negative for cold intolerance, heat intolerance, polydipsia, polyphagia and polyuria.  Musculoskeletal: Positive for arthralgias, gait problem and joint swelling.  Neurological: Negative for dizziness, tremors, seizures, syncope, facial asymmetry, speech difficulty, weakness, light-headedness, numbness and headaches.    Past Medical History:  Diagnosis Date  . Gout   . Hypertension    No past surgical history on file. No Known Allergies Current  Outpatient Medications on File Prior to Visit  Medication Sig Dispense Refill  . allopurinol (ZYLOPRIM) 100 MG tablet Take 1 tablet (100 mg total) by mouth daily. 90 tablet 3  . amLODipine (NORVASC) 10 MG tablet Take 1 tablet (10 mg total) by mouth daily. 90 tablet 3  . colchicine 0.6 MG tablet 1 tablet bid daily PRN 12 tablet 5  . hydrochlorothiazide (HYDRODIURIL) 12.5 MG tablet Take 1 tablet (12.5 mg total) by mouth daily. 90 tablet 3  . ibuprofen (ADVIL,MOTRIN) 800 MG tablet Take 1 tablet (800 mg total) by mouth every 8 (eight) hours as needed. 30 tablet 0  . traMADol (ULTRAM) 50 MG tablet Take 1 tablet (50 mg total) by mouth every 8 (eight) hours as needed. 30 tablet 1  . valACYclovir (VALTREX) 1000 MG tablet 2 po at outbreak onset then repeat in 12h for each outbreak 4 tablet 12   No current facility-administered medications on file prior to visit.    Social History   Socioeconomic History  . Marital status: Single    Spouse name: Not on file  . Number of children: 0  . Years of education: Not on file  . Highest education level: Not on file  Occupational History  . Occupation: MISSIONARY    Employer: [employer]  Social Needs  . Financial resource strain: Not on file  . Food insecurity:    Worry: Not on file    Inability: Not on file  . Transportation needs:    Medical: Not on file    Non-medical: Not on file  Tobacco Use  . Smoking status:  Never Smoker  . Smokeless tobacco: Never Used  Substance and Sexual Activity  . Alcohol use: Yes  . Drug use: No  . Sexual activity: Not Currently    Birth control/protection: Post-menopausal  Lifestyle  . Physical activity:    Days per week: Not on file    Minutes per session: Not on file  . Stress: Not on file  Relationships  . Social connections:    Talks on phone: Not on file    Gets together: Not on file    Attends religious service: Not on file    Active member of club or organization: Not on file     Attends meetings of clubs or organizations: Not on file    Relationship status: Not on file  . Intimate partner violence:    Fear of current or ex partner: Not on file    Emotionally abused: Not on file    Physically abused: Not on file    Forced sexual activity: Not on file  Other Topics Concern  . Not on file  Social History Narrative   Marital status:  Single; dating loves to date!      Children: none       Lives: alone in huge farm house      Employment: [name]; full time; missionary work.  Speaking engagements.      Tobacco: none      Alcohol: socially      Drugs: none      Exercise:  Three days per week; exercise table/cardio     Seatbelt:  None in 2018   Family History  Problem Relation Age of Onset  . Alcohol abuse Father        Objective:    BP (!) 151/85 (BP Location: Left Arm, Patient Position: Sitting, Cuff Size: Normal)   Pulse 96   Temp 98.9 F (37.2 C) (Oral)   Ht  (1.854 m)   Wt (!) 420 lb (190.5 kg)   SpO2 96%   BMI 55.41 kg/m  Physical Exam  Constitutional: She is oriented to person, place, and time. She appears well-developed and well-nourished. No distress.  HENT:  Head: Normocephalic and atraumatic.  Right Ear: External ear normal.  Left Ear: External ear normal.  Nose: Nose normal.  Mouth/Throat: Oropharynx is clear and moist.  Eyes: Pupils are equal, round, and reactive to light. Conjunctivae and EOM are normal.  Neck: Normal range of motion. Neck supple. Carotid bruit is not present. No thyromegaly present.  Cardiovascular: Normal rate, regular rhythm, normal heart sounds and intact distal pulses. Exam reveals no gallop and no friction rub.  No murmur heard. Trace-1+ pitting edema B lower extremities to lower calves B.  Pulmonary/Chest: Effort normal and breath sounds normal. She has no wheezes. She has no rales.  Abdominal: Soft. Bowel sounds are normal. She exhibits no distension and no mass. There is no tenderness. There is no rebound  and no guarding.  Musculoskeletal: She exhibits edema.       Left ankle: Normal. She exhibits normal range of motion, no swelling, no ecchymosis and no deformity. No tenderness. No lateral malleolus and no medial malleolus tenderness found. Achilles tendon normal.       Left foot: There is decreased range of motion, tenderness, bony tenderness and swelling. There is normal capillary refill, no crepitus, no deformity and no laceration.  Lymphadenopathy:    She has no cervical adenopathy.  Neurological: She is alert and oriented to person, place, and time. No cranial  nerve deficit.  Skin: Skin is warm and dry. No rash noted. She is not diaphoretic. No erythema. No pallor.  Psychiatric: She has a normal mood and affect. Her behavior is normal.   No results found. Depression screen [facility] 2/9 01/02/2018 11/24/2017 07/08/2017 11/15/2016 09/16/2016  Decreased Interest 0 0 0 0 0  Down, Depressed, Hopeless 0 0 0 0 0  PHQ - 2 Score 0 0 0 0 0   Fall Risk  01/02/2018 07/08/2017 11/15/2016 09/16/2016 08/03/2016  Falls in the past year? No No No No No    Results for orders placed or performed in visit on 01/02/18  POCT glucose (manual entry)  Result Value Ref Range   POC Glucose 107 (A) 70 - 99 mg/dl  POCT glycosylated hemoglobin (Hb A1C)  Result Value Ref Range   Hemoglobin A1C 7.7        Assessment & Plan:   1. Essential hypertension   2. Hyperglycemia   3. Thyroid function study abnormality   4. Acute gout due to other secondary cause involving toe of left foot    Acute gouty flare in L foot: New onset; rx for Prednisone provided; continue Allopurinol and Colchicine.   Hyperglycemia/new onset DMII: New onset; pt declined medication today; plans to eliminate sweetened beverages, exercise, lose weight.   I recommend weight loss, exercise, and low-carbohydrate low-sugar food choices. You should AVOID: regular sodas, sweetened tea, fruit juices.  You should LIMIT: breads, pastas, rice, potatoes, and  desserts/sweets.  I would recommend limiting your total carbohydrate intake per meal to 45 grams; I would limit your total carbohydrate intake per snack to 30 grams.  I would also have a goal of 60 grams of protein intake per day; this would equal 10-15 grams of protein per meal and 5-10 grams of protein per snack.  HTN: poorly controlled today yet recent acute pain with gout flare.   Thyroid function abnormal: at last visit; repeat today.  Morbid obesity: Recommend weight loss, exercise for 30-60 minutes five days per week; recommend 1200 kcal restriction per day with a minimum of 60 grams of protein per day.  Eat 3 meals per day. Do not skip meals. Consider having a protein shake as a meal replacement to aid with eliminating meal skipping. Look for products with <220 calories, <7 gm sugar, and 20-30 gm protein.  Eat breakfast within 2 hours of getting up.   Make  your plate non-starchy vegetables,  protein, and  carbohydrates at lunch and dinner.   Aim for at least 64 oz. of calorie-free beverages daily (water, Crystal Light, diet green tea, etc.). Eliminate any sugary beverages such as regular soda, sweet tea, or fruit juice.   Pay attention to hunger and fullness cues.  Stop eating once you feel satisfied; don't wait until you feel full, stuffed, or sick from eating.  Choose lean meats and low fat/fat free dairy products.  Choose foods high in fiber such as fruits, vegetables, and whole grains (brown rice, whole wheat pasta, whole wheat bread, etc.).  Limit foods with added sugar to <7 gm per serving.  Always eat in the kitchen/dining room.  Never eat in the bedroom or in front of the TV.    Orders Placed This Encounter  Procedures  . CBC with Differential/Platelet  . Comprehensive metabolic panel  . Uric acid  . TSH  . POCT glucose (manual entry)  . POCT glycosylated hemoglobin (Hb A1C)   Meds ordered this encounter  Medications  . predniSONE (DELTASONE)  20 MG tablet     Sig: Take 3 PO QAM x 1 day, 2 PO QAM x 5 days, 1 PO QAM x 5 days    Dispense:  18 tablet    Refill:  0    No follow-ups on file.   Magan Winnett Paulita Fujita, M.D. Primary Care at [facility] previously [facility] [address] [place], [place]  [address] [phone] phone [phone] fax

## 2018-01-02 NOTE — Patient Instructions (Signed)
Diabetes Mellitus and Standards of Medical Care Managing diabetes (diabetes mellitus) can be complicated. Your diabetes treatment may be managed by a team of health care providers, including:  A diet and nutrition specialist (registered dietitian).  A nurse.  A certified diabetes educator (CDE).  A diabetes specialist (endocrinologist).  An eye doctor.  A primary care provider.  A dentist.  Your health care providers follow a schedule in order to help you get the best quality of care. The following schedule is a general guideline for your diabetes management plan. Your health care providers may also give you more specific instructions. HbA1c ( hemoglobin A1c) test This test provides information about blood sugar (glucose) control over the previous 2-3 months. It is used to check whether your diabetes management plan needs to be adjusted.  If you are meeting your treatment goals, this test is done at least 2 times a year.  If you are not meeting treatment goals or if your treatment goals have changed, this test is done 4 times a year.  Blood pressure test  This test is done at every routine medical visit. For most people, the goal is less than 130/80. Ask your health care provider what your goal blood pressure should be. Dental and eye exams  Visit your dentist two times a year.  If you have type 1 diabetes, get an eye exam 3-5 years after you are diagnosed, and then once a year after your first exam. ? If you were diagnosed with type 1 diabetes as a child, get an eye exam when you are age 16 or older and have had diabetes for 3-5 years. After the first exam, you should get an eye exam once a year.  If you have type 2 diabetes, have an eye exam as soon as you are diagnosed, and then once a year after your first exam. Foot care exam  Visual foot exams are done at every routine medical visit. The exams check for cuts, bruises, redness, blisters, sores, or other problems with the  feet.  A complete foot exam is done by your health care provider once a year. This exam includes an inspection of the structure and skin of your feet, and a check of the pulses and sensation in your feet. ? Type 1 diabetes: Get your first exam 3-5 years after diagnosis. ? Type 2 diabetes: Get your first exam as soon as you are diagnosed.  Check your feet every day for cuts, bruises, redness, blisters, or sores. If you have any of these or other problems that are not healing, contact your health care provider. Kidney function test ( urine microalbumin)  This test is done once a year. ? Type 1 diabetes: Get your first test 5 years after diagnosis. ? Type 2 diabetes: Get your first test as soon as you are diagnosed.  If you have chronic kidney disease (CKD), get a serum creatinine and estimated glomerular filtration rate (eGFR) test once a year. Lipid profile (cholesterol, HDL, LDL, triglycerides)  This test should be done when you are diagnosed with diabetes, and every 5 years after the first test. If you are on medicines to lower your cholesterol, you may need to get this test done every year. ? The goal for LDL is less than 100 mg/dL (5.5 mmol/L). If you are at high risk, the goal is less than 70 mg/dL (3.9 mmol/L). ? The goal for HDL is 40 mg/dL (2.2 mmol/L) for men and 50 mg/dL(2.8 mmol/L) for women. An HDL  cholesterol of 60 mg/dL (3.3 mmol/L) or higher gives some protection against heart disease. ? The goal for triglycerides is less than 150 mg/dL (8.3 mmol/L). Immunizations  The yearly flu (influenza) vaccine is recommended for everyone 6 months or older who has diabetes.  The pneumonia (pneumococcal) vaccine is recommended for everyone 2 years or older who has diabetes. If you are 97 or older, you may get the pneumonia vaccine as a series of two separate shots.  The hepatitis B vaccine is recommended for adults shortly after they have been diagnosed with diabetes.  The Tdap  (tetanus, diphtheria, and pertussis) vaccine should be given: ? According to normal childhood vaccination schedules, for children. ? Every 10 years, for adults who have diabetes.  The shingles vaccine is recommended for people who have had chicken pox and are 50 years or older. Mental and emotional health  Screening for symptoms of eating disorders, anxiety, and depression is recommended at the time of diagnosis and afterward as needed. If your screening shows that you have symptoms (you have a positive screening result), you may need further evaluation and be referred to a mental health care provider. Diabetes self-management education  Education about how to manage your diabetes is recommended at diagnosis and ongoing as needed. Treatment plan  Your treatment plan will be reviewed at every medical visit. Summary  Managing diabetes (diabetes mellitus) can be complicated. Your diabetes treatment may be managed by a team of health care providers.  Your health care providers follow a schedule in order to help you get the best quality of care.  Standards of care including having regular physical exams, blood tests, blood pressure monitoring, immunizations, screening tests, and education about how to manage your diabetes.  Your health care providers may also give you more specific instructions based on your individual health. This information is not intended to replace advice given to you by your health care provider. Make sure you discuss any questions you have with your health care provider. Document Released: 06/20/2009 Document Revised: 05/21/2016 Document Reviewed: 05/21/2016 Elsevier Interactive Patient Education  [employer].

## 2018-01-03 LAB — CBC WITH DIFFERENTIAL/PLATELET
BASOS: 1 %
Basophils Absolute: 0 10*3/uL (ref 0.0–0.2)
EOS (ABSOLUTE): 0.3 10*3/uL (ref 0.0–0.4)
EOS: 4 %
HEMATOCRIT: 43.4 % (ref 34.0–46.6)
Hemoglobin: 14.4 g/dL (ref 11.1–15.9)
IMMATURE GRANULOCYTES: 0 %
Immature Grans (Abs): 0 10*3/uL (ref 0.0–0.1)
Lymphocytes Absolute: 3 10*3/uL (ref 0.7–3.1)
Lymphs: 46 %
MCH: 30 pg (ref 26.6–33.0)
MCHC: 33.2 g/dL (ref 31.5–35.7)
MCV: 90 fL (ref 79–97)
MONOS ABS: 0.4 10*3/uL (ref 0.1–0.9)
Monocytes: 6 %
NEUTROS ABS: 2.8 10*3/uL (ref 1.4–7.0)
NEUTROS PCT: 43 %
Platelets: 298 10*3/uL (ref 150–379)
RBC: 4.8 x10E6/uL (ref 3.77–5.28)
RDW: 15.5 % — ABNORMAL HIGH (ref 12.3–15.4)
WBC: 6.5 10*3/uL (ref 3.4–10.8)

## 2018-01-03 LAB — COMPREHENSIVE METABOLIC PANEL
A/G RATIO: 1.1 — AB (ref 1.2–2.2)
ALT: 37 IU/L — ABNORMAL HIGH (ref 0–32)
AST: 32 IU/L (ref 0–40)
Albumin: 4 g/dL (ref 3.6–4.8)
Alkaline Phosphatase: 86 IU/L (ref 39–117)
BUN / CREAT RATIO: 14 (ref 12–28)
BUN: 12 mg/dL (ref 8–27)
Bilirubin Total: 0.4 mg/dL (ref 0.0–1.2)
CALCIUM: 9.3 mg/dL (ref 8.7–10.3)
CO2: 29 mmol/L (ref 20–29)
Chloride: 101 mmol/L (ref 96–106)
Creatinine, Ser: 0.84 mg/dL (ref 0.57–1.00)
GFR, EST AFRICAN AMERICAN: 87 mL/min/{1.73_m2} (ref >59)
GFR, EST NON AFRICAN AMERICAN: 76 mL/min/{1.73_m2} (ref >59)
Globulin, Total: 3.8 g/dL (ref 1.5–4.5)
Glucose: 112 mg/dL — ABNORMAL HIGH (ref 65–99)
POTASSIUM: 3.8 mmol/L (ref 3.5–5.2)
Sodium: 144 mmol/L (ref 134–144)
TOTAL PROTEIN: 7.8 g/dL (ref 6.0–8.5)

## 2018-01-03 LAB — URIC ACID: Uric Acid: 9 mg/dL — ABNORMAL HIGH (ref 2.5–7.1)

## 2018-01-03 LAB — TSH: TSH: 3.3 u[IU]/mL (ref 0.450–4.500)

## 2018-01-12 ENCOUNTER — Ambulatory Visit (INDEPENDENT_AMBULATORY_CARE_PROVIDER_SITE_OTHER): Payer: Self-pay | Admitting: Family Medicine

## 2018-01-12 ENCOUNTER — Encounter: Payer: Self-pay | Admitting: Family Medicine

## 2018-01-12 ENCOUNTER — Other Ambulatory Visit: Payer: Self-pay

## 2018-01-12 ENCOUNTER — Ambulatory Visit (INDEPENDENT_AMBULATORY_CARE_PROVIDER_SITE_OTHER): Payer: Self-pay

## 2018-01-12 VITALS — BP 120/80 | HR 100 | Temp 98.1°F | Ht 73.0 in | Wt >= 6400 oz

## 2018-01-12 DIAGNOSIS — R05 Cough: Secondary | ICD-10-CM

## 2018-01-12 DIAGNOSIS — R059 Cough, unspecified: Secondary | ICD-10-CM

## 2018-01-12 DIAGNOSIS — J22 Unspecified acute lower respiratory infection: Secondary | ICD-10-CM

## 2018-01-12 DIAGNOSIS — R0602 Shortness of breath: Secondary | ICD-10-CM

## 2018-01-12 LAB — POCT CBC
Granulocyte percent: 66.4 %G (ref 37–80)
HCT, POC: 42.2 % (ref 37.7–47.9)
Hemoglobin: 13.6 g/dL (ref 12.2–16.2)
Lymph, poc: 3 (ref 0.6–3.4)
MCH, POC: 29.2 pg (ref 27–31.2)
MCHC: 32.1 g/dL (ref 31.8–35.4)
MCV: 90.9 fL (ref 80–97)
MID (cbc): 0.8 (ref 0–0.9)
MPV: 7.9 fL (ref 0–99.8)
POC Granulocyte: 7.6 — AB (ref 2–6.9)
POC LYMPH PERCENT: 26.7 %L (ref 10–50)
POC MID %: 6.6 %M (ref 0–12)
Platelet Count, POC: 269 10*3/uL (ref 142–424)
RBC: 4.64 M/uL (ref 4.04–5.48)
RDW, POC: 15.6 %
WBC: 11.4 10*3/uL — AB (ref 4.6–10.2)

## 2018-01-12 MED ORDER — BENZONATATE 100 MG PO CAPS: 100.0000 mg | ORAL_CAPSULE | Freq: Three times a day (TID) | ORAL | 0 refills | Status: DC | PRN

## 2018-01-12 MED ORDER — AZITHROMYCIN 250 MG PO TABS: ORAL_TABLET | ORAL | 0 refills | Status: AC

## 2018-01-12 NOTE — Patient Instructions (Addendum)
1. You do not have pneumonia 2. You do have a lower respiratory infection. The great majority of these are viral and resolve on their own, however they tend to take a 2-3 weeks. We managed with supportive measures. I am sending tessalon pearls for cough. I recommend mucinex-DM, use of humidifier at nighttime, drinks lots of liquids.  3. I am sending a prescription for azithromycin. I do not think you need it now. Reasons for which to take: start having a fever, getting worse over the next week.     Acute Bronchitis, Adult Acute bronchitis is when air tubes (bronchi) in the lungs suddenly get swollen. The condition can make it hard to breathe. It can also cause these symptoms:  A cough.  Coughing up clear, yellow, or green mucus.  Wheezing.  Chest congestion.  Shortness of breath.  A fever.  Body aches.  Chills.  A sore throat.  Follow these instructions at home: Medicines  Take over-the-counter and prescription medicines only as told by your doctor.  If you were prescribed an antibiotic medicine, take it as told by your doctor. Do not stop taking the antibiotic even if you start to feel better. General instructions  Rest.  Drink enough fluids to keep your pee (urine) clear or pale yellow.  Avoid smoking and secondhand smoke. If you smoke and you need help quitting, ask your doctor. Quitting will help your lungs heal faster.  Use an inhaler, cool mist vaporizer, or humidifier as told by your doctor.  Keep all follow-up visits as told by your doctor. This is important. How is this prevented? To lower your risk of getting this condition again:  Wash your hands often with soap and water. If you cannot use soap and water, use hand sanitizer.  Avoid contact with people who have cold symptoms.  Try not to touch your hands to your mouth, nose, or eyes.  Make sure to get the flu shot every year.  Contact a doctor if:  Your symptoms do not get better in 2 weeks. Get  help right away if:  You cough up blood.  You have chest pain.  You have very bad shortness of breath.  You become dehydrated.  You faint (pass out) or keep feeling like you are going to pass out.  You keep throwing up (vomiting).  You have a very bad headache.  Your fever or chills gets worse. This information is not intended to replace advice given to you by your health care provider. Make sure you discuss any questions you have with your health care provider. Document Released: 02/09/2008 Document Revised: 03/31/2016 Document Reviewed: 02/11/2016 Elsevier Interactive Patient Education  2018 [employer].  IF you received an x-ray today, you will receive an invoice from [facility]. Please contact [facility] at [phone] with questions or concerns regarding your invoice.   IF you received labwork today, you will receive an invoice from [facility]. Please contact [facility] at [phone] with questions or concerns regarding your invoice.   Our billing staff will not be able to assist you with questions regarding bills from these companies.  You will be contacted with the lab results as soon as they are available. The fastest way to get your results is to activate your My Chart account. Instructions are located on the last page of this paperwork. If you have not heard from [place] regarding the results in 2 weeks, please contact this office.

## 2018-01-12 NOTE — Progress Notes (Signed)
5/9/20198:40 AM  Mckenzie [name] 19-Jun-1956, 62 y.o. female [id number]  Chief Complaint  Patient presents with  . Sore Throat    was given antiboitics that are not working, still sniffling and producing heavy mucus with sob    HPI:   Patient is a 63 y.o. female with past medical history significant for HTN, hyperglycemia and gout who presents today for 8 days of productive cough and SOB Patient was last seen here on 4/29 - treated with pred for gout Then went away to a conference and on her way back started having severe sore throat, swollen, was drinking with a dropper, [facility] and [facility] with [facility]ox[facility]  BID, today on day 8 Her throat is much better, able to drink normally She however is having worsening productive cough, SOB, her chest hurts with deep breathing She denies any fever or chills. She is feeling fatigued She has no nasal congestion or sinus pain She had very short lived right ear pain  Fall Risk  01/12/2018 01/02/2018 07/08/2017 11/15/2016 09/16/2016  Falls in the past year? No No No No No     Depression screen [facility] 2/9 01/12/2018 01/02/2018 11/24/2017  Decreased Interest 0 0 0  Down, Depressed, Hopeless 0 0 0  PHQ - 2 Score 0 0 0    No Known Allergies  Prior to Admission medications   Medication Sig Start Date End Date Taking? Authorizing Provider  amLODipine (NORVASC) 10 MG tablet Take 1 tablet (10 mg total) by mouth daily. 07/08/17  Yes Ethelda Chick, MD  hydrochlorothiazide (HYDRODIURIL) 12.5 MG tablet Take 1 tablet (12.5 mg total) by mouth daily. 07/08/17  Yes Ethelda Chick, MD  ibuprofen (ADVIL,MOTRIN) 800 MG tablet Take 1 tablet (800 mg total) by mouth every 8 (eight) hours as needed. 07/08/17  Yes Ethelda Chick, MD  valACYclovir (VALTREX) 1000 MG tablet 2 po at outbreak onset then repeat in 12h for each outbreak 07/08/17  Yes Ethelda Chick, MD    Past Medical History:  Diagnosis Date  . Gout   . Hypertension     History reviewed. No  pertinent surgical history.  Social History   Tobacco Use  . Smoking status: Never Smoker  . Smokeless tobacco: Never Used  Substance Use Topics  . Alcohol use: Yes    Family History  Problem Relation Age of Onset  . Alcohol abuse Father     ROS Per hpi  OBJECTIVE:  Blood pressure 120/80, pulse 100, temperature 98.1 F (36.7 C), temperature source Oral, height  (1.854 m), weight (!) 420 lb (190.5 kg), SpO2 95 %.  Physical Exam  Constitutional: She is oriented to person, place, and time. She appears well-developed and well-nourished.  HENT:  Head: Normocephalic and atraumatic.  Right Ear: Hearing, tympanic membrane, external ear and ear canal normal.  Left Ear: Hearing, tympanic membrane, external ear and ear canal normal.  Nose: No mucosal edema or rhinorrhea. Right sinus exhibits no maxillary sinus tenderness and no frontal sinus tenderness. Left sinus exhibits no maxillary sinus tenderness and no frontal sinus tenderness.  Mouth/Throat: Oropharynx is clear and moist and mucous membranes are normal. Tonsils are 2+ on the right. Tonsils are 2+ on the left.  Eyes: Pupils are equal, round, and reactive to light. Conjunctivae and EOM are normal.  Neck: Neck supple.  Cardiovascular: Normal rate, regular rhythm and normal heart sounds. Exam reveals no gallop and no friction rub.  No murmur heard. Pulmonary/Chest: Effort normal and breath  sounds normal. She has no wheezes. She has no rhonchi. She has no rales.  Lymphadenopathy:    She has no cervical adenopathy.  Neurological: She is alert and oriented to person, place, and time.  Skin: Skin is warm and dry.  Nursing note and vitals reviewed.    Results for orders placed or performed in visit on 01/12/18 (from the past 24 hour(s))  POCT CBC     Status: Abnormal   Collection Time: 01/12/18  9:06 AM  Result Value Ref Range   WBC 11.4 (A) 4.6 - 10.2 K/uL   Lymph, poc 3.0 0.6 - 3.4   POC LYMPH PERCENT 26.7 10 - 50 %L    MID (cbc) 0.8 0 - 0.9   POC MID % 6.6 0 - 12 %M   POC Granulocyte 7.6 (A) 2 - 6.9   Granulocyte percent 66.4 37 - 80 %G   RBC 4.64 4.04 - 5.48 M/uL   Hemoglobin 13.6 12.2 - 16.2 g/dL   HCT, POC 14.7 82.9 - 47.9 %   MCV 90.9 80 - 97 fL   MCH, POC 29.2 27 - 31.2 pg   MCHC 32.1 31.8 - 35.4 g/dL   RDW, POC 56.2 %   Platelet Count, POC 269 142 - 424 K/uL   MPV 7.9 0 - 99.8 fL    Dg Chest 2 View  Result Date: 01/12/2018 CLINICAL DATA:  62 year old female with a history of cough and shortness of breath EXAM: CHEST - 2 VIEW COMPARISON:  04/30/2008 FINDINGS: Cardiomediastinal silhouette unchanged in size and contour. No evidence of central vascular congestion. No pneumothorax or pleural effusion. No confluent airspace disease. Low lung volumes persist. Coarsened interstitial markings are similar prior. No displaced fracture. IMPRESSION: Low lung volumes without evidence of acute cardiopulmonary disease Electronically Signed   By: Gilmer Mor D.O.   On: 01/12/2018 09:30     ASSESSMENT and PLAN  1. Lower respiratory tract infection Discussed this is a viral bronchitis, no signs of bacterial infection, mild elevation in WBC is from recent pred, no bands or toxic granulocytes seen. No fever. cxr stable. Discussed supportive measures, delayed abx use and RTC precautions.  2. Cough - POCT CBC - DG Chest 2 View; Future  3. SOB (shortness of breath) - POCT CBC - DG Chest 2 View; Future  Other orders - azithromycin (ZITHROMAX) 250 MG tablet; Take 2 tablets (500 mg total) by mouth daily for 1 day, THEN 1 tablet (250 mg total) daily for 4 days. - benzonatate (TESSALON) 100 MG capsule; Take 1-2 capsules (100-200 mg total) by mouth 3 (three) times daily as needed for cough.  Return if symptoms worsen or fail to improve.    Myles Lipps, MD Primary Care at [facility] [address] [place], [place] [address] Ph.  [phone] Fax [phone]

## 2018-01-30 ENCOUNTER — Encounter: Payer: Self-pay | Admitting: Family Medicine

## 2018-02-06 ENCOUNTER — Other Ambulatory Visit: Payer: Self-pay | Admitting: Obstetrics and Gynecology

## 2018-02-06 DIAGNOSIS — Z1231 Encounter for screening mammogram for malignant neoplasm of breast: Secondary | ICD-10-CM

## 2018-02-28 ENCOUNTER — Ambulatory Visit
Admission: RE | Admit: 2018-02-28 | Discharge: 2018-02-28 | Disposition: A | Discharge status: Home / Self Care | Payer: Self-pay | Source: Ambulatory Visit | Attending: Obstetrics and Gynecology | Admitting: Obstetrics and Gynecology

## 2018-02-28 DIAGNOSIS — Z1231 Encounter for screening mammogram for malignant neoplasm of breast: Secondary | ICD-10-CM

## 2018-03-05 DIAGNOSIS — E119 Type 2 diabetes mellitus without complications: Secondary | ICD-10-CM | POA: Insufficient documentation

## 2018-03-05 DIAGNOSIS — M109 Gout, unspecified: Secondary | ICD-10-CM | POA: Insufficient documentation

## 2018-03-20 ENCOUNTER — Ambulatory Visit: Payer: Self-pay | Admitting: Family Medicine

## 2018-03-20 ENCOUNTER — Other Ambulatory Visit: Payer: Self-pay

## 2018-03-20 ENCOUNTER — Encounter: Payer: Self-pay | Admitting: Family Medicine

## 2018-03-20 VITALS — BP 128/72 | HR 98 | Temp 98.5°F | Resp 16 | Ht 72.0 in | Wt 380.0 lb

## 2018-03-20 DIAGNOSIS — M1A1720 Lead-induced chronic gout, left ankle and foot, without tophus (tophi): Secondary | ICD-10-CM

## 2018-03-20 DIAGNOSIS — E119 Type 2 diabetes mellitus without complications: Secondary | ICD-10-CM

## 2018-03-20 DIAGNOSIS — M1A9XX0 Chronic gout, unspecified, without tophus (tophi): Secondary | ICD-10-CM

## 2018-03-20 DIAGNOSIS — B001 Herpesviral vesicular dermatitis: Secondary | ICD-10-CM

## 2018-03-20 DIAGNOSIS — T560X1A Toxic effect of lead and its compounds, accidental (unintentional), initial encounter: Secondary | ICD-10-CM

## 2018-03-20 DIAGNOSIS — Z7184 Encounter for health counseling related to travel: Secondary | ICD-10-CM

## 2018-03-20 DIAGNOSIS — I10 Essential (primary) hypertension: Secondary | ICD-10-CM

## 2018-03-20 LAB — POCT GLYCOSYLATED HEMOGLOBIN (HGB A1C): HEMOGLOBIN A1C: 6.5 % — AB (ref 4.0–5.6)

## 2018-03-20 LAB — GLUCOSE, POCT (MANUAL RESULT ENTRY): POC Glucose: 103 mg/dl — AB (ref 70–99)

## 2018-03-20 MED ORDER — COLCHICINE 0.6 MG PO TABS: 0.6000 mg | ORAL_TABLET | Freq: Two times a day (BID) | ORAL | 5 refills | Status: DC | PRN

## 2018-03-20 MED ORDER — IBUPROFEN 800 MG PO TABS: 800.0000 mg | ORAL_TABLET | Freq: Three times a day (TID) | ORAL | 0 refills | Status: DC | PRN

## 2018-03-20 MED ORDER — HYDROCHLOROTHIAZIDE 12.5 MG PO TABS: 12.5000 mg | ORAL_TABLET | Freq: Every day | ORAL | 3 refills | Status: DC

## 2018-03-20 MED ORDER — AMLODIPINE BESYLATE 10 MG PO TABS: 10.0000 mg | ORAL_TABLET | Freq: Every day | ORAL | 3 refills | Status: DC

## 2018-03-20 MED ORDER — ALLOPURINOL 300 MG PO TABS: 300.0000 mg | ORAL_TABLET | Freq: Every day | ORAL | 3 refills | Status: DC

## 2018-03-20 MED ORDER — NITROFURANTOIN MONOHYD MACRO 100 MG PO CAPS: 100.0000 mg | ORAL_CAPSULE | Freq: Two times a day (BID) | ORAL | 1 refills | Status: DC

## 2018-03-20 MED ORDER — TRAMADOL HCL 50 MG PO TABS: 50.0000 mg | ORAL_TABLET | Freq: Three times a day (TID) | ORAL | 0 refills | Status: DC | PRN

## 2018-03-20 MED ORDER — VALACYCLOVIR HCL 1 G PO TABS: ORAL_TABLET | ORAL | 12 refills | Status: DC

## 2018-03-20 NOTE — Progress Notes (Signed)
Subjective:    Patient ID: Mckenzie [name], female    DOB: 1956/05/20, 62 y.o.   MRN: [id number]  03/20/2018  Annual Exam    HPI This 62 y.o. female presents for six month follow-up of hypertension, DMII, gout, HSV labialis.  Last physical: Pap smear:  02-2018. Dr. Vena Austria Greene/gynecology.  [facility] Mammogram:  02-2018 [facility]/breast cancer Colonoscopy:  Dr. Loreta Ave on [address]; scheduled for November.  Ten years ago at age 57. Bone density:  2018 Eye exam: 2019 Dental exam: 2019  DMII: eating a lot of meat and vegetables; chicken and [place].  No sugar drinks.  Has lost significant weight since last visit.  Determined to avoid medication.    Gout: recurrent gout.  Uric acid 9.0; allopurinol 200mg  daily.  Repeat uric acid level in July WNL.  Needs refill of colchicine.  Having recurrent attacks of gout in feet; also having R lateral ankle pain when L foot has gouty attack.   Postmenopausal bleeding: pap smear; pelvic US; endometrial bx; negative. Fibroids.  HTN: Patient reports good compliance with medication, good tolerance to medication, and good symptom control.  Not checking Bp at home.  Travel to [place]: Will be leaving for [place] for 2 weeks.  Requesting ibuprofen, tramadol, antibiotic therapy.   Visual Acuity Screening   Right eye Left eye Both eyes  Without correction:     With correction: 20/20 20/20 20/20     BP Readings from Last 3 Encounters:  03/20/18 128/72  01/12/18 120/80  01/02/18 (!) 151/85   Wt Readings from Last 3 Encounters:  03/20/18 (!) 380 lb (172.4 kg)  01/12/18 (!) 420 lb (190.5 kg)  01/02/18 (!) 420 lb (190.5 kg)   Immunization History  Administered Date(s) Administered  . Tdap 07/09/2015   Health Maintenance  Topic Date Due  . PNEUMOCOCCAL POLYSACCHARIDE VACCINE (1) 08/18/1959  . URINE MICROALBUMIN  08/18/1967  . INFLUENZA VACCINE  04/06/2018  . HEMOGLOBIN A1C  09/20/2018  . OPHTHALMOLOGY EXAM  02/14/2019  . FOOT  EXAM  03/21/2019  . PAP SMEAR  02/23/2020  . MAMMOGRAM  02/29/2020  . TETANUS/TDAP  07/08/2025  . COLONOSCOPY  01/13/2028  . Hepatitis C Screening  Completed  . HIV Screening  Completed   Review of Systems  Constitutional: Negative for activity change, appetite change, chills, diaphoresis, fatigue, fever and unexpected weight change.  HENT: Negative for congestion, dental problem, drooling, ear discharge, ear pain, facial swelling, hearing loss, mouth sores, nosebleeds, postnasal drip, rhinorrhea, sinus pressure, sneezing, sore throat, tinnitus, trouble swallowing and voice change.   Eyes: Negative for photophobia, pain, discharge, redness, itching and visual disturbance.  Respiratory: Negative for apnea, cough, choking, chest tightness, shortness of breath, wheezing and stridor.   Cardiovascular: Negative for chest pain, palpitations and leg swelling.  Gastrointestinal: Negative for abdominal distention, abdominal pain, anal bleeding, blood in stool, constipation, diarrhea, nausea, rectal pain and vomiting.  Endocrine: Negative for cold intolerance, heat intolerance, polydipsia, polyphagia and polyuria.  Genitourinary: Negative for decreased urine volume, difficulty urinating, dyspareunia, dysuria, enuresis, flank pain, frequency, genital sores, hematuria, menstrual problem, pelvic pain, urgency, vaginal bleeding, vaginal discharge and vaginal pain.  Musculoskeletal: Positive for arthralgias, gait problem and joint swelling. Negative for back pain, myalgias, neck pain and neck stiffness.  Skin: Negative for color change, pallor, rash and wound.  Allergic/Immunologic: Negative for environmental allergies, food allergies and immunocompromised state.  Neurological: Negative for dizziness, tremors, seizures, syncope, facial asymmetry, speech difficulty, weakness, light-headedness, numbness and headaches.  Hematological: Negative for  adenopathy. Does not bruise/bleed easily.    Psychiatric/Behavioral: Negative for agitation, behavioral problems, confusion, decreased concentration, dysphoric mood, hallucinations, self-injury, sleep disturbance and suicidal ideas. The patient is not nervous/anxious and is not hyperactive.     Past Medical History:  Diagnosis Date  . Diabetes mellitus without complication (HCC)   . Gout   . Hypertension    History reviewed. No pertinent surgical history. No Known Allergies No current outpatient medications on file prior to visit.   No current facility-administered medications on file prior to visit.    Social History   Socioeconomic History  . Marital status: Single    Spouse name: Not on file  . Number of children: 0  . Years of education: Not on file  . Highest education level: Not on file  Occupational History  . Occupation: MISSIONARY    Employer: [employer]  Social Needs  . Financial resource strain: Not on file  . Food insecurity:    Worry: Not on file    Inability: Not on file  . Transportation needs:    Medical: Not on file    Non-medical: Not on file  Tobacco Use  . Smoking status: Never Smoker  . Smokeless tobacco: Never Used  Substance and Sexual Activity  . Alcohol use: Yes  . Drug use: No  . Sexual activity: Not Currently    Birth control/protection: Post-menopausal  Lifestyle  . Physical activity:    Days per week: Not on file    Minutes per session: Not on file  . Stress: Not on file  Relationships  . Social connections:    Talks on phone: Not on file    Gets together: Not on file    Attends religious service: Not on file    Active member of club or organization: Not on file    Attends meetings of clubs or organizations: Not on file    Relationship status: Not on file  . Intimate partner violence:    Fear of current or ex partner: Not on file    Emotionally abused: Not on file    Physically abused: Not on file    Forced sexual activity: Not on file  Other Topics Concern   . Not on file  Social History Narrative   Marital status:  Single; dating loves to date!      Children: none       Lives: alone in huge farm house      Employment: [employer]; full time; missionary work.  Speaking engagements.      Tobacco: none      Alcohol: socially      Drugs: none      Exercise:  Three days per week; exercise table/cardio     Seatbelt:  None in 2018   Family History  Problem Relation Age of Onset  . Alcohol abuse Father        Objective:    BP 128/72   Pulse 98   Temp 98.5 F (36.9 C) (Oral)   Resp 16   Ht 6' (1.829 m)   Wt (!) 380 lb (172.4 kg)   SpO2 98%   BMI 51.54 kg/m  Physical Exam  Constitutional: She is oriented to person, place, and time. She appears well-developed and well-nourished. No distress.  HENT:  Head: Normocephalic and atraumatic.  Right Ear: External ear normal.  Left Ear: External ear normal.  Nose: Nose normal.  Mouth/Throat: Oropharynx is clear and moist.  Eyes: Pupils are equal, round, and reactive  to light. Conjunctivae and EOM are normal.  Neck: Normal range of motion and full passive range of motion without pain. Neck supple. No JVD present. Carotid bruit is not present. No thyromegaly present.  Cardiovascular: Normal rate, regular rhythm and normal heart sounds. Exam reveals no gallop and no friction rub.  No murmur heard. Pulmonary/Chest: Effort normal and breath sounds normal. She has no wheezes. She has no rales.  Abdominal: Soft. Bowel sounds are normal. She exhibits no distension and no mass. There is no tenderness. There is no rebound and no guarding.  Musculoskeletal:       Right shoulder: Normal.       Left shoulder: Normal.       Right ankle: She exhibits normal range of motion and no swelling. Tenderness. Lateral malleolus tenderness found. Achilles tendon normal.       Left ankle: Normal.       Cervical back: Normal.       Right foot: Normal. There is normal range of motion, no tenderness, no bony tenderness  and no swelling.       Left foot: Normal. There is normal range of motion, no tenderness, no bony tenderness and no swelling.  Lymphadenopathy:    She has no cervical adenopathy.  Neurological: She is alert and oriented to person, place, and time. She has normal reflexes. No cranial nerve deficit. She exhibits normal muscle tone. Coordination normal.  Skin: Skin is warm and dry. No rash noted. She is not diaphoretic. No erythema. No pallor.  Psychiatric: She has a normal mood and affect. Her behavior is normal. Judgment and thought content normal.  Nursing note and vitals reviewed.  No results found. Depression screen [facility] 2/9 03/20/2018 01/12/2018 01/02/2018 11/24/2017 07/08/2017  Decreased Interest 0 0 0 0 0  Down, Depressed, Hopeless 0 0 0 0 0  PHQ - 2 Score 0 0 0 0 0   Fall Risk  03/20/2018 01/12/2018 01/02/2018 07/08/2017 11/15/2016  Falls in the past year? No No No No No    Results for orders placed or performed in visit on 03/20/18  POCT glucose (manual entry)  Result Value Ref Range   POC Glucose 103 (A) 70 - 99 mg/dl  POCT glycosylated hemoglobin (Hb A1C)  Result Value Ref Range   Hemoglobin A1C 6.5 (A) 4.0 - 5.6 %   HbA1c POC (<> result, manual entry)  4.0 - 5.6 %   HbA1c, POC (prediabetic range)  5.7 - 6.4 %   HbA1c, POC (controlled diabetic range)  0.0 - 7.0 %       Assessment & Plan:   1. Type 2 diabetes mellitus without complication, without long-term current use of insulin (HCC)   2. 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 Essential hypertension   4. Morbid obesity (HCC)   5. Fever blister   6. Travel advice encounter     DMII: improving glycemic control; congratulations on weight loss and dietary modification.    HTN: well controlled; no changes to therapy; refills provided.  Gout: uncontrolled; increase Allopurinol to 300mg  daily; recent Uric acid of 9.0.  Refill of colchicine provided.  Tramadol and Ibuprofen provided.  HSV  labialis: stable; refill provided.  Travel advise: upcoming trip to [place].  Patient reports immunizations are up-to-date.  Agreeable to Macrobid therapy for potential UTI.   Morbid obesity: congrats on weight loss.  Recommend weight loss, exercise for 30-60 minutes five days per week; recommend 1500 kcal restriction per day  with a minimum of 60 grams of protein per day.  Eat 3 meals per day. Do not skip meals. Consider having a protein shake as a meal replacement to aid with eliminating meal skipping. Look for products with <220 calories, <7 gm sugar, and 20-30 gm protein.  Eat breakfast within 2 hours of getting up.   Make  your plate non-starchy vegetables,  protein, and  carbohydrates at lunch and dinner.   Aim for at least 64 oz. of calorie-free beverages daily (water, Crystal Light, diet green tea, etc.). Eliminate any sugary beverages such as regular soda, sweet tea, or fruit juice.   Pay attention to hunger and fullness cues.  Stop eating once you feel satisfied; don't wait until you feel full, stuffed, or sick from eating.  Choose lean meats and low fat/fat free dairy products.  Choose foods high in fiber such as fruits, vegetables, and whole grains (brown rice, whole wheat pasta, whole wheat bread, etc.).  Limit foods with added sugar to <7 gm per serving.  Always eat in the kitchen/dining room.  Never eat in the bedroom or in front of the TV.     Orders Placed This Encounter  Procedures  . Urinalysis, dipstick only  . POCT glucose (manual entry)  . POCT glycosylated hemoglobin (Hb A1C)   Meds ordered this encounter  Medications  . allopurinol (ZYLOPRIM) 300 MG tablet    Sig: Take 1 tablet (300 mg total) by mouth daily.    Dispense:  90 tablet    Refill:  3  . colchicine 0.6 MG tablet    Sig: Take 1 tablet (0.6 mg total) by mouth 2 (two) times daily as needed.    Dispense:  12 tablet    Refill:  5  . valACYclovir (VALTREX) 1000 MG tablet    Sig: 2 po at  outbreak onset then repeat in 12h for each outbreak    Dispense:  4 tablet    Refill:  12  . ibuprofen (ADVIL,MOTRIN) 800 MG tablet    Sig: Take 1 tablet (800 mg total) by mouth every 8 (eight) hours as needed.    Dispense:  30 tablet    Refill:  0  . hydrochlorothiazide (HYDRODIURIL) 12.5 MG tablet    Sig: Take 1 tablet (12.5 mg total) by mouth daily.    Dispense:  90 tablet    Refill:  3  . amLODipine (NORVASC) 10 MG tablet    Sig: Take 1 tablet (10 mg total) by mouth daily.    Dispense:  90 tablet    Refill:  3  . traMADol (ULTRAM) 50 MG tablet    Sig: Take 1 tablet (50 mg total) by mouth every 8 (eight) hours as needed.    Dispense:  30 tablet    Refill:  0  . nitrofurantoin, macrocrystal-monohydrate, (MACROBID) 100 MG capsule    Sig: Take 1 capsule (100 mg total) by mouth 2 (two) times daily.    Dispense:  14 capsule    Refill:  1    Return in about 6 months (around 09/20/2018) for follow-up chronic medical conditions STALLINGS.   Ortha Metts Paulita Fujita, M.D. Primary Care at [facility] previously [facility] [address] [place], [place]  [address] [phone] phone [phone] fax

## 2018-03-20 NOTE — Patient Instructions (Addendum)
IF you received an x-ray today, you will receive an invoice from [facility]. Please contact [facility] at [phone] with questions or concerns regarding your invoice.   IF you received labwork today, you will receive an invoice from [facility]. Please contact [facility] at [phone] with questions or concerns regarding your invoice.   Our billing staff will not be able to assist you with questions regarding bills from these companies.  You will be contacted with the lab results as soon as they are available. The fastest way to get your results is to activate your My Chart account. Instructions are located on the last page of this paperwork. If you have not heard from [place] regarding the results in 2 weeks, please contact this office.      Preventive Care 40-64 Years, Female Preventive care refers to lifestyle choices and visits with your health care provider that can promote health and wellness. What does preventive care include?  A yearly physical exam. This is also called an annual well check.  Dental exams once or twice a year.  Routine eye exams. Ask your health care provider how often you should have your eyes checked.  Personal lifestyle choices, including: ? Daily care of your teeth and gums. ? Regular physical activity. ? Eating a healthy diet. ? Avoiding tobacco and drug use. ? Limiting alcohol use. ? Practicing safe sex. ? Taking low-dose aspirin daily starting at age 34. ? Taking vitamin and mineral supplements as recommended by your health care provider. What happens during an annual well check? The services and screenings done by your health care provider during your annual well check will depend on your age, overall health, lifestyle risk factors, and family history of disease. Counseling Your health care provider may ask you questions about your:  Alcohol use.  Tobacco use.  Drug use.  Emotional well-being.  Home and relationship  well-being.  Sexual activity.  Eating habits.  Work and work Statistician.  Method of birth control.  Menstrual cycle.  Pregnancy history.  Screening You may have the following tests or measurements:  Height, weight, and BMI.  Blood pressure.  Lipid and cholesterol levels. These may be checked every 5 years, or more frequently if you are over 65 years old.  Skin check.  Lung cancer screening. You may have this screening every year starting at age 102 if you have a 30-pack-year history of smoking and currently smoke or have quit within the past 15 years.  Fecal occult blood test (FOBT) of the stool. You may have this test every year starting at age 33.  Flexible sigmoidoscopy or colonoscopy. You may have a sigmoidoscopy every 5 years or a colonoscopy every 10 years starting at age 23.  Hepatitis C blood test.  Hepatitis B blood test.  Sexually transmitted disease (STD) testing.  Diabetes screening. This is done by checking your blood sugar (glucose) after you have not eaten for a while (fasting). You may have this done every 1-3 years.  Mammogram. This may be done every 1-2 years. Talk to your health care provider about when you should start having regular mammograms. This may depend on whether you have a family history of breast cancer.  BRCA-related cancer screening. This may be done if you have a family history of breast, ovarian, tubal, or peritoneal cancers.  Pelvic exam and Pap test. This may be done every 3 years starting at age 3. Starting at age 55, this may be done every 5 years if  you have a Pap test in combination with an HPV test.  Bone density scan. This is done to screen for osteoporosis. You may have this scan if you are at high risk for osteoporosis.  Discuss your test results, treatment options, and if necessary, the need for more tests with your health care provider. Vaccines Your health care provider may recommend certain vaccines, such  as:  Influenza vaccine. This is recommended every year.  Tetanus, diphtheria, and acellular pertussis (Tdap, Td) vaccine. You may need a Td booster every 10 years.  Varicella vaccine. You may need this if you have not been vaccinated.  Zoster vaccine. You may need this after age 77.  Measles, mumps, and rubella (MMR) vaccine. You may need at least one dose of MMR if you were born in 1957 or later. You may also need a second dose.  Pneumococcal 13-valent conjugate (PCV13) vaccine. You may need this if you have certain conditions and were not previously vaccinated.  Pneumococcal polysaccharide (PPSV23) vaccine. You may need one or two doses if you smoke cigarettes or if you have certain conditions.  Meningococcal vaccine. You may need this if you have certain conditions.  Hepatitis A vaccine. You may need this if you have certain conditions or if you travel or work in places where you may be exposed to hepatitis A.  Hepatitis B vaccine. You may need this if you have certain conditions or if you travel or work in places where you may be exposed to hepatitis B.  Haemophilus influenzae type b (Hib) vaccine. You may need this if you have certain conditions.  Talk to your health care provider about which screenings and vaccines you need and how often you need them. This information is not intended to replace advice given to you by your health care provider. Make sure you discuss any questions you have with your health care provider. Document Released: 09/19/2015 Document Revised: 05/12/2016 Document Reviewed: 06/24/2015 Elsevier Interactive Patient Education  [employer].

## 2018-03-21 ENCOUNTER — Encounter: Payer: Self-pay | Admitting: Family Medicine

## 2018-12-23 ENCOUNTER — Telehealth: Payer: Self-pay | Admitting: Physician Assistant

## 2018-12-23 DIAGNOSIS — R300 Dysuria: Secondary | ICD-10-CM

## 2018-12-23 MED ORDER — CEPHALEXIN 500 MG PO CAPS: 500.0000 mg | ORAL_CAPSULE | Freq: Two times a day (BID) | ORAL | 0 refills | Status: AC

## 2018-12-23 NOTE — Progress Notes (Signed)
Message sent to patient asking her to fill out e-visit questionnaire for UTI.

## 2018-12-23 NOTE — Progress Notes (Signed)

## 2018-12-23 NOTE — Progress Notes (Signed)
I have spent 5 minutes in review of e-visit questionnaire, review and updating patient chart, medical decision making and response to patient.   Siddh Vandeventer Cody Toshiye Kever, PA-C    

## 2019-02-12 ENCOUNTER — Other Ambulatory Visit: Payer: Self-pay | Admitting: Obstetrics and Gynecology

## 2019-02-12 DIAGNOSIS — Z1231 Encounter for screening mammogram for malignant neoplasm of breast: Secondary | ICD-10-CM

## 2019-03-28 ENCOUNTER — Other Ambulatory Visit: Payer: Self-pay

## 2019-03-28 ENCOUNTER — Ambulatory Visit
Admission: RE | Admit: 2019-03-28 | Discharge: 2019-03-28 | Disposition: A | Discharge status: Home / Self Care | Payer: No Typology Code available for payment source | Source: Ambulatory Visit | Attending: Obstetrics and Gynecology | Admitting: Obstetrics and Gynecology

## 2019-03-28 DIAGNOSIS — Z1231 Encounter for screening mammogram for malignant neoplasm of breast: Secondary | ICD-10-CM

## 2019-03-28 LAB — HM MAMMOGRAPHY

## 2019-03-29 LAB — HM MAMMOGRAPHY

## 2019-03-30 ENCOUNTER — Other Ambulatory Visit: Payer: Self-pay

## 2019-03-30 ENCOUNTER — Ambulatory Visit (INDEPENDENT_AMBULATORY_CARE_PROVIDER_SITE_OTHER): Payer: Self-pay | Admitting: Family Medicine

## 2019-03-30 ENCOUNTER — Encounter: Payer: Self-pay | Admitting: Family Medicine

## 2019-03-30 VITALS — BP 138/78 | HR 83 | Temp 98.4°F | Resp 18 | Ht 72.0 in | Wt >= 6400 oz

## 2019-03-30 DIAGNOSIS — B001 Herpesviral vesicular dermatitis: Secondary | ICD-10-CM

## 2019-03-30 DIAGNOSIS — E119 Type 2 diabetes mellitus without complications: Secondary | ICD-10-CM

## 2019-03-30 DIAGNOSIS — I10 Essential (primary) hypertension: Secondary | ICD-10-CM

## 2019-03-30 DIAGNOSIS — M1A0720 Idiopathic chronic gout, left ankle and foot, without tophus (tophi): Secondary | ICD-10-CM

## 2019-03-30 MED ORDER — AMLODIPINE BESYLATE 10 MG PO TABS: 10.0000 mg | ORAL_TABLET | Freq: Every day | ORAL | 3 refills | Status: DC

## 2019-03-30 MED ORDER — ALLOPURINOL 300 MG PO TABS: 300.0000 mg | ORAL_TABLET | Freq: Every day | ORAL | 3 refills | Status: DC

## 2019-03-30 MED ORDER — COLCHICINE 0.6 MG PO TABS: 0.6000 mg | ORAL_TABLET | Freq: Two times a day (BID) | ORAL | 5 refills | Status: DC | PRN

## 2019-03-30 MED ORDER — VALACYCLOVIR HCL 1 G PO TABS: ORAL_TABLET | ORAL | 3 refills | Status: DC

## 2019-03-30 MED ORDER — HYDROCHLOROTHIAZIDE 12.5 MG PO TABS: 12.5000 mg | ORAL_TABLET | Freq: Every day | ORAL | 3 refills | Status: DC

## 2019-03-30 NOTE — Patient Instructions (Signed)

## 2019-03-30 NOTE — Progress Notes (Signed)
7/24/20203:05 PM  Mckenzie [name] [birth year], 63 y.o., female [id number]  Chief Complaint  Patient presents with  . Medication Management  . Medication Refill    pt will need paper Rx, she needs allopurinol, amlodipine,valtrex and hydrochl    HPI:   Patient is a 63 y.o. female with past medical history significant for HTN, DM2, gout and fever blisters who presents today for medication refill  Last OV July 2019 with Dr Tamala Julian Increased allopurinol to 300mg  Gout really well controlled She does not check her cbgs nor BP She has been more sedentary since [employer] of her church Wears eyeglasses, last exam was this year, [facility], in [employer] Has no acute concerns today  Lab Results  Component Value Date   HGBA1C 6.5 (A) 03/20/2018   Lab Results  Component Value Date   CREATININE 0.84 01/02/2018   BUN 12 01/02/2018   NA 144 01/02/2018   K 3.8 01/02/2018   CL 101 01/02/2018   CO2 29 01/02/2018   Lab Results  Component Value Date   CHOL 224 (H) 07/08/2017   HDL 78 07/08/2017   LDLCALC 129 (H) 07/08/2017   TRIG 86 07/08/2017   CHOLHDL 2.9 07/08/2017   Lab Results  Component Value Date   LABURIC 9.0 (H) 01/02/2018    Depression screen PHQ 2/9 03/30/2019 03/20/2018 01/12/2018  Decreased Interest 0 0 0  Down, Depressed, Hopeless 0 0 0  PHQ - 2 Score 0 0 0    Fall Risk  03/20/2018 01/12/2018 01/02/2018 07/08/2017 11/15/2016  Falls in the past year? No No No No No     No Known Allergies  Prior to Admission medications   Medication Sig Start Date End Date Taking? Authorizing Provider  allopurinol (ZYLOPRIM) 300 MG tablet Take 1 tablet (300 mg total) by mouth daily. 03/20/18  Yes Wardell Honour, MD  amLODipine (NORVASC) 10 MG tablet Take 1 tablet (10 mg total) by mouth daily. 03/20/18  Yes Wardell Honour, MD  colchicine 0.6 MG tablet Take 1 tablet (0.6 mg total) by mouth 2 (two) times daily as needed. 03/20/18  Yes Wardell Honour, MD  hydrochlorothiazide  (HYDRODIURIL) 12.5 MG tablet Take 1 tablet (12.5 mg total) by mouth daily. 03/20/18  Yes Wardell Honour, MD  ibuprofen (ADVIL,MOTRIN) 800 MG tablet Take 1 tablet (800 mg total) by mouth every 8 (eight) hours as needed. 03/20/18  Yes Wardell Honour, MD  valACYclovir (VALTREX) 1000 MG tablet 2 po at outbreak onset then repeat in 12h for each outbreak 03/20/18  Yes Wardell Honour, MD    Past Medical History:  Diagnosis Date  . Diabetes mellitus without complication ([place])   . Gout   . Hypertension     History reviewed. No pertinent surgical history.  Social History   Tobacco Use  . Smoking status: Never Smoker  . Smokeless tobacco: Never Used  Substance Use Topics  . Alcohol use: Yes    Family History  Problem Relation Age of Onset  . Alcohol abuse Father     Review of Systems  Constitutional: Negative for chills and fever.  Respiratory: Negative for cough and shortness of breath.   Cardiovascular: Negative for chest pain, palpitations and leg swelling.  Gastrointestinal: Negative for abdominal pain, nausea and vomiting.     OBJECTIVE:  Today's Vitals   03/30/19 1450  BP: 138/78  Pulse: 83  Resp: 18  Temp: 98.4 F (36.9 C)  TempSrc: Oral  SpO2: 94%  Weight: Marland Kitchen)  416 lb (188.7 kg)  Height: 6' (1.829 m)   Body mass index is 56.42 kg/m.  Wt Readings from Last 3 Encounters:  03/30/19 (!) 416 lb (188.7 kg)  03/20/18 (!) 380 lb (172.4 kg)  01/12/18 (!) 420 lb (190.5 kg)    Physical Exam Vitals signs and nursing note reviewed.  Constitutional:      Appearance: She is well-developed.  HENT:     Head: Normocephalic and atraumatic.     Mouth/Throat:     Pharynx: No oropharyngeal exudate.  Eyes:     General: No scleral icterus.    Conjunctiva/sclera: Conjunctivae normal.     Pupils: Pupils are equal, round, and reactive to light.  Neck:     Musculoskeletal: Neck supple.  Cardiovascular:     Rate and Rhythm: Normal rate and regular rhythm.     Heart sounds:  Normal heart sounds. No murmur. No friction rub. No gallop.   Pulmonary:     Effort: Pulmonary effort is normal.     Breath sounds: Normal breath sounds. No wheezing or rales.  Skin:    General: Skin is warm and dry.  Neurological:     Mental Status: She is alert and oriented to person, place, and time.    Diabetic Foot Exam - Simple   Simple Foot Form Diabetic Foot exam was performed with the following findings: Yes 03/30/2019  3:40 PM  Visual Inspection No deformities, no ulcerations, no other skin breakdown bilaterally: Yes Sensation Testing Intact to touch and monofilament testing bilaterally: Yes Pulse Check Posterior Tibialis and Dorsalis pulse intact bilaterally: Yes Comments     ASSESSMENT and PLAN  Type 2 diabetes mellitus without complication, without long-term current use of insulin (HCC) - Plan: Hemoglobin A1c, TSH, Microalbumin/Creatinine Ratio, Urine, HM DIABETES FOOT EXAM,   Essential hypertension - Plan: Comprehensive metabolic panel, Lipid panel, hydrochlorothiazide (HYDRODIURIL) 12.5 MG tablet, amLODipine (NORVASC) 10 MG tablet,  Idiopathic chronic gout of left foot without tophus - Plan: Uric Acid,  Fever blister - Plan: valACYclovir (VALTREX) 1000 MG tablet,  Chronic medical conditions are stable/controlled. Will continue current regime. Meds refilled. Discussed LFM and weight loss. ROI requested for ob gyn, gi and ophtho. Prefers to be seen once a year due to uninsured status.   Return in about 1 year (around 03/29/2020).    Myles LippsIrma M Santiago, MD Primary Care at [facility] [address] [place], [place] [address] Ph.  [phone] Fax [phone]

## 2019-03-31 LAB — COMPREHENSIVE METABOLIC PANEL
ALT: 23 IU/L (ref 0–32)
AST: 20 IU/L (ref 0–40)
Albumin/Globulin Ratio: 1.1 — ABNORMAL LOW (ref 1.2–2.2)
Albumin: 3.9 g/dL (ref 3.8–4.8)
Alkaline Phosphatase: 84 IU/L (ref 39–117)
BUN/Creatinine Ratio: 21 (ref 12–28)
BUN: 18 mg/dL (ref 8–27)
Bilirubin Total: 0.5 mg/dL (ref 0.0–1.2)
CO2: 28 mmol/L (ref 20–29)
Calcium: 9.6 mg/dL (ref 8.7–10.3)
Chloride: 101 mmol/L (ref 96–106)
Creatinine, Ser: 0.85 mg/dL (ref 0.57–1.00)
GFR calc Af Amer: 86 mL/min/{1.73_m2} (ref >59)
GFR calc non Af Amer: 74 mL/min/{1.73_m2} (ref >59)
Globulin, Total: 3.5 g/dL (ref 1.5–4.5)
Glucose: 142 mg/dL — ABNORMAL HIGH (ref 65–99)
Potassium: 4.1 mmol/L (ref 3.5–5.2)
Sodium: 145 mmol/L — ABNORMAL HIGH (ref 134–144)
Total Protein: 7.4 g/dL (ref 6.0–8.5)

## 2019-03-31 LAB — LIPID PANEL
Chol/HDL Ratio: 2.8 ratio (ref 0.0–4.4)
Cholesterol, Total: 215 mg/dL — ABNORMAL HIGH (ref 100–199)
HDL: 78 mg/dL (ref >39)
LDL Calculated: 126 mg/dL — ABNORMAL HIGH (ref 0–99)
Triglycerides: 57 mg/dL (ref 0–149)
VLDL Cholesterol Cal: 11 mg/dL (ref 5–40)

## 2019-03-31 LAB — HEMOGLOBIN A1C
Est. average glucose Bld gHb Est-mCnc: 171 mg/dL
Hgb A1c MFr Bld: 7.6 % — ABNORMAL HIGH (ref 4.8–5.6)

## 2019-03-31 LAB — TSH: TSH: 2.16 u[IU]/mL (ref 0.450–4.500)

## 2019-03-31 LAB — MICROALBUMIN / CREATININE URINE RATIO
Creatinine, Urine: 83.9 mg/dL
Microalb/Creat Ratio: 78 mg/g creat — ABNORMAL HIGH (ref 0–29)
Microalbumin, Urine: 65.2 ug/mL

## 2019-03-31 LAB — URIC ACID: Uric Acid: 6.1 mg/dL (ref 2.5–7.1)

## 2019-04-05 ENCOUNTER — Encounter: Payer: Self-pay | Admitting: Family Medicine

## 2019-04-05 ENCOUNTER — Ambulatory Visit (INDEPENDENT_AMBULATORY_CARE_PROVIDER_SITE_OTHER): Payer: Self-pay | Admitting: Family Medicine

## 2019-04-05 ENCOUNTER — Other Ambulatory Visit: Payer: Self-pay

## 2019-04-05 DIAGNOSIS — M654 Radial styloid tenosynovitis [de Quervain]: Secondary | ICD-10-CM

## 2019-04-05 DIAGNOSIS — M25532 Pain in left wrist: Secondary | ICD-10-CM

## 2019-04-05 NOTE — Patient Instructions (Addendum)
Site of blood right elbow looks okay today.  Based on your exam it appears you may have a component of de Quervain's tenosynovitis or inflammation/irritation of the muscle and tendon of the forearm.    Try wearing the brace for the next week to 2 weeks at the most with range of motion out of the brace 1 or 2 times per day as symptoms improve.    If not improving in the next 2 weeks, let me know and I can refer you to a hand specialist.    If any worsening symptoms such as bruising, increased swelling, redness, or other worsening please return for recheck sooner.  Thank you for coming in today.    De Quervain's Tenosynovitis  De Quervain's tenosynovitis is a condition that causes inflammation of the tendon on the thumb side of the wrist. Tendons are cords of tissue that connect bones to muscles. The tendons in the hand pass through a tunnel called a sheath. A slippery layer of tissue (synovium) lets the tendons move smoothly in the sheath. With de Quervain's tenosynovitis, the sheath swells or thickens, causing friction and pain. The condition is also called de Quervain's disease and de Quervain's syndrome. It occurs most often in women who are 34-54 years old. What are the causes? The exact cause of this condition is not known. It may be associated with overuse of the hand and wrist. What increases the risk? You are more likely to develop this condition if you:  Use your hands far more than normal, especially if you repeat certain movements that involve twisting your hand or using a tight grip.  Are pregnant.  Are a middle-aged woman.  Have rheumatoid arthritis.  Have diabetes. What are the signs or symptoms? The main symptom of this condition is pain on the thumb side of the wrist. The pain may get worse when you grasp something or turn your wrist. Other symptoms may include:  Pain that extends up the forearm.  Swelling of your wrist and hand.  Trouble moving the thumb and  wrist.  A sensation of snapping in the wrist.  A bump filled with fluid (cyst) in the area of the pain. How is this diagnosed? This condition may be diagnosed based on:  Your symptoms and medical history.  A physical exam. During the exam, your health care provider may do a simple test Wynn Maudlin test) that involves pulling your thumb and wrist to see if this causes pain. You may also need to have an X-ray. How is this treated? Treatment for this condition may include:  Avoiding any activity that causes pain and swelling.  Taking medicines. Anti-inflammatory medicines and corticosteroid injections may be used to reduce inflammation and relieve pain.  Wearing a splint.  Having surgery. This may be needed if other treatments do not work. Once the pain and swelling has gone down:  Physical therapy. This includes stretching and strengthening exercises.  Occupational therapy. This includes adjusting how you move your wrist. Follow these instructions at home: If you have a splint:  Wear the splint as told by your health care provider. Remove it only as told by your health care provider.  Loosen the splint if your fingers tingle, become numb, or turn cold and blue.  Keep the splint clean.  If the splint is not waterproof: ? Do not let it get wet. ? Cover it with a watertight covering when you take a bath or a shower. Managing pain, stiffness, and swelling   Avoid  movements and activities that cause pain and swelling in the wrist area.  If directed, put ice on the painful area. This may be helpful after doing activities that involve the sore wrist. ? Put ice in a plastic bag. ? Place a towel between your skin and the bag. ? Leave the ice on for 20 minutes, 2-3 times a day.  Move your fingers often to avoid stiffness and to lessen swelling.  Raise (elevate) the injured area above the level of your heart while you are sitting or lying down. General instructions  Return  to your normal activities as told by your health care provider. Ask your health care provider what activities are safe for you.  Take over-the-counter and prescription medicines only as told by your health care provider.  Keep all follow-up visits as told by your health care provider. This is important. Contact a health care provider if:  Your pain medicine does not help.  Your pain gets worse.  You develop new symptoms. Summary  De Quervain's tenosynovitis is a condition that causes inflammation of the tendon on the thumb side of the wrist.  The condition occurs most often in women who are 230-62 years old.  The exact cause of this condition is not known. It may be associated with overuse of the hand and wrist.  Treatment starts with avoiding activity that causes pain or swelling in the wrist area. Other treatment may include wearing a splint and taking medicine. Sometimes, surgery is needed. This information is not intended to replace advice given to you by your health care provider. Make sure you discuss any questions you have with your health care provider. Document Released: 05/18/2001 Document Revised: 02/23/2018 Document Reviewed: 08/01/2017 Elsevier Patient Education  [employer]2020 Elsevier Inc.     If you have lab work done today you will be contacted with your lab results within the next 2 weeks.  If you have not heard from us then please contact us. The fastest way to get your results is to register for My Chart.   IF you received an x-ray today, you will receive an invoice from [facility]. Please contact [facility] at [phone] with questions or concerns regarding your invoice.   IF you received labwork today, you will receive an invoice from [facility]. Please contact [facility] at [phone] with questions or concerns regarding your invoice.   Our billing staff will not be able to assist you with questions regarding bills from these companies.  You will  be contacted with the lab results as soon as they are available. The fastest way to get your results is to activate your My Chart account. Instructions are located on the last page of this paperwork. If you have not heard from us regarding the results in 2 weeks, please contact this office.

## 2019-04-05 NOTE — Progress Notes (Signed)
Subjective:    Patient ID: Mckenzie [name] Nickless, female    DOB: 1956/01/17, 63 y.o.   MRN: [id number]  HPI Mckenzie [name] Asano is [name] 63 y.o. female Presents today for: Chief Complaint  Patient presents with  . Numbness    Patient have numbness in right arm post blood draw on 03/30/19. Also have some tingling in some spots. Patient refused to do vitals stated she was just here and had them done. Patient stated she have tried ice and heat on area.   Presenting with symptoms above after blood draw at last office visit 6 days ago. Seen July 24 by Dr. Leretha PolSantiago.  History of diabetes, hypertension, gout.  Of note A1c was 7.6 at that time, normal TSH, sodium borderline at 145, glucose 142, remainder of CMP was reassuring.  Had blood draw on 7/24 at left antecubital vein. No difficulty with blood draw, felt different. Felt like something snapped in wrist area (not at area of blood draw). Soreness at base of thumb of wrist, radiates up forearm - like needles - radial side of wrist, at base of thumb.  No rash/blisters.  R hand dominant. No new activities, no known injury. No prior  Wrist issue.  Possibly slight improvement today. Feels better to compress.  Tx: ice, no meds.    Patient Active Problem List   Diagnosis Date Noted  . Type 2 diabetes mellitus without complication, without long-term current use of insulin (HCC) 03/05/2018  . Gout 03/05/2018  . Morbid obesity (HCC) 06/18/2013  . Essential hypertension 03/22/2013   Past Medical History:  Diagnosis Date  . Diabetes mellitus without complication (HCC)   . Gout   . Hypertension    No past surgical history on file. No Known Allergies Prior to Admission medications   Medication Sig Start Date End Date Taking? Authorizing Provider  allopurinol (ZYLOPRIM) 300 MG tablet Take 1 tablet (300 mg total) by mouth daily. 03/30/19  Yes Myles LippsSantiago, Irma M, MD  amLODipine (NORVASC) 10 MG tablet Take 1 tablet (10 mg total) by mouth daily. 03/30/19  Yes  Myles LippsSantiago, Irma M, MD  colchicine 0.6 MG tablet Take 1 tablet (0.6 mg total) by mouth 2 (two) times daily as needed. 03/30/19  Yes Myles LippsSantiago, Irma M, MD  hydrochlorothiazide (HYDRODIURIL) 12.5 MG tablet Take 1 tablet (12.5 mg total) by mouth daily. 03/30/19  Yes Myles LippsSantiago, Irma M, MD  ibuprofen (ADVIL,MOTRIN) 800 MG tablet Take 1 tablet (800 mg total) by mouth every 8 (eight) hours as needed. 03/20/18  Yes Ethelda ChickSmith, Kristi M, MD  valACYclovir (VALTREX) 1000 MG tablet 2 po at outbreak onset then repeat in 12h for each outbreak 03/30/19  Yes Myles LippsSantiago, Irma M, MD   Social History   Socioeconomic History  . Marital status: Single    Spouse name: Not on file  . Number of children: 0  . Years of education: Not on file  . Highest education level: Not on file  Occupational History  . Occupation: [employer]ION[employer]    Employer: [employer]  Social Needs  . Financial resource strain: Not on file  . Food insecurity    Worry: Not on file    Inability: Not on file  . Transportation needs    Medical: Not on file    Non-medical: Not on file  Tobacco Use  . Smoking status: Never Smoker  . Smokeless tobacco: Never Used  Substance and Sexual Activity  . Alcohol use: Yes  . Drug use: No  . Sexual activity: Not  Currently    Birth control/protection: Post-menopausal  Lifestyle  . Physical activity    Days per week: Not on file    Minutes per session: Not on file  . Stress: Not on file  Relationships  . Social Musicianconnections    Talks on phone: Not on file    Gets together: Not on file    Attends religious service: Not on file    Active member of club or organization: Not on file    Attends meetings of clubs or organizations: Not on file    Relationship status: Not on file  . Intimate partner violence    Fear of current or ex partner: Not on file    Emotionally abused: Not on file    Physically abused: Not on file    Forced sexual activity: Not on file  Other Topics Concern  . Not on file   Social History Narrative   Marital status:  Single; dating loves to date!      Children: none       Lives: alone in huge farm house      Employment: [employer]; full time; missionary work.  Speaking engagements.      Tobacco: none      Alcohol: socially      Drugs: none      Exercise:  Three days per week; exercise table/cardio     Seatbelt:  None in 2018    Review of Systems   Per HPI.     Objective:   Physical Exam Constitutional:      General: She is not in acute distress.    Appearance: She is well-developed.  HENT:     Head: Normocephalic and atraumatic.  Cardiovascular:     Rate and Rhythm: Normal rate.  Pulmonary:     Effort: Pulmonary effort is normal.  Musculoskeletal:     Left elbow: Normal. She exhibits normal range of motion, no swelling, no effusion, no deformity and no laceration. No tenderness found.     Left wrist: She exhibits tenderness (apl/epb. + finkelstein,). She exhibits no bony tenderness, no swelling, no crepitus and no deformity.       Arms:  Neurological:     Mental Status: She is alert and oriented to person, place, and time.    There were no vitals filed for this visit. Patient declined vitals for current office visit.      Assessment & Plan:   Mckenzie [name] Truszkowski is [name] 63 y.o. female Tenosynovitis, de Public relations account executiveQuervain - Plan: Thumb spica  Arthralgia of left forearm - Plan: Thumb spica  -Left forearm to wrist discomfort, noted after blood draw 6 days ago.  No sign of bruising, no reports of bruising or swelling at the location of blood draw, less likely cause..  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  -Initial trial of forearm thumb spica splint.  Handout given on de Quervain's.  Range of motion out of splint as improves (with some reported improvement today).  If not improving in the next 2 weeks, consider hand/orthopedic evaluation, RTC precautions if any worsening sooner.  No orders of the defined types were placed  in this encounter.  Patient Instructions   Site of blood right elbow looks okay today.  Based on your exam it appears you may have [name] component of de Quervain's tenosynovitis or inflammation/irritation of the muscle and tendon of the forearm.    Try wearing the brace for the next week to 2 weeks at the  most with range of motion out of the brace 1 or 2 times per day as symptoms improve.    If not improving in the next 2 weeks, let me know and I can refer you to [name] hand specialist.    If any worsening symptoms such as bruising, increased swelling, redness, or other worsening please return for recheck sooner.  Thank you for coming in today.    De Quervain's Tenosynovitis  De Quervain's tenosynovitis is [name] condition that causes inflammation of the tendon on the thumb side of the wrist. Tendons are cords of tissue that connect bones to muscles. The tendons in the hand pass through [name] tunnel called [name] sheath. [name] slippery layer of tissue (synovium) lets the tendons move smoothly in the sheath. With de Quervain's tenosynovitis, the sheath swells or thickens, causing friction and pain. The condition is also called de Quervain's disease and de Quervain's syndrome. It occurs most often in women who are 6830-62 years old. What are the causes? The exact cause of this condition is not known. It may be associated with overuse of the hand and wrist. What increases the risk? You are more likely to develop this condition if you:  Use your hands far more than normal, especially if you repeat certain movements that involve twisting your hand or using [name] tight grip.  Are pregnant.  Are [name] middle-aged woman.  Have rheumatoid arthritis.  Have diabetes. What are the signs or symptoms? The main symptom of this condition is pain on the thumb side of the wrist. The pain may get worse when you grasp something or turn your wrist. Other symptoms may include:  Pain that extends up the forearm.  Swelling of your wrist and  hand.  Trouble moving the thumb and wrist.  [name] sensation of snapping in the wrist.  [name] bump filled with fluid (cyst) in the area of the pain. How is this diagnosed? This condition may be diagnosed based on:  Your symptoms and medical history.  [name] physical exam. During the exam, your health care provider may do [name] simple test Lourena Simmonds(Finkelstein test) that involves pulling your thumb and wrist to see if this causes pain. You may also need to have an X-ray. How is this treated? Treatment for this condition may include:  Avoiding any activity that causes pain and swelling.  Taking medicines. Anti-inflammatory medicines and corticosteroid injections may be used to reduce inflammation and relieve pain.  Wearing [name] splint.  Having surgery. This may be needed if other treatments do not work. Once the pain and swelling has gone down:  Physical therapy. This includes stretching and strengthening exercises.  Occupational therapy. This includes adjusting how you move your wrist. Follow these instructions at home: If you have [name] splint:  Wear the splint as told by your health care provider. Remove it only as told by your health care provider.  Loosen the splint if your fingers tingle, become numb, or turn cold and blue.  Keep the splint clean.  If the splint is not waterproof: ? Do not let it get wet. ? Cover it with [name] watertight covering when you take [name] bath or [name] shower. Managing pain, stiffness, and swelling   Avoid movements and activities that cause pain and swelling in the wrist area.  If directed, put ice on the painful area. This may be helpful after doing activities that involve the sore wrist. ? Put ice in [name] plastic bag. ? Place [name] towel between your skin and the bag. ?  Leave the ice on for 20 minutes, 2-3 times [name] day.  Move your fingers often to avoid stiffness and to lessen swelling.  Raise (elevate) the injured area above the level of your heart while you are sitting or lying  down. General instructions  Return to your normal activities as told by your health care provider. Ask your health care provider what activities are safe for you.  Take over-the-counter and prescription medicines only as told by your health care provider.  Keep all follow-up visits as told by your health care provider. This is important. Contact [name] health care provider if:  Your pain medicine does not help.  Your pain gets worse.  You develop new symptoms. Summary  De Quervain's tenosynovitis is [name] condition that causes inflammation of the tendon on the thumb side of the wrist.  The condition occurs most often in women who are 75-68 years old.  The exact cause of this condition is not known. It may be associated with overuse of the hand and wrist.  Treatment starts with avoiding activity that causes pain or swelling in the wrist area. Other treatment may include wearing [name] splint and taking medicine. Sometimes, surgery is needed. This information is not intended to replace advice given to you by your health care provider. Make sure you discuss any questions you have with your health care provider. Document Released: 05/18/2001 Document Revised: 02/23/2018 Document Reviewed: 08/01/2017 Elsevier Patient Education  [employer].     If you have lab work done today you will be contacted with your lab results within the next 2 weeks.  If you have not heard from [place] then please contact us. The fastest way to get your results is to register for My Chart.   IF you received an x-ray today, you will receive an invoice from [facility]. Please contact [facility] at [phone] with questions or concerns regarding your invoice.   IF you received labwork today, you will receive an invoice from [facility]. Please contact [facility] at [phone] with questions or concerns regarding your invoice.   Our billing staff will not be able to assist you with questions regarding  bills from these companies.  You will be contacted with the lab results as soon as they are available. The fastest way to get your results is to activate your My Chart account. Instructions are located on the last page of this paperwork. If you have not heard from [place] regarding the results in 2 weeks, please contact this office.       Signed,   Merri Ray, MD Primary Care at [facility].  04/05/19 8:34 PM

## 2019-04-09 MED ORDER — LOSARTAN POTASSIUM 25 MG PO TABS: 25.0000 mg | ORAL_TABLET | Freq: Every day | ORAL | 1 refills | Status: DC

## 2019-04-09 MED ORDER — ATORVASTATIN CALCIUM 40 MG PO TABS: 40.0000 mg | ORAL_TABLET | Freq: Every day | ORAL | 3 refills | Status: DC

## 2019-04-09 NOTE — Addendum Note (Signed)
Addended by: Rutherford Guys on: 04/09/2019 08:57 PM   Modules accepted: Orders

## 2019-06-28 ENCOUNTER — Other Ambulatory Visit: Payer: Self-pay

## 2019-06-28 DIAGNOSIS — Z20822 Contact with and (suspected) exposure to covid-19: Secondary | ICD-10-CM

## 2019-06-30 LAB — NOVEL CORONAVIRUS, NAA: SARS-CoV-2, NAA: NOT DETECTED

## 2019-07-19 ENCOUNTER — Telehealth (INDEPENDENT_AMBULATORY_CARE_PROVIDER_SITE_OTHER): Payer: Self-pay | Admitting: Emergency Medicine

## 2019-07-19 ENCOUNTER — Encounter: Payer: Self-pay | Admitting: Emergency Medicine

## 2019-07-19 VITALS — Ht 72.0 in | Wt >= 6400 oz

## 2019-07-19 DIAGNOSIS — L299 Pruritus, unspecified: Secondary | ICD-10-CM

## 2019-07-19 DIAGNOSIS — L03114 Cellulitis of left upper limb: Secondary | ICD-10-CM

## 2019-07-19 MED ORDER — DIPHENHYDRAMINE-ZINC ACETATE 2-0.1 % EX CREA: 1.0000 "application " | TOPICAL_CREAM | Freq: Three times a day (TID) | CUTANEOUS | 0 refills | Status: DC | PRN

## 2019-07-19 MED ORDER — CEFADROXIL 500 MG PO CAPS: 500.0000 mg | ORAL_CAPSULE | Freq: Two times a day (BID) | ORAL | 0 refills | Status: AC

## 2019-07-19 NOTE — Progress Notes (Signed)
Called patient to triage for appointment. Patient complaint is bumps on the left forearms for 3-4 days with warmth, swelling and itching. Patient used triamcinolone acetonide 0.1% cream and it is not helping.

## 2019-07-19 NOTE — Progress Notes (Signed)
Telemedicine Encounter- SOAP NOTE Established Patient Doxy me visit. This video telephone encounter was conducted with the patient's (or proxy's) verbal consent via video audio telecommunications: yes/no: Yes Patient was instructed to have this encounter in a suitably private space; and to only have persons present to whom they give permission to participate. In addition, patient identity was confirmed by use of name plus two identifiers (DOB and address).  I discussed the limitations, risks, security and privacy concerns of performing an evaluation and management service by telephone and the availability of in person appointments. I also discussed with the patient that there may be a patient responsible charge related to this service. The patient expressed understanding and agreed to proceed.  I spent a total of TIME; 0 MIN TO 60 MIN: 15 minutes talking with the patient or their proxy.  No chief complaint on file. Itching to left forearm  Subjective   Mckenzie [name] is a 63 y.o. female established patient. Telephone visit today complaining of possible insect bite to left forearm with subsequent itching.  Now area is warm and red and still itching.  She used cold compresses and triamcinolone cream at home with only minimal relief.  No other significant symptoms.  HPI   Patient Active Problem List   Diagnosis Date Noted  . Type 2 diabetes mellitus without complication, without long-term current use of insulin (HCC) 03/05/2018  . Gout 03/05/2018  . Morbid obesity (HCC) 06/18/2013  . Essential hypertension 03/22/2013    Past Medical History:  Diagnosis Date  . Diabetes mellitus without complication (HCC)   . Gout   . Hypertension     Current Outpatient Medications  Medication Sig Dispense Refill  . allopurinol (ZYLOPRIM) 300 MG tablet Take 1 tablet (300 mg total) by mouth daily. 90 tablet 3  . amLODipine (NORVASC) 10 MG tablet Take 1 tablet (10 mg total) by mouth daily. 90  tablet 3  . atorvastatin (LIPITOR) 40 MG tablet Take 1 tablet (40 mg total) by mouth daily. 90 tablet 3  . colchicine 0.6 MG tablet Take 1 tablet (0.6 mg total) by mouth 2 (two) times daily as needed. 12 tablet 5  . losartan (COZAAR) 25 MG tablet Take 1 tablet (25 mg total) by mouth daily. 90 tablet 1  . valACYclovir (VALTREX) 1000 MG tablet 2 po at outbreak onset then repeat in 12h for each outbreak 4 tablet 3  . cefadroxil (DURICEF) 500 MG capsule Take 1 capsule (500 mg total) by mouth 2 (two) times daily for 7 days. 14 capsule 0  . diphenhydrAMINE-zinc acetate (BENADRYL EXTRA STRENGTH) cream Apply 1 application topically 3 (three) times daily as needed for itching. 28.4 g 0  . ibuprofen (ADVIL,MOTRIN) 800 MG tablet Take 1 tablet (800 mg total) by mouth every 8 (eight) hours as needed. (Patient not taking: Reported on 07/19/2019) 30 tablet 0   No current facility-administered medications for this visit.     No Known Allergies  Social History   Socioeconomic History  . Marital status: Single    Spouse name: Not on file  . Number of children: 0  . Years of education: Not on file  . Highest education level: Not on file  Occupational History  . Occupation: MISSIONARY    Employer: [employer]  Social Needs  . Financial resource strain: Not on file  . Food insecurity    Worry: Not on file    Inability: Not on file  . Transportation needs    Medical:  Not on file    Non-medical: Not on file  Tobacco Use  . Smoking status: Never Smoker  . Smokeless tobacco: Never Used  Substance and Sexual Activity  . Alcohol use: Yes  . Drug use: No  . Sexual activity: Not Currently    Birth control/protection: Post-menopausal  Lifestyle  . Physical activity    Days per week: Not on file    Minutes per session: Not on file  . Stress: Not on file  Relationships  . Social Herbalist on phone: Not on file    Gets together: Not on file    Attends religious service: Not  on file    Active member of club or organization: Not on file    Attends meetings of clubs or organizations: Not on file    Relationship status: Not on file  . Intimate partner violence    Fear of current or ex partner: Not on file    Emotionally abused: Not on file    Physically abused: Not on file    Forced sexual activity: Not on file  Other Topics Concern  . Not on file  Social History Narrative   Marital status:  Single; dating loves to date!      Children: none       Lives: alone in huge farm house      Employment: [name]; full time; [employer] work.  Speaking engagements.      Tobacco: none      Alcohol: socially      Drugs: none      Exercise:  Three days per week; exercise table/cardio     Seatbelt:  None in 2018    Review of Systems  Constitutional: Negative.  Negative for chills and fever.  HENT: Negative.  Negative for congestion and sore throat.   Respiratory: Negative.  Negative for cough and shortness of breath.   Cardiovascular: Negative.  Negative for chest pain and palpitations.  Gastrointestinal: Negative for abdominal pain, diarrhea, nausea and vomiting.  Genitourinary: Negative.  Negative for dysuria and hematuria.  Musculoskeletal: Negative.  Negative for myalgias.  Skin: Positive for itching and rash.  Neurological: Negative.  Negative for dizziness and headaches.  All other systems reviewed and are negative.   Objective  Alert and oriented x3 in no apparent respiratory distress. Vitals as reported by the patient: Today's Vitals   07/19/19 1326  Weight: (!) 400 lb (181.4 kg)  Height: 6' (1.829 m)  Physical Exam Constitutional:      Appearance: Normal appearance.  HENT:     Head: Normocephalic.  Eyes:     Extraocular Movements: Extraocular movements intact.  Neck:     Musculoskeletal: Normal range of motion.  Pulmonary:     Effort: Pulmonary effort is normal.  Musculoskeletal: Normal range of motion.  Skin:    Comments: Left forearm:  Positive erythema with swelling  Neurological:     Mental Status: She is alert and oriented to person, place, and time.  Psychiatric:        Mood and Affect: Mood normal.        Behavior: Behavior normal.     Diagnoses and all orders for this visit:  Cellulitis of forearm, left -     cefadroxil (DURICEF) 500 MG capsule; Take 1 capsule (500 mg total) by mouth 2 (two) times daily for 7 days.  Itching -     diphenhydrAMINE-zinc acetate (BENADRYL EXTRA STRENGTH) cream; Apply 1 application topically 3 (three) times daily as  needed for itching.     I discussed the assessment and treatment plan with the patient. The patient was provided an opportunity to ask questions and all were answered. The patient agreed with the plan and demonstrated an understanding of the instructions.   The patient was advised to call back or seek an in-person evaluation if the symptoms worsen or if the condition fails to improve as anticipated.  I provided 15 minutes of non-face-to-face time during this encounter.  Georgina QuintMiguel Jose Rasmus Preusser, MD  Primary [facility] at [facility]

## 2019-08-29 ENCOUNTER — Ambulatory Visit: Payer: HRSA Program | Attending: Internal Medicine

## 2019-08-29 DIAGNOSIS — Z20822 Contact with and (suspected) exposure to covid-19: Secondary | ICD-10-CM

## 2019-08-29 DIAGNOSIS — Z20828 Contact with and (suspected) exposure to other viral communicable diseases: Secondary | ICD-10-CM | POA: Diagnosis not present

## 2019-08-30 LAB — NOVEL CORONAVIRUS, NAA: SARS-CoV-2, NAA: NOT DETECTED

## 2019-10-01 LAB — EXTERNAL GENERIC LAB PROCEDURE: COLOGUARD: NEGATIVE

## 2019-10-01 LAB — COLOGUARD
COLOGUARD: NEGATIVE
Cologuard: NEGATIVE

## 2019-10-02 ENCOUNTER — Other Ambulatory Visit: Payer: Self-pay | Admitting: Family Medicine

## 2019-10-23 ENCOUNTER — Encounter: Payer: Self-pay | Admitting: Family Medicine

## 2019-10-23 ENCOUNTER — Other Ambulatory Visit: Payer: Self-pay

## 2019-10-23 ENCOUNTER — Ambulatory Visit (INDEPENDENT_AMBULATORY_CARE_PROVIDER_SITE_OTHER): Payer: Self-pay | Admitting: Family Medicine

## 2019-10-23 VITALS — BP 114/64 | HR 85 | Temp 97.9°F | Resp 17 | Ht 72.0 in | Wt >= 6400 oz

## 2019-10-23 DIAGNOSIS — I10 Essential (primary) hypertension: Secondary | ICD-10-CM

## 2019-10-23 DIAGNOSIS — M1A0720 Idiopathic chronic gout, left ankle and foot, without tophus (tophi): Secondary | ICD-10-CM

## 2019-10-23 DIAGNOSIS — E119 Type 2 diabetes mellitus without complications: Secondary | ICD-10-CM

## 2019-10-23 DIAGNOSIS — B001 Herpesviral vesicular dermatitis: Secondary | ICD-10-CM

## 2019-10-23 DIAGNOSIS — E7800 Pure hypercholesterolemia, unspecified: Secondary | ICD-10-CM

## 2019-10-23 MED ORDER — VALACYCLOVIR HCL 1 G PO TABS: ORAL_TABLET | ORAL | 3 refills | Status: DC

## 2019-10-23 MED ORDER — PREDNISONE 20 MG PO TABS: ORAL_TABLET | ORAL | 0 refills | Status: DC

## 2019-10-23 MED ORDER — COLCHICINE 0.6 MG PO TABS: 0.6000 mg | ORAL_TABLET | Freq: Two times a day (BID) | ORAL | 5 refills | Status: DC | PRN

## 2019-10-23 NOTE — Patient Instructions (Signed)
° ° ° °  If you have lab work done today you will be contacted with your lab results within the next 2 weeks.  If you have not heard from us then please contact us. The fastest way to get your results is to register for My Chart. ° ° °IF you received an x-ray today, you will receive an invoice from [facility]. Please contact  Radiology at [phone] with questions or concerns regarding your invoice.  ° °IF you received labwork today, you will receive an invoice from [facility]. Please contact [facility] at [phone] with questions or concerns regarding your invoice.  ° °Our billing staff will not be able to assist you with questions regarding bills from these companies. ° °You will be contacted with the lab results as soon as they are available. The fastest way to get your results is to activate your My Chart account. Instructions are located on the last page of this paperwork. If you have not heard from us regarding the results in 2 weeks, please contact this office. °  ° ° ° °

## 2019-10-23 NOTE — Progress Notes (Signed)
2/16/20211:52 PM  Mckenzie [name] 08-Jan-1956, 64 y.o., female [id number]  Chief Complaint  Patient presents with  . gout flare    left foot received injection on last wednesday and is the only reason she is able to walk.  Pt needs to speak with provider about  ? med changes.  Per pt gout is worsening.    HPI:   Patient is a 64 y.o. female with past medical history significant for HTN, DM2, gout and fever blisters who presents today for gout  Last OV July 2020 - changed HCTZ to ARB given microalbuminuria, started atorvastatin, tolerating meds well  Started with gout flare up over a week ago Previous flare up was over 2 years ago Cant do NSAIDs - due to bleeding Tried colchicine which was not helping so went to podiatry a week ago and received intraarticular steroid injection into left 1st MTP Pain improved but not resolved, area still swollen, tender, red Wondering about increasing allopurinol  Lab Results  Component Value Date   HGBA1C 7.6 (H) 03/30/2019   HGBA1C 6.5 (A) 03/20/2018   HGBA1C 7.7 01/02/2018   Lab Results  Component Value Date   LDLCALC 126 (H) 03/30/2019   CREATININE 0.85 03/30/2019   Lab Results  Component Value Date   LABURIC 6.1 03/30/2019    Depression screen PHQ 2/9 10/23/2019 07/19/2019 04/05/2019  Decreased Interest 0 0 0  Down, Depressed, Hopeless 0 0 0  PHQ - 2 Score 0 0 0    Fall Risk  10/23/2019 07/19/2019 04/05/2019 03/20/2018 01/12/2018  Falls in the past year? 0 0 0 No No  Number falls in past yr: 0 - 0 - -  Injury with Fall? 0 - 0 - -  Follow up Falls evaluation completed Falls evaluation completed Falls evaluation completed - -     No Known Allergies  Prior to Admission medications   Medication Sig Start Date End Date Taking? Authorizing Provider  allopurinol (ZYLOPRIM) 300 MG tablet Take 1 tablet (300 mg total) by mouth daily. 03/30/19  Yes Rutherford Guys, MD  amLODipine (NORVASC) 10 MG tablet Take 1 tablet (10 mg total) by mouth  daily. 03/30/19  Yes Rutherford Guys, MD  atorvastatin (LIPITOR) 40 MG tablet Take 1 tablet (40 mg total) by mouth daily. 04/09/19  Yes Rutherford Guys, MD  colchicine 0.6 MG tablet Take 1 tablet (0.6 mg total) by mouth 2 (two) times daily as needed. 03/30/19  Yes Rutherford Guys, MD  diphenhydrAMINE-zinc acetate (BENADRYL EXTRA STRENGTH) cream Apply 1 application topically 3 (three) times daily as needed for itching. 07/19/19  Yes Sagardia, Ines Bloomer, MD  ibuprofen (ADVIL,MOTRIN) 800 MG tablet Take 1 tablet (800 mg total) by mouth every 8 (eight) hours as needed. 03/20/18  Yes Wardell Honour, MD  losartan (COZAAR) 25 MG tablet TAKE 1 TABLET BY MOUTH EVERY DAY 10/02/19  Yes Rutherford Guys, MD  valACYclovir (VALTREX) 1000 MG tablet 2 po at outbreak onset then repeat in 12h for each outbreak 03/30/19  Yes Rutherford Guys, MD    Past Medical History:  Diagnosis Date  . Diabetes mellitus without complication ([place])   . Gout   . Hypertension     No past surgical history on file.  Social History   Tobacco Use  . Smoking status: Never Smoker  . Smokeless tobacco: Never Used  Substance Use Topics  . Alcohol use: Yes    Family History  Problem Relation Age of Onset  .  Alcohol abuse Father     Review of Systems  Constitutional: Negative for chills and fever.  Respiratory: Negative for cough and shortness of breath.   Cardiovascular: Negative for chest pain, palpitations and leg swelling.  Gastrointestinal: Negative for abdominal pain, nausea and vomiting.  per hpi   OBJECTIVE:  Today's Vitals   10/23/19 1343 10/23/19 1436  BP: (!) 150/93 114/64  Pulse: 85   Resp: 17   Temp: 97.9 F (36.6 C)   TempSrc: Oral   SpO2: 96%   Weight: (!) 417 lb (189.1 kg)   Height: 6' (1.829 m)    Body mass index is 56.56 kg/m.  Wt Readings from Last 3 Encounters:  10/23/19 (!) 417 lb (189.1 kg)  07/19/19 (!) 400 lb (181.4 kg)  03/30/19 (!) 416 lb (188.7 kg)    Physical Exam Vitals  and nursing note reviewed.  Constitutional:      Appearance: She is well-developed.  HENT:     Head: Normocephalic and atraumatic.     Mouth/Throat:     Pharynx: No oropharyngeal exudate.  Eyes:     General: No scleral icterus.    Conjunctiva/sclera: Conjunctivae normal.     Pupils: Pupils are equal, round, and reactive to light.  Cardiovascular:     Rate and Rhythm: Normal rate and regular rhythm.     Heart sounds: Normal heart sounds. No murmur. No friction rub. No gallop.   Pulmonary:     Effort: Pulmonary effort is normal.     Breath sounds: Normal breath sounds. No wheezing or rales.  Musculoskeletal:     Cervical back: Neck supple.       Feet:  Skin:    General: Skin is warm and dry.  Neurological:     Mental Status: She is alert and oriented to person, place, and time.     No results found for this or any previous visit (from the past 24 hour(s)).  No results found.   ASSESSMENT and PLAN  1. Chronic idiopathic gout involving toe of left foot without tophus Oral pred for current flare. Checking labs today, medications will be adjusted as needed. Discussed LFM - Uric Acid  2. Type 2 diabetes mellitus without complication, without long-term current use of insulin (HCC) Checking labs today, medications will be adjusted as needed.  - Lipid panel - Comprehensive metabolic panel - Hemoglobin A1c  3. Essential hypertension Controlled. Continue current regime.  - Lipid panel - Comprehensive metabolic panel - Care order/instruction:  4. Pure hypercholesterolemia Checking labs today, medications will be adjusted as needed.   5. Fever blister - valACYclovir (VALTREX) 1000 MG tablet; 2 po at outbreak onset then repeat in 12h for each outbreak  Other orders - predniSONE (DELTASONE) 20 MG tablet; Take 3 tablets (60 mg total) by mouth daily with breakfast for 3 days, THEN 2 tablets (40 mg total) daily with breakfast for 3 days, THEN 1 tablet (20 mg total) daily with  breakfast for 3 days, THEN 0.5 tablets (10 mg total) daily with breakfast for 4 days. - colchicine 0.6 MG tablet; Take 1 tablet (0.6 mg total) by mouth 2 (two) times daily as needed.  Return if symptoms worsen or fail to improve.    Myles Lipps, MD Primary Care at [facility] [address] [place], [place] [address] Ph.  [phone] Fax [phone]

## 2019-10-24 LAB — COMPREHENSIVE METABOLIC PANEL
ALT: 34 IU/L — ABNORMAL HIGH (ref 0–32)
AST: 23 IU/L (ref 0–40)
Albumin/Globulin Ratio: 1.3 (ref 1.2–2.2)
Albumin: 4.3 g/dL (ref 3.8–4.8)
Alkaline Phosphatase: 101 IU/L (ref 39–117)
BUN/Creatinine Ratio: 16 (ref 12–28)
BUN: 13 mg/dL (ref 8–27)
Bilirubin Total: 0.6 mg/dL (ref 0.0–1.2)
CO2: 28 mmol/L (ref 20–29)
Calcium: 9.9 mg/dL (ref 8.7–10.3)
Chloride: 101 mmol/L (ref 96–106)
Creatinine, Ser: 0.82 mg/dL (ref 0.57–1.00)
GFR calc Af Amer: 89 mL/min/{1.73_m2} (ref >59)
GFR calc non Af Amer: 77 mL/min/{1.73_m2} (ref >59)
Globulin, Total: 3.3 g/dL (ref 1.5–4.5)
Glucose: 133 mg/dL — ABNORMAL HIGH (ref 65–99)
Potassium: 4.2 mmol/L (ref 3.5–5.2)
Sodium: 142 mmol/L (ref 134–144)
Total Protein: 7.6 g/dL (ref 6.0–8.5)

## 2019-10-24 LAB — LIPID PANEL
Chol/HDL Ratio: 2.2 ratio (ref 0.0–4.4)
Cholesterol, Total: 169 mg/dL (ref 100–199)
HDL: 78 mg/dL (ref >39)
LDL Chol Calc (NIH): 80 mg/dL (ref 0–99)
Triglycerides: 54 mg/dL (ref 0–149)
VLDL Cholesterol Cal: 11 mg/dL (ref 5–40)

## 2019-10-24 LAB — HEMOGLOBIN A1C
Est. average glucose Bld gHb Est-mCnc: 174 mg/dL
Hgb A1c MFr Bld: 7.7 % — ABNORMAL HIGH (ref 4.8–5.6)

## 2019-10-24 LAB — URIC ACID: Uric Acid: 4.7 mg/dL (ref 3.0–7.2)

## 2019-11-13 MED ORDER — METFORMIN HCL 500 MG PO TABS: 500.0000 mg | ORAL_TABLET | Freq: Two times a day (BID) | ORAL | 3 refills | Status: DC

## 2019-11-13 NOTE — Addendum Note (Signed)
Addended by: Myles Lipps on: 11/13/2019 01:36 PM   Modules accepted: Orders

## 2019-11-13 NOTE — Progress Notes (Signed)
Pt is asking is there an alternative to the metformin due to th diarrhea that med causes

## 2019-12-29 LAB — HM DIABETES EYE EXAM

## 2020-01-22 ENCOUNTER — Telehealth: Payer: Self-pay | Admitting: General Practice

## 2020-01-22 NOTE — Telephone Encounter (Signed)
Please inform patient that diarrhea and GI upset with colchicine is common. Unfortunately there is not topical version of this medication.  thanks

## 2020-01-22 NOTE — Telephone Encounter (Signed)
She stated that is called colchin gel if you are familiar with it.

## 2020-01-22 NOTE — Telephone Encounter (Signed)
Patient is wanting to know the status on Colsigel  colchicinum 4X gel . this is the med she is requesting  Pt has cant leave the house because of side effect she is having on current medicine

## 2020-01-22 NOTE — Telephone Encounter (Signed)
Spoke with pt and she stated that the colchicine for her gout tore her stomach up and she would like to try a topical gel of that medication.  Please advise.

## 2020-01-22 NOTE — Telephone Encounter (Signed)
This is the medication(gel) that she is requesting.   Please Advise.

## 2020-01-22 NOTE — Telephone Encounter (Signed)
Office received a fax stating that pt is wanting a call from a nurse because pt is having an reaction to the medication and she would like to switch medications. [phone] Please advise.

## 2020-01-23 MED ORDER — COLCIGEL EX GEL: 2.0000 | Freq: Four times a day (QID) | CUTANEOUS | 4 refills | Status: DC | PRN

## 2020-01-23 NOTE — Telephone Encounter (Signed)
I sent in a prescription to [facility] on file. If for some reason she needs it sent to another pharmacy, please do so. Thank you

## 2020-02-05 ENCOUNTER — Other Ambulatory Visit: Payer: Self-pay | Admitting: Obstetrics and Gynecology

## 2020-02-05 DIAGNOSIS — Z1231 Encounter for screening mammogram for malignant neoplasm of breast: Secondary | ICD-10-CM

## 2020-02-11 LAB — HM MAMMOGRAPHY

## 2020-02-11 LAB — HM PAP SMEAR: HM Pap smear: NEGATIVE

## 2020-02-13 ENCOUNTER — Other Ambulatory Visit: Payer: Self-pay

## 2020-02-13 ENCOUNTER — Ambulatory Visit
Admission: RE | Admit: 2020-02-13 | Discharge: 2020-02-13 | Disposition: A | Discharge status: Home / Self Care | Payer: Self-pay | Source: Ambulatory Visit | Attending: Obstetrics and Gynecology | Admitting: Obstetrics and Gynecology

## 2020-02-13 DIAGNOSIS — Z1231 Encounter for screening mammogram for malignant neoplasm of breast: Secondary | ICD-10-CM

## 2020-02-27 ENCOUNTER — Other Ambulatory Visit: Payer: Self-pay | Admitting: Family Medicine

## 2020-02-28 LAB — HM DIABETES EYE EXAM

## 2020-03-18 ENCOUNTER — Other Ambulatory Visit: Payer: Self-pay | Admitting: Family Medicine

## 2020-03-18 NOTE — Telephone Encounter (Signed)
Requested Prescriptions  Pending Prescriptions Disp Refills  . allopurinol (ZYLOPRIM) 300 MG tablet [Pharmacy Med Name: ALLOPURINOL 300 MG TABLET] 90 tablet 2    Sig: TAKE 1 TABLET BY MOUTH DAILY     Endocrinology:  Gout Agents Passed - 03/18/2020  1:35 AM      Passed - Uric Acid in normal range and within 360 days    Uric Acid  Date Value Ref Range Status  10/23/2019 4.7 3.0 - 7.2 mg/dL Final    Comment:               Therapeutic target for gout patients: <6.0         Passed - Cr in normal range and within 360 days    Creat  Date Value Ref Range Status  08/03/2016 0.83 0.50 - 1.05 mg/dL Final    Comment:      For patients > or = 63 years of age: The upper reference limit for Creatinine is approximately 13% higher for people identified as African-American.      Creatinine, Ser  Date Value Ref Range Status  10/23/2019 0.82 0.57 - 1.00 mg/dL Final         Passed - Valid encounter within last 12 months    Recent Outpatient Visits          4 months ago Chronic idiopathic gout involving toe of left foot without tophus   [facility] at [facility], Meda Coffee, MD   8 months ago Cellulitis of forearm, left   Primary Care at [facility], Eilleen Kempf, MD   11 months ago Tenosynovitis, de Public relations account executive   [facility] at [facility], Asencion Partridge, MD   11 months ago Type 2 diabetes mellitus without complication, without long-term current use of insulin [facility])   [facility] at [facility], Meda Coffee, MD   1 year ago Type 2 diabetes mellitus without complication, without long-term current use of insulin [facility])   Primary Care at [facility], Myrle Sheng, MD

## 2020-04-01 ENCOUNTER — Other Ambulatory Visit: Payer: Self-pay

## 2020-04-01 ENCOUNTER — Encounter: Payer: Self-pay | Admitting: Family Medicine

## 2020-04-01 ENCOUNTER — Ambulatory Visit (INDEPENDENT_AMBULATORY_CARE_PROVIDER_SITE_OTHER): Payer: Self-pay | Admitting: Family Medicine

## 2020-04-01 VITALS — BP 126/82 | HR 76 | Temp 98.0°F | Ht 72.0 in | Wt >= 6400 oz

## 2020-04-01 DIAGNOSIS — L299 Pruritus, unspecified: Secondary | ICD-10-CM

## 2020-04-01 DIAGNOSIS — E119 Type 2 diabetes mellitus without complications: Secondary | ICD-10-CM

## 2020-04-01 DIAGNOSIS — B001 Herpesviral vesicular dermatitis: Secondary | ICD-10-CM

## 2020-04-01 DIAGNOSIS — I10 Essential (primary) hypertension: Secondary | ICD-10-CM

## 2020-04-01 LAB — POCT GLYCOSYLATED HEMOGLOBIN (HGB A1C): Hemoglobin A1C: 6.8 % — AB (ref 4.0–5.6)

## 2020-04-01 MED ORDER — VALACYCLOVIR HCL 1 G PO TABS: ORAL_TABLET | ORAL | 3 refills | Status: DC

## 2020-04-01 MED ORDER — COLCHICINE 0.6 MG PO TABS: 0.6000 mg | ORAL_TABLET | Freq: Two times a day (BID) | ORAL | 5 refills | Status: DC | PRN

## 2020-04-01 MED ORDER — LOSARTAN POTASSIUM 25 MG PO TABS: 25.0000 mg | ORAL_TABLET | Freq: Every day | ORAL | 1 refills | Status: DC

## 2020-04-01 MED ORDER — ATORVASTATIN CALCIUM 40 MG PO TABS: 40.0000 mg | ORAL_TABLET | Freq: Every day | ORAL | 3 refills | Status: DC

## 2020-04-01 MED ORDER — AMLODIPINE BESYLATE 10 MG PO TABS: 10.0000 mg | ORAL_TABLET | Freq: Every day | ORAL | 3 refills | Status: DC

## 2020-04-01 MED ORDER — ALLOPURINOL 300 MG PO TABS: 300.0000 mg | ORAL_TABLET | Freq: Every day | ORAL | 2 refills | Status: DC

## 2020-04-01 MED ORDER — DIPHENHYDRAMINE-ZINC ACETATE 2-0.1 % EX CREA: 1.0000 "application " | TOPICAL_CREAM | Freq: Three times a day (TID) | CUTANEOUS | 0 refills | Status: DC | PRN

## 2020-04-01 NOTE — Progress Notes (Signed)
7/27/202111:21 AM  Mckenzie [name] [birth year], 64 y.o., female [id number]  Chief Complaint  Patient presents with  . Hypertension    medication refills   . Gout    refills   . Diabetes    not taking metformin due to diarrhea     HPI:   Patient is a 64 y.o. female with past medical history significant for HTN, DM2, gout and fever blisters who presents today for routine followup  Last OV June 2021 - no changes  Overall doing well Working hard on diet, cut back on carbs and sweets She never started metformin due to previous GI issues  Has not had anymore gout issues since last flare up Does not check bp at home Has completed her covid vaccines Had mammo and pap in June, Dr Viona Gilmore [facility] Had eye exam in June, no retinopathy, [facility], Dr Lorn Junes Readings from Last 3 Encounters:  04/01/20 (!) 407 lb (184.6 kg)  10/23/19 (!) 417 lb (189.1 kg)  07/19/19 (!) 400 lb (181.4 kg)   BP Readings from Last 3 Encounters:  04/01/20 126/82  10/23/19 114/64  03/30/19 138/78   Lab Results  Component Value Date   HGBA1C 7.7 (H) 10/23/2019   HGBA1C 7.6 (H) 03/30/2019   HGBA1C 6.5 (A) 03/20/2018   Lab Results  Component Value Date   LDLCALC 80 10/23/2019   CREATININE 0.82 10/23/2019   Lab Results  Component Value Date   LABURIC 4.7 10/23/2019    Depression screen PHQ 2/9 04/01/2020 10/23/2019 07/19/2019  Decreased Interest 0 0 0  Down, Depressed, Hopeless 0 0 0  PHQ - 2 Score 0 0 0    Fall Risk  04/01/2020 10/23/2019 07/19/2019 04/05/2019 03/20/2018  Falls in the past year? 0 0 0 0 No  Number falls in past yr: 0 0 - 0 -  Injury with Fall? 0 0 - 0 -  Follow up Falls evaluation completed Falls evaluation completed Falls evaluation completed Falls evaluation completed -     No Known Allergies  Prior to Admission medications   Medication Sig Start Date End Date Taking? Authorizing Provider  allopurinol (ZYLOPRIM) 300 MG tablet TAKE 1  TABLET BY MOUTH DAILY 03/18/20  Yes Rutherford Guys, MD  amLODipine (NORVASC) 10 MG tablet Take 1 tablet (10 mg total) by mouth daily. 03/30/19  Yes Rutherford Guys, MD  atorvastatin (LIPITOR) 40 MG tablet Take 1 tablet (40 mg total) by mouth daily. 04/09/19  Yes Rutherford Guys, MD  colchicine 0.6 MG tablet TAKE 1 TABLET BY MOUTH TWICE A DAY AS NEEDED 02/27/20  Yes Rutherford Guys, MD  losartan (COZAAR) 25 MG tablet TAKE 1 TABLET BY MOUTH EVERY DAY 10/02/19  Yes Rutherford Guys, MD  metFORMIN (GLUCOPHAGE) 500 MG tablet Take 1 tablet (500 mg total) by mouth 2 (two) times daily with a meal. 11/13/19  Yes Rutherford Guys, MD  valACYclovir (VALTREX) 1000 MG tablet 2 po at outbreak onset then repeat in 12h for each outbreak 10/23/19  Yes Rutherford Guys, MD  diphenhydrAMINE-zinc acetate (BENADRYL EXTRA STRENGTH) cream Apply 1 application topically 3 (three) times daily as needed for itching. Patient not taking: Reported on 04/01/2020 07/19/19   Horald Pollen, MD  Homeopathic Products (COLCIGEL) GEL Apply 2 Pump topically 4 (four) times daily as needed. Patient not taking: Reported on 04/01/2020 01/23/20   Rutherford Guys, MD  ibuprofen (ADVIL,MOTRIN) 800 MG tablet Take 1 tablet (800 mg total)  by mouth every 8 (eight) hours as needed. Patient not taking: Reported on 04/01/2020 03/20/18   Wardell Honour, MD    Past Medical History:  Diagnosis Date  . Diabetes mellitus without complication ([place])   . Gout   . Hypertension     No past surgical history on file.  Social History   Tobacco Use  . Smoking status: Never Smoker  . Smokeless tobacco: Never Used  Substance Use Topics  . Alcohol use: Yes    Family History  Problem Relation Age of Onset  . Alcohol abuse Father   . BRCA 1/2 Neg Hx     Review of Systems  Constitutional: Negative for chills and fever.  Respiratory: Negative for cough and shortness of breath.   Cardiovascular: Negative for chest pain, palpitations and leg  swelling.  Gastrointestinal: Negative for abdominal pain, nausea and vomiting.     OBJECTIVE:  Today's Vitals   04/01/20 1118  BP: 126/82  Pulse: 76  Temp: 98 F (36.7 C)  SpO2: 95%  Weight: (!) 407 lb (184.6 kg)  Height: 6' (1.829 m)   Body mass index is 55.2 kg/m.   Physical Exam Vitals and nursing note reviewed.  Constitutional:      Appearance: She is well-developed.  HENT:     Head: Normocephalic and atraumatic.     Mouth/Throat:     Pharynx: No oropharyngeal exudate.  Eyes:     General: No scleral icterus.    Extraocular Movements: Extraocular movements intact.     Conjunctiva/sclera: Conjunctivae normal.     Pupils: Pupils are equal, round, and reactive to light.  Cardiovascular:     Rate and Rhythm: Normal rate and regular rhythm.     Heart sounds: Normal heart sounds. No murmur heard.  No friction rub. No gallop.   Pulmonary:     [place]: Pulmonary effort is normal.     Breath sounds: Normal breath sounds. No wheezing, rhonchi or rales.  Musculoskeletal:     Cervical back: Neck supple.  Skin:    General: Skin is warm and dry.  Neurological:     Mental Status: She is alert and oriented to person, place, and time.     Results for orders placed or performed in visit on 04/01/20 (from the past 24 hour(s))  POCT glycosylated hemoglobin (Hb A1C)     Status: Abnormal   Collection Time: 04/01/20 11:47 AM  Result Value Ref Range   Hemoglobin A1C 6.8 (A) 4.0 - 5.6 %   HbA1c POC (<> result, manual entry)     HbA1c, POC (prediabetic range)     HbA1c, POC (controlled diabetic range)      No results found.   ASSESSMENT and PLAN  1. Type 2 diabetes mellitus without complication, without long-term current use of insulin (HCC) Diet controlled. Continue LFM - POCT glycosylated hemoglobin (Hb U7O) - Basic Metabolic Panel  2. Essential hypertension Controlled. Continue current regime.  - amLODipine (NORVASC) 10 MG tablet; Take 1 tablet (10 mg total) by  mouth daily.  3. Fever blister - valACYclovir (VALTREX) 1000 MG tablet; 2 po at outbreak onset then repeat in 12h for each outbreak  4. Itching - diphenhydrAMINE-zinc acetate (BENADRYL EXTRA STRENGTH) cream; Apply 1 application topically 3 (three) times daily as needed for itching.  Other orders - allopurinol (ZYLOPRIM) 300 MG tablet; Take 1 tablet (300 mg total) by mouth daily. - atorvastatin (LIPITOR) 40 MG tablet; Take 1 tablet (40 mg total) by mouth daily. - losartan (COZAAR)  25 MG tablet; Take 1 tablet (25 mg total) by mouth daily. - colchicine 0.6 MG tablet; Take 1 tablet (0.6 mg total) by mouth 2 (two) times daily as needed.  Return in about 6 months (around 10/02/2020).    Rutherford Guys, MD Primary Care at [facility] [place] [place], [place] [address] Ph.  [phone] Fax [phone]

## 2020-04-01 NOTE — Patient Instructions (Signed)
° ° ° °  If you have lab work done today you will be contacted with your lab results within the next 2 weeks.  If you have not heard from us then please contact us. The fastest way to get your results is to register for My Chart. ° ° °IF you received an x-ray today, you will receive an invoice from [facility]. Please contact [facility] at [phone] with questions or concerns regarding your invoice.  ° °IF you received labwork today, you will receive an invoice from [facility]. Please contact [facility] at [phone] with questions or concerns regarding your invoice.  ° °Our billing staff will not be able to assist you with questions regarding bills from these companies. ° °You will be contacted with the lab results as soon as they are available. The fastest way to get your results is to activate your My Chart account. Instructions are located on the last page of this paperwork. If you have not heard from us regarding the results in 2 weeks, please contact this office. °  ° ° ° °

## 2020-04-02 LAB — BASIC METABOLIC PANEL
BUN/Creatinine Ratio: 24 (ref 12–28)
BUN: 15 mg/dL (ref 8–27)
CO2: 30 mmol/L — ABNORMAL HIGH (ref 20–29)
Calcium: 9.4 mg/dL (ref 8.7–10.3)
Chloride: 104 mmol/L (ref 96–106)
Creatinine, Ser: 0.63 mg/dL (ref 0.57–1.00)
GFR calc Af Amer: 111 mL/min/{1.73_m2} (ref >59)
GFR calc non Af Amer: 96 mL/min/{1.73_m2} (ref >59)
Glucose: 127 mg/dL — ABNORMAL HIGH (ref 65–99)
Potassium: 3.9 mmol/L (ref 3.5–5.2)
Sodium: 145 mmol/L — ABNORMAL HIGH (ref 134–144)

## 2020-07-20 IMAGING — MG DIGITAL SCREENING BILATERAL MAMMOGRAM WITH CAD
8 of 9 series · 8 of 9 positions shown · non-contrast
Comparison: Previous exam(s).

ACR Breast Density Category a: The breast tissue is almost entirely
fatty.

CLINICAL DATA: Screening.

EXAM:
DIGITAL SCREENING BILATERAL MAMMOGRAM WITH CAD

[R CC (1 of 2)]
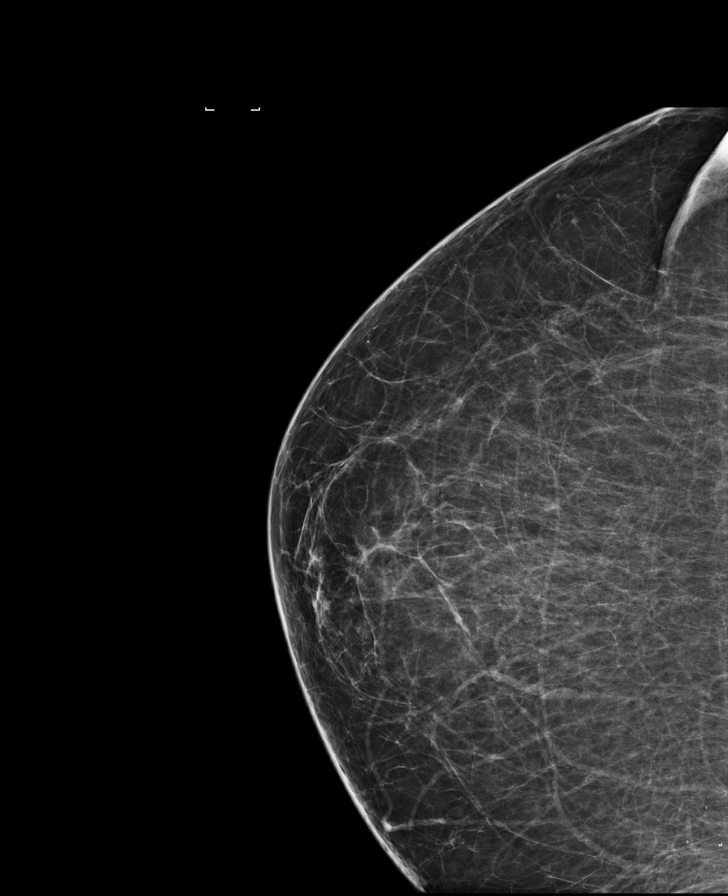

[L MLO (1 of 2)]
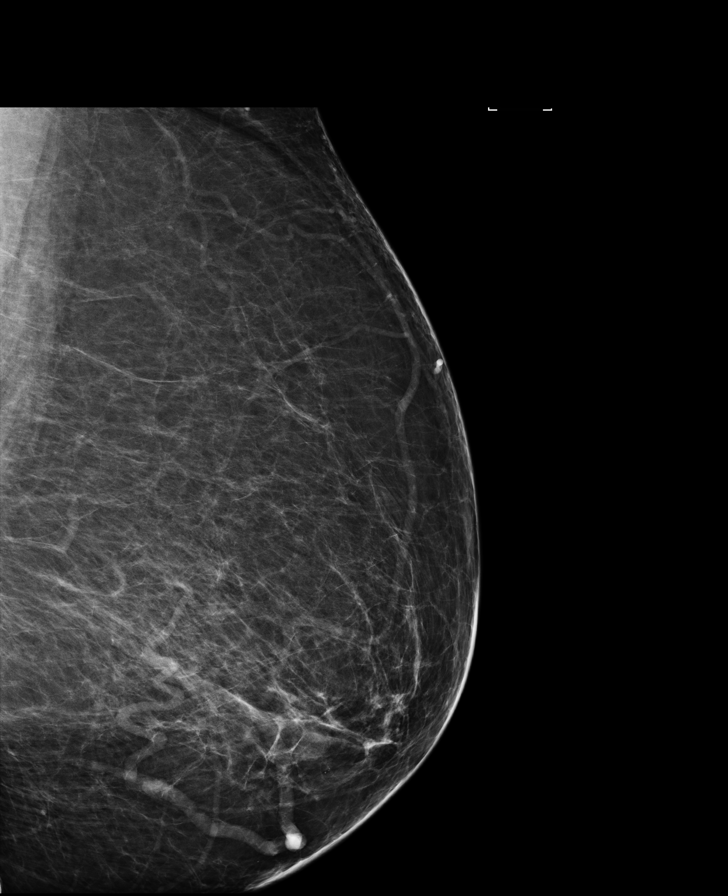

[R MLO]
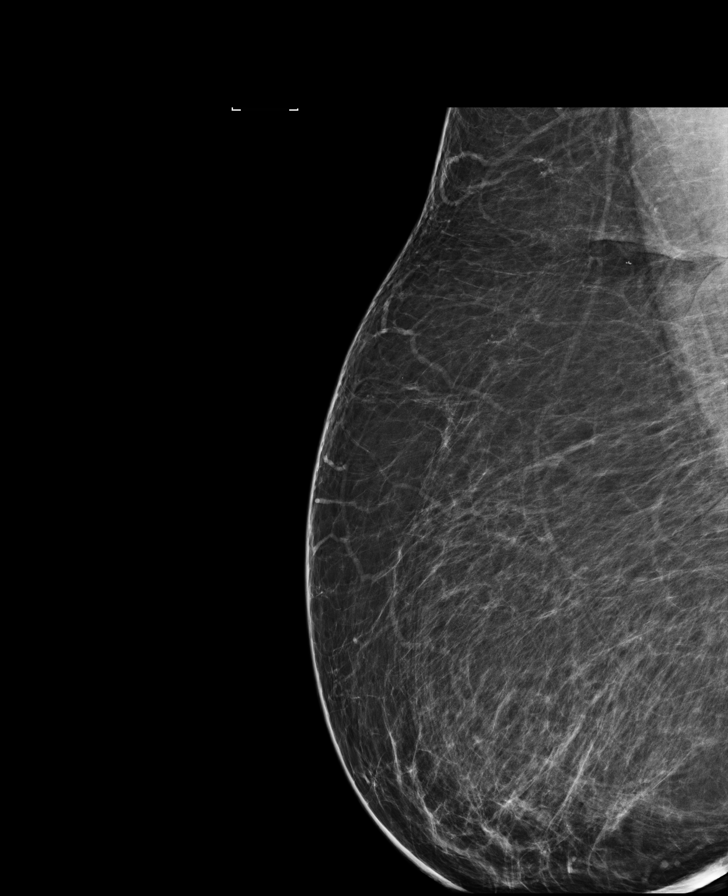

[R CC (2 of 2)]
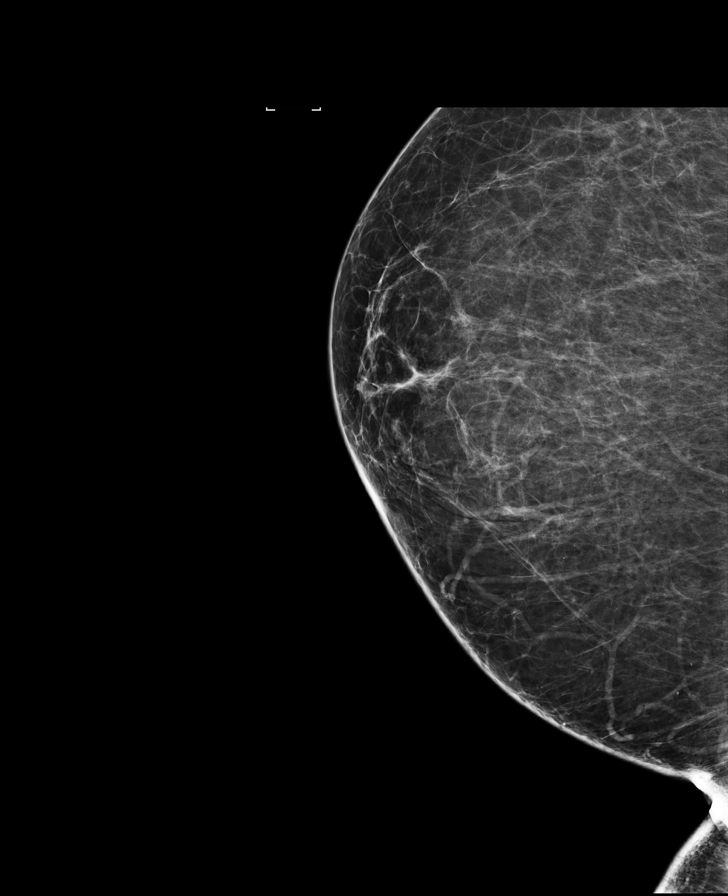

[L CC (1 of 3)]
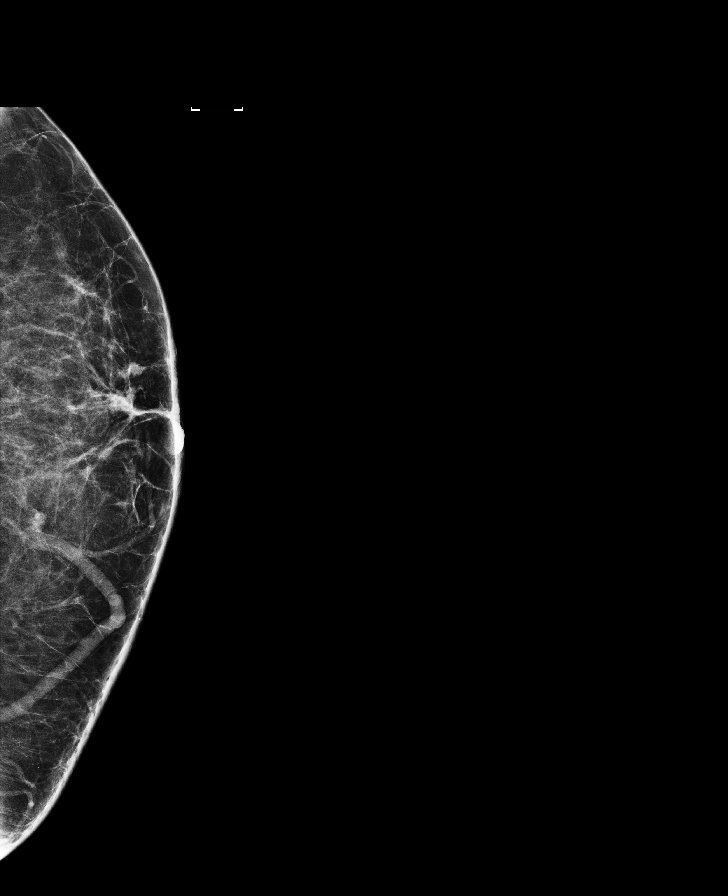

[L CC (2 of 3)]
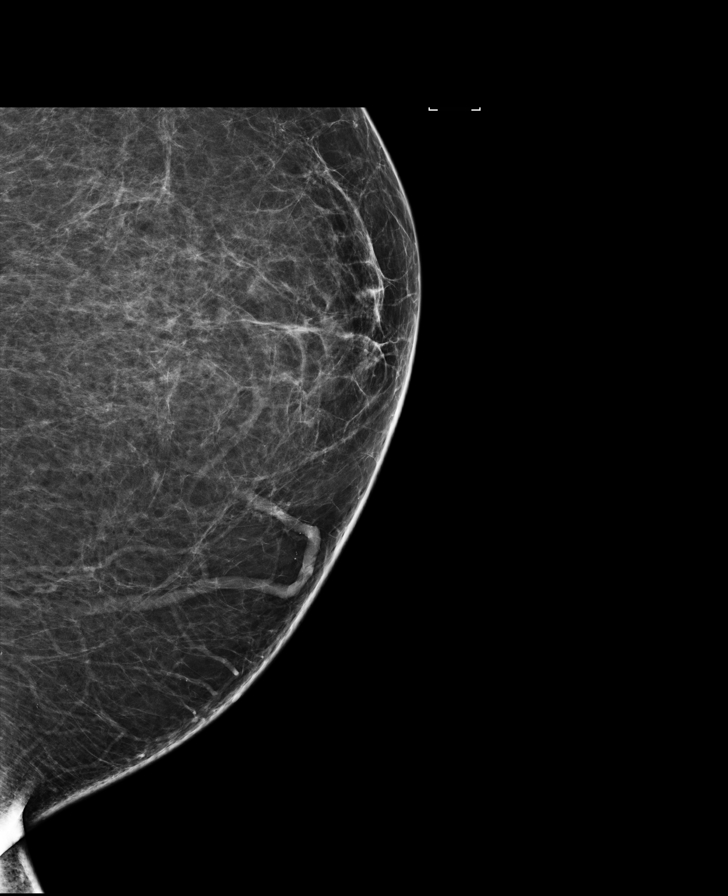

[L MLO (2 of 2)]
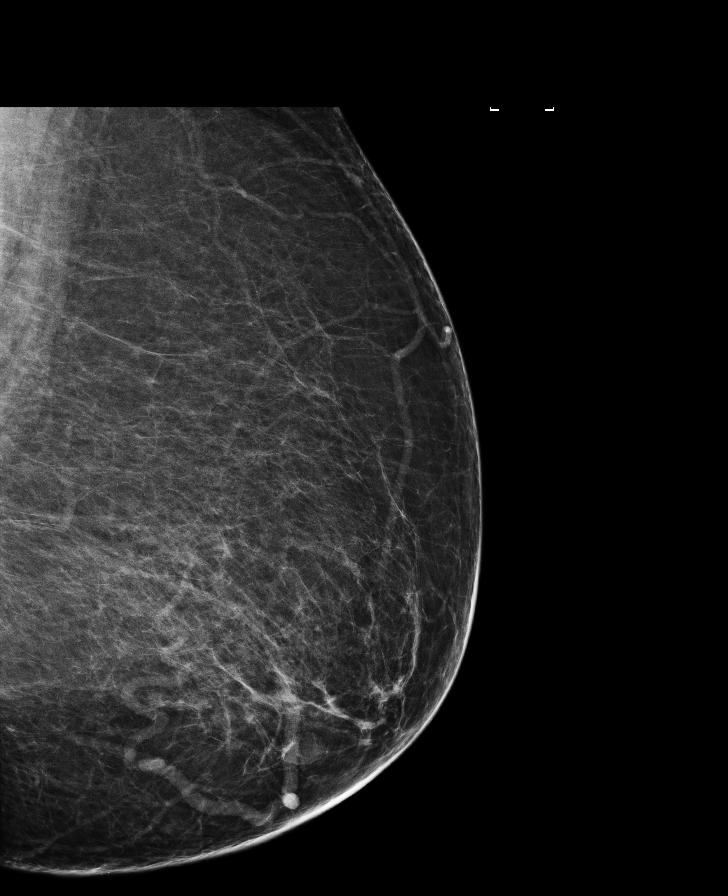

[L CC (3 of 3)]
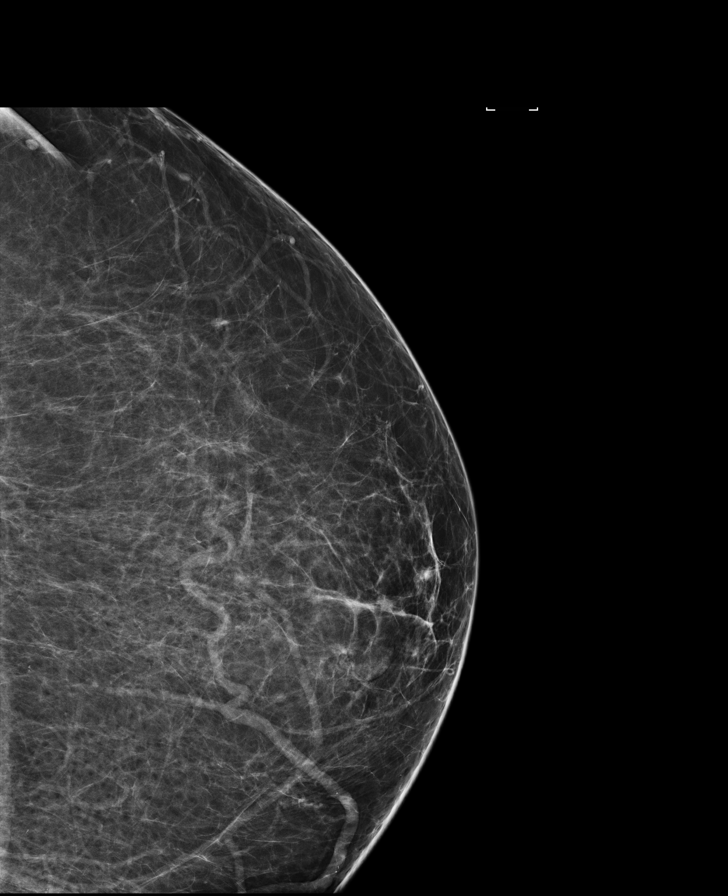

[8 of 9 positions shown; findings below may reference images not displayed]

FINDINGS: There are no findings suspicious for malignancy. Images were
processed with CAD.
IMPRESSION: No mammographic evidence of malignancy. A result letter of this
screening mammogram will be mailed directly to the patient.

RECOMMENDATION:
Screening mammogram in one year. (Code:[id number])

BI-RADS CATEGORY  1: Negative.

## 2020-08-14 ENCOUNTER — Ambulatory Visit (INDEPENDENT_AMBULATORY_CARE_PROVIDER_SITE_OTHER): Payer: Self-pay | Admitting: Family Medicine

## 2020-08-14 ENCOUNTER — Other Ambulatory Visit: Payer: Self-pay

## 2020-08-14 ENCOUNTER — Encounter: Payer: Self-pay | Admitting: Family Medicine

## 2020-08-14 VITALS — BP 135/86 | HR 72 | Temp 97.7°F | Ht 72.0 in | Wt >= 6400 oz

## 2020-08-14 DIAGNOSIS — I10 Essential (primary) hypertension: Secondary | ICD-10-CM

## 2020-08-14 DIAGNOSIS — Z23 Encounter for immunization: Secondary | ICD-10-CM

## 2020-08-14 DIAGNOSIS — E1169 Type 2 diabetes mellitus with other specified complication: Secondary | ICD-10-CM | POA: Insufficient documentation

## 2020-08-14 DIAGNOSIS — M1A0720 Idiopathic chronic gout, left ankle and foot, without tophus (tophi): Secondary | ICD-10-CM | POA: Insufficient documentation

## 2020-08-14 DIAGNOSIS — E785 Hyperlipidemia, unspecified: Secondary | ICD-10-CM

## 2020-08-14 DIAGNOSIS — E119 Type 2 diabetes mellitus without complications: Secondary | ICD-10-CM

## 2020-08-14 LAB — HEMOGLOBIN A1C
Est. average glucose Bld gHb Est-mCnc: 148 mg/dL
Hgb A1c MFr Bld: 6.8 % — ABNORMAL HIGH (ref 4.8–5.6)

## 2020-08-14 MED ORDER — LOSARTAN POTASSIUM 25 MG PO TABS: 25.0000 mg | ORAL_TABLET | Freq: Every day | ORAL | 1 refills | Status: DC

## 2020-08-14 MED ORDER — COLCHICINE 0.6 MG PO TABS: 0.6000 mg | ORAL_TABLET | Freq: Two times a day (BID) | ORAL | 5 refills | Status: DC | PRN

## 2020-08-14 MED ORDER — PREDNISONE 20 MG PO TABS: ORAL_TABLET | ORAL | 0 refills | Status: AC

## 2020-08-14 MED ORDER — LOSARTAN POTASSIUM 25 MG PO TABS: 25.0000 mg | ORAL_TABLET | Freq: Every day | ORAL | 3 refills | Status: DC

## 2020-08-14 NOTE — Progress Notes (Signed)
12/9/202111:59 AM  Mckenzie [name] 12/21/1955, 64 y.o., female [id number]  Chief Complaint  Patient presents with  . gout flare    Request rx for prednisone and a printed script for colchicine    HPI:   Patient is a 64 y.o. female with past medical history significant for HTN, DM2, gout and fever blisters who presents today for routine follow-up.  Travels frequently for work Has completed her covid vaccines Had mammo and pap in June, Dr Viona Gilmore [facility] Had eye exam in June, no retinopathy, [facility], Dr Len Childs  1. Type 2 diabetes mellitus without complication, without long-term current use of insulin (HCC) Diet controlled. Continue LFM Self pay Never took the prescribed metformin, not a tolerance issue atorvastatin (LIPITOR) 40 MG tablet; Take 1 tablet (40 mg total) by mouth daily. Lab Results  Component Value Date   HGBA1C 6.8 (A) 04/01/2020    Essential hypertension Controlled. Continue current regime.  Does not check bp at home amLODipine (NORVASC) 10 MG tablet; Take 1 tablet (10 mg total) by mouth daily. losartan (COZAAR) 25 MG tablet; Take 1 tablet (25 mg total) by mouth daily. BP Readings from Last 3 Encounters:  08/14/20 135/86  04/01/20 126/82  10/23/19 114/64    Fever blisters  valACYclovir (VALTREX) 1000 MG tablet; 2 po at outbreak onset then repeat in 12h for each outbreak  Gout allopurinol (ZYLOPRIM) 300 MG tablet; Take 1 tablet (300 mg total) by mouth daily. colchicine 0.6 MG tablet; Take 1 tablet (0.6 mg total) by mouth 2 (two) times daily as needed. Has flares 3 times per year Takes prednisone rarely   Depression screen [facility] 2/9 08/14/2020 04/01/2020 10/23/2019  Decreased Interest 0 0 0  Down, Depressed, Hopeless 0 0 0  PHQ - 2 Score 0 0 0    Fall Risk  08/14/2020 04/01/2020 10/23/2019 07/19/2019 04/05/2019  Falls in the past year? 0 0 0 0 0  Number falls in past yr: 0 0 0 - 0  Injury with Fall? 0 0 0 - 0  Follow  up _0      No Known Allergies  Prior to Admission medications   Medication Sig Start Date End Date Taking? Authorizing Provider  allopurinol (ZYLOPRIM) 300 MG tablet Take 1 tablet (300 mg total) by mouth daily. 04/01/20   Rutherford Guys, MD  amLODipine (NORVASC) 10 MG tablet Take 1 tablet (10 mg total) by mouth daily. 04/01/20   Rutherford Guys, MD  atorvastatin (LIPITOR) 40 MG tablet Take 1 tablet (40 mg total) by mouth daily. 04/01/20   Rutherford Guys, MD  colchicine 0.6 MG tablet Take 1 tablet (0.6 mg total) by mouth 2 (two) times daily as needed. 04/01/20   Rutherford Guys, MD  diphenhydrAMINE-zinc acetate (BENADRYL EXTRA STRENGTH) cream Apply 1 application topically 3 (three) times daily as needed for itching. 04/01/20   Rutherford Guys, MD  ibuprofen (ADVIL,MOTRIN) 800 MG tablet Take 1 tablet (800 mg total) by mouth every 8 (eight) hours as needed. Patient not taking: Reported on 04/01/2020 03/20/18   Wardell Honour, MD  losartan (COZAAR) 25 MG tablet Take 1 tablet (25 mg total) by mouth daily. 04/01/20   Rutherford Guys, MD  valACYclovir (VALTREX) 1000 MG tablet 2 po at outbreak onset then repeat in 12h for each outbreak 04/01/20   Rutherford Guys, MD    Past Medical History:  Diagnosis  Date  . Diabetes mellitus without complication ([place])   . Gout   . Hypertension     History reviewed. No pertinent surgical history.  Social History   Tobacco Use  . Smoking status: Never Smoker  . Smokeless tobacco: Never Used  Substance Use Topics  . Alcohol use: Yes    Family History  Problem Relation Age of Onset  . Alcohol abuse Father   . BRCA 1/2 Neg Hx     Review of Systems  Constitutional: Negative for chills, fever and malaise/fatigue.  Eyes: Negative for blurred vision and double vision.  Respiratory: Negative for cough, shortness of breath  and wheezing.   Cardiovascular: Negative for chest pain, palpitations and leg swelling.  Gastrointestinal: Negative for abdominal pain, blood in stool, constipation, diarrhea, heartburn, nausea and vomiting.  Genitourinary: Negative for dysuria, frequency and hematuria.  Musculoskeletal: Negative for back pain and joint pain.  Skin: Negative for rash.       Issues with gout left foot, pain  Neurological: Negative for dizziness, weakness and headaches.     OBJECTIVE:  Today's Vitals   08/14/20 1131  BP: 135/86  Pulse: 72  Temp: 97.7 F (36.5 C)  SpO2: 94%  Weight: (!) 417 lb (189.1 kg)  Height: 6' (1.829 m)   Body mass index is 56.56 kg/m.   Physical Exam Constitutional:      General: She is not in acute distress.    Appearance: Normal appearance. She is not ill-appearing.  HENT:     Head: Normocephalic.  Cardiovascular:     Rate and Rhythm: Normal rate and regular rhythm.     Pulses: Normal pulses.     Heart sounds: Normal heart sounds. No murmur heard. No friction rub. No gallop.   Pulmonary:     [place]: Pulmonary effort is normal. No respiratory distress.     Breath sounds: Normal breath sounds. No stridor. No wheezing, rhonchi or rales.  Abdominal:     General: Bowel sounds are normal.     Palpations: Abdomen is soft.     Tenderness: There is no abdominal tenderness.  Musculoskeletal:        General: Tenderness (Left foot: gout) present.     Right lower leg: Edema present.     Left lower leg: Edema present.  Skin:    General: Skin is warm and dry.  Neurological:     Mental Status: She is alert and oriented to person, place, and time.  Psychiatric:        Mood and Affect: Mood normal.        Behavior: Behavior normal.     No results found for this or any previous visit (from the past 24 hour(s)).  No results found.   ASSESSMENT and PLAN  Problem List Items Addressed This Visit      Cardiovascular and Mediastinum   Essential hypertension    Relevant Medications   losartan (COZAAR) 25 MG tablet At goal< 140/90     Endocrine   Type 2 diabetes mellitus without complication, without long-term current use of insulin (HCC) - Primary   Relevant Medications   losartan (COZAAR) 25 MG tablet   Other Relevant Orders   HM Diabetes Foot Exam (Completed)   Hemoglobin A1c Will follow up with lab results    Hyperlipidemia associated with type 2 diabetes mellitus ([place])   Relevant Medications   losartan (COZAAR) 25 MG tablet     Other   Morbid obesity ([place])   Gout   Relevant  Medications   colchicine 0.6 MG tablet   predniSONE (DELTASONE) 20 MG tablet for flare  Discussed r/se/b      Return in about 3 months (around 11/12/2020) for Next scheduled appt.   Huston Foley Samirah Scarpati, FNP-BC Primary Care at [place],  [address] Ph.  [phone] Fax [phone]

## 2020-08-14 NOTE — Patient Instructions (Addendum)
  Health Maintenance, Female Adopting a healthy lifestyle and getting preventive care are important in promoting health and wellness. Ask your health care provider about:  The right schedule for you to have regular tests and exams.  Things you can do on your own to prevent diseases and keep yourself healthy. What should I know about diet, weight, and exercise? Eat a healthy diet   Eat a diet that includes plenty of vegetables, fruits, low-fat dairy products, and lean protein.  Do not eat a lot of foods that are high in solid fats, added sugars, or sodium. Maintain a healthy weight Body mass index (BMI) is used to identify weight problems. It estimates body fat based on height and weight. Your health care provider can help determine your BMI and help you achieve or maintain a healthy weight. Get regular exercise Get regular exercise. This is one of the most important things you can do for your health. Most adults should:  Exercise for at least 150 minutes each week. The exercise should increase your heart rate and make you sweat (moderate-intensity exercise).  Do strengthening exercises at least twice a week. This is in addition to the moderate-intensity exercise.  Spend less time sitting. Even light physical activity can be beneficial. Watch cholesterol and blood lipids Have your blood tested for lipids and cholesterol at 63 years of age, then have this test every 5 years. Have your cholesterol levels checked more often if:  Your lipid or cholesterol levels are high.  You are older than 63 years of age.  You are at high risk for heart disease. What should I know about cancer screening? Depending on your health history and family history, you may need to have cancer screening at various ages. This may include screening for:  Breast cancer.  Cervical cancer.  Colorectal cancer.  Skin cancer.  Lung cancer. What should I know about heart disease, diabetes, and high blood  pressure? Blood pressure and heart disease  High blood pressure causes heart disease and increases the risk of stroke. This is more likely to develop in people who have high blood pressure readings, are of African descent, or are overweight.  Have your blood pressure checked: ? Every 3-5 years if you are 18-39 years of age. ? Every year if you are 40 years old or older. Diabetes Have regular diabetes screenings. This checks your fasting blood sugar level. Have the screening done:  Once every three years after age 40 if you are at a normal weight and have a low risk for diabetes.  More often and at a younger age if you are overweight or have a high risk for diabetes. What should I know about preventing infection? Hepatitis B If you have a higher risk for hepatitis B, you should be screened for this virus. Talk with your health care provider to find out if you are at risk for hepatitis B infection. Hepatitis C Testing is recommended for:  Everyone born from 1945 through 1965.  Anyone with known risk factors for hepatitis C. Sexually transmitted infections (STIs)  Get screened for STIs, including gonorrhea and chlamydia, if: ? You are sexually active and are younger than 63 years of age. ? You are older than 63 years of age and your health care provider tells you that you are at risk for this type of infection. ? Your sexual activity has changed since you were last screened, and you are at increased risk for chlamydia or gonorrhea. Ask your health care   provider if you are at risk.  Ask your health care provider about whether you are at high risk for HIV. Your health care provider may recommend a prescription medicine to help prevent HIV infection. If you choose to take medicine to prevent HIV, you should first get tested for HIV. You should then be tested every 3 months for as long as you are taking the medicine. Pregnancy  If you are about to stop having your period (premenopausal) and  you may become pregnant, seek counseling before you get pregnant.  Take 400 to 800 micrograms (mcg) of folic acid every day if you become pregnant.  Ask for birth control (contraception) if you want to prevent pregnancy. Osteoporosis and menopause Osteoporosis is a disease in which the bones lose minerals and strength with aging. This can result in bone fractures. If you are 65 years old or older, or if you are at risk for osteoporosis and fractures, ask your health care provider if you should:  Be screened for bone loss.  Take a calcium or vitamin D supplement to lower your risk of fractures.  Be given hormone replacement therapy (HRT) to treat symptoms of menopause. Follow these instructions at home: Lifestyle  Do not use any products that contain nicotine or tobacco, such as cigarettes, e-cigarettes, and chewing tobacco. If you need help quitting, ask your health care provider.  Do not use street drugs.  Do not share needles.  Ask your health care provider for help if you need support or information about quitting drugs. Alcohol use  Do not drink alcohol if: ? Your health care provider tells you not to drink. ? You are pregnant, may be pregnant, or are planning to become pregnant.  If you drink alcohol: ? Limit how much you use to 0-1 drink a day. ? Limit intake if you are breastfeeding.  Be aware of how much alcohol is in your drink. In the [place] one drink equals one 12 oz bottle of beer (355 mL), one 5 oz glass of wine (148 mL), or one 1 oz glass of hard liquor (44 mL). General instructions  Schedule regular health, dental, and eye exams.  Stay current with your vaccines.  Tell your health care provider if: ? You often feel depressed. ? You have ever been abused or do not feel safe at home. Summary  Adopting a healthy lifestyle and getting preventive care are important in promoting health and wellness.  Follow your health care provider's instructions about healthy  diet, exercising, and getting tested or screened for diseases.  Follow your health care provider's instructions on monitoring your cholesterol and blood pressure. This information is not intended to replace advice given to you by your health care provider. Make sure you discuss any questions you have with your health care provider. Document Revised: 08/16/2018 Document Reviewed: 08/16/2018 Elsevier Patient Education  2020 [employer]   If you have lab work done today you will be contacted with your lab results within the next 2 weeks.  If you have not heard from us then please contact us. The fastest way to get your results is to register for My Chart.   IF you received an x-ray today, you will receive an invoice from [facility]. Please contact [facility] at [phone] with questions or concerns regarding your invoice.   IF you received labwork today, you will receive an invoice from [facility]. Please contact [facility] at [phone] with questions or concerns regarding your invoice.   Our billing staff   will not be able to assist you with questions regarding bills from these companies.  You will be contacted with the lab results as soon as they are available. The fastest way to get your results is to activate your My Chart account. Instructions are located on the last page of this paperwork. If you have not heard from us regarding the results in 2 weeks, please contact this office.      

## 2020-11-12 ENCOUNTER — Encounter: Payer: Self-pay | Admitting: Family Medicine

## 2021-01-19 ENCOUNTER — Other Ambulatory Visit: Payer: Self-pay | Admitting: Family Medicine

## 2021-01-19 DIAGNOSIS — Z1231 Encounter for screening mammogram for malignant neoplasm of breast: Secondary | ICD-10-CM

## 2021-01-22 LAB — HM DIABETES EYE EXAM

## 2021-02-17 ENCOUNTER — Ambulatory Visit
Admission: RE | Admit: 2021-02-17 | Discharge: 2021-02-17 | Disposition: A | Discharge status: Home / Self Care | Payer: No Typology Code available for payment source | Source: Ambulatory Visit | Attending: Family Medicine | Admitting: Family Medicine

## 2021-02-17 ENCOUNTER — Other Ambulatory Visit: Payer: Self-pay | Admitting: Family Medicine

## 2021-02-17 ENCOUNTER — Other Ambulatory Visit: Payer: Self-pay

## 2021-02-17 DIAGNOSIS — Z1231 Encounter for screening mammogram for malignant neoplasm of breast: Secondary | ICD-10-CM

## 2021-02-24 DIAGNOSIS — R635 Abnormal weight gain: Secondary | ICD-10-CM | POA: Insufficient documentation

## 2021-02-24 DIAGNOSIS — Z800 Family history of malignant neoplasm of digestive organs: Secondary | ICD-10-CM | POA: Insufficient documentation

## 2021-03-17 ENCOUNTER — Ambulatory Visit: Payer: Self-pay

## 2021-03-24 ENCOUNTER — Ambulatory Visit: Payer: Self-pay | Admitting: Family Medicine

## 2021-03-25 ENCOUNTER — Ambulatory Visit (INDEPENDENT_AMBULATORY_CARE_PROVIDER_SITE_OTHER): Payer: Self-pay | Admitting: Family Medicine

## 2021-03-25 ENCOUNTER — Other Ambulatory Visit: Payer: Self-pay

## 2021-03-25 ENCOUNTER — Telehealth: Payer: Self-pay | Admitting: Family Medicine

## 2021-03-25 ENCOUNTER — Encounter: Payer: Self-pay | Admitting: Family Medicine

## 2021-03-25 VITALS — BP 143/77 | HR 77 | Temp 98.3°F | Ht 71.0 in | Wt >= 6400 oz

## 2021-03-25 DIAGNOSIS — E785 Hyperlipidemia, unspecified: Secondary | ICD-10-CM

## 2021-03-25 DIAGNOSIS — E119 Type 2 diabetes mellitus without complications: Secondary | ICD-10-CM

## 2021-03-25 DIAGNOSIS — Z6841 Body Mass Index (BMI) 40.0 and over, adult: Secondary | ICD-10-CM | POA: Insufficient documentation

## 2021-03-25 DIAGNOSIS — B001 Herpesviral vesicular dermatitis: Secondary | ICD-10-CM

## 2021-03-25 DIAGNOSIS — Z0000 Encounter for general adult medical examination without abnormal findings: Secondary | ICD-10-CM

## 2021-03-25 DIAGNOSIS — I10 Essential (primary) hypertension: Secondary | ICD-10-CM

## 2021-03-25 DIAGNOSIS — M1A0720 Idiopathic chronic gout, left ankle and foot, without tophus (tophi): Secondary | ICD-10-CM

## 2021-03-25 DIAGNOSIS — E1169 Type 2 diabetes mellitus with other specified complication: Secondary | ICD-10-CM

## 2021-03-25 DIAGNOSIS — M25512 Pain in left shoulder: Secondary | ICD-10-CM

## 2021-03-25 DIAGNOSIS — M25511 Pain in right shoulder: Secondary | ICD-10-CM

## 2021-03-25 DIAGNOSIS — G8929 Other chronic pain: Secondary | ICD-10-CM | POA: Insufficient documentation

## 2021-03-25 LAB — CBC WITH DIFFERENTIAL/PLATELET
Basophils Absolute: 0 10*3/uL (ref 0.0–0.1)
Basophils Relative: 0.4 % (ref 0.0–3.0)
Eosinophils Absolute: 0.3 10*3/uL (ref 0.0–0.7)
Eosinophils Relative: 3.3 % (ref 0.0–5.0)
HCT: 40.2 % (ref 36.0–46.0)
Hemoglobin: 13.4 g/dL (ref 12.0–15.0)
Lymphocytes Relative: 39.5 % (ref 12.0–46.0)
Lymphs Abs: 3 10*3/uL (ref 0.7–4.0)
MCHC: 33.2 g/dL (ref 30.0–36.0)
MCV: 89.3 fl (ref 78.0–100.0)
Monocytes Absolute: 0.6 10*3/uL (ref 0.1–1.0)
Monocytes Relative: 7.6 % (ref 3.0–12.0)
Neutro Abs: 3.7 10*3/uL (ref 1.4–7.7)
Neutrophils Relative %: 49.2 % (ref 43.0–77.0)
Platelets: 272 10*3/uL (ref 150.0–400.0)
RBC: 4.5 Mil/uL (ref 3.87–5.11)
RDW: 15.9 % — ABNORMAL HIGH (ref 11.5–15.5)
WBC: 7.6 10*3/uL (ref 4.0–10.5)

## 2021-03-25 LAB — LIPID PANEL
Cholesterol: 180 mg/dL (ref 0–200)
HDL: 72.8 mg/dL (ref >39.00)
LDL Cholesterol: 97 mg/dL (ref 0–99)
NonHDL: 107.33
Total CHOL/HDL Ratio: 2
Triglycerides: 54 mg/dL (ref 0.0–149.0)
VLDL: 10.8 mg/dL (ref 0.0–40.0)

## 2021-03-25 LAB — COMPREHENSIVE METABOLIC PANEL
ALT: 25 U/L (ref 0–35)
AST: 20 U/L (ref 0–37)
Albumin: 4 g/dL (ref 3.5–5.2)
Alkaline Phosphatase: 87 U/L (ref 39–117)
BUN: 17 mg/dL (ref 6–23)
CO2: 31 mEq/L (ref 19–32)
Calcium: 10 mg/dL (ref 8.4–10.5)
Chloride: 101 mEq/L (ref 96–112)
Creatinine, Ser: 0.78 mg/dL (ref 0.40–1.20)
GFR: 80.73 mL/min (ref >60.00)
Glucose, Bld: 107 mg/dL — ABNORMAL HIGH (ref 70–99)
Potassium: 4.1 mEq/L (ref 3.5–5.1)
Sodium: 143 mEq/L (ref 135–145)
Total Bilirubin: 0.8 mg/dL (ref 0.2–1.2)
Total Protein: 7.4 g/dL (ref 6.0–8.3)

## 2021-03-25 LAB — TSH: TSH: 2.42 u[IU]/mL (ref 0.35–5.50)

## 2021-03-25 LAB — URIC ACID: Uric Acid, Serum: 5.8 mg/dL (ref 2.4–7.0)

## 2021-03-25 MED ORDER — ALLOPURINOL 300 MG PO TABS: 300.0000 mg | ORAL_TABLET | Freq: Every day | ORAL | 3 refills | Status: DC

## 2021-03-25 MED ORDER — BACLOFEN 10 MG PO TABS: 10.0000 mg | ORAL_TABLET | Freq: Three times a day (TID) | ORAL | 0 refills | Status: DC

## 2021-03-25 MED ORDER — LOSARTAN POTASSIUM 50 MG PO TABS: 50.0000 mg | ORAL_TABLET | Freq: Every day | ORAL | 1 refills | Status: DC

## 2021-03-25 MED ORDER — ATORVASTATIN CALCIUM 40 MG PO TABS: 40.0000 mg | ORAL_TABLET | Freq: Every day | ORAL | 3 refills | Status: DC

## 2021-03-25 MED ORDER — VALACYCLOVIR HCL 1 G PO TABS: ORAL_TABLET | ORAL | 2 refills | Status: DC

## 2021-03-25 MED ORDER — ATORVASTATIN CALCIUM 40 MG PO TABS: 60.0000 mg | ORAL_TABLET | Freq: Every day | ORAL | 3 refills | Status: DC

## 2021-03-25 MED ORDER — COLCHICINE 0.6 MG PO TABS: 0.6000 mg | ORAL_TABLET | Freq: Two times a day (BID) | ORAL | 5 refills | Status: DC | PRN

## 2021-03-25 MED ORDER — PREDNISONE 20 MG PO TABS: ORAL_TABLET | ORAL | 0 refills | Status: DC

## 2021-03-25 MED ORDER — AMLODIPINE BESYLATE 10 MG PO TABS: 10.0000 mg | ORAL_TABLET | Freq: Every day | ORAL | 1 refills | Status: DC

## 2021-03-25 NOTE — Patient Instructions (Addendum)
Great to see you today.  I have refilled the medication(s) we provide.   If labs were collected, we will inform you of lab results once received either by echart message or telephone call.   - echart message- for normal results that have been seen by the patient already.   - telephone call: abnormal results or if patient has not viewed results in their echart.  A1c looks great at 6.1! Goal BP < 130/80.   If you are age 64 or older, your body mass index should be between 23-30. Your Body mass index is Body mass index is 56.07 kg/m.Marland Kitchen If this is above the aforementioned range listed, you are consider overweight and if BMI > 35 you are conisdered obese, by medical definition and standards. Routine daily exercise and dietary modifications are encouraged. If you would like additional guidance on weight loss, please make an appointment for weight loss counseling - must be an appointment dedicate to weight loss counseling alone. I would be happy to help you.   If you are age 41 or younger, your body mass index should be between 19-25. Your Body mass index is Body mass index is 56.07 kg/m. Marland Kitchen If this is above the aformentioned range listed, you are consider overwieght and obese if BMI > 30, by medical definition and standards. Routine daily exercise and dietary modifications are encouraged. If you would like additional guidance on weight loss, please make an appointment for weight loss counseling - must be an appointment dedicate to weight loss counseling alone. I would be happy to help you.

## 2021-03-25 NOTE — Progress Notes (Signed)
Patient ID: Mckenzie [name], female  DOB: 09-24-1955, 65 y.o.   MRN: [id number] Patient Care Team    Relationship Specialty Notifications Start End  Ma Hillock, DO PCP - General Family Medicine  03/25/21   Avel Sensor, MD Consulting Physician Obstetrics and Gynecology  03/30/19   Juanita Craver, MD Consulting Physician Gastroenterology  03/30/19     Chief Complaint  Patient presents with   Establish Care   Diabetes    [facility];     Subjective:  Mckenzie [name] is a 65 y.o.  female present for new patient establishment. All past medical history, surgical history, allergies, family history, immunizations, medications and social history were updated in the electronic medical record today. All recent labs, ED visits and hospitalizations within the last year were reviewed.  Essential hypertension/HLD/morbid obesity Pt reports compliance with amlodipine 10 mg daily, Lipitor 40 mg daily, losartan 25 mg daily. Blood pressures ranges at home not routinely checked. Patient denies chest pain, shortness of breath or lower extremity edema. Pt does not take a daily baby ASA. Pt is  prescribed statin. RF: Hypertension, hyperlipidemia, diabetes, obesity, family history of heart disease  Chronic idiopathic gout involving toe of left foot without tophus Patient reports she has had to take allopurinol 300 mg daily to keep her gout controlled.  She is tolerating this medication.  She has had a recent gout flare and she was out of colchicine.  She states with her gout flare she usually takes colchicine.  She did have a steroid injection at that particular gout flare.  She states she knows she does not follow a gout friendly diet since she loves shellfish and asparagus.  Fever blister Patient reports she gets fever blisters sometimes 2 times a year.  She states last year she did not have any fever blisters but she likes to have Valtrex on hand for when this occurs.  Hyperlipidemia associated with  type 2 diabetes mellitus ([place]) Pt reports he has been a diet-controlled diabetic for some time.  She has not been prescribed medications.  She knows she would not be able to tolerate metformin if she did not need medication secondary to GI side effects.  Denies numbness, tingling of extremities, hypo/hyperglycemic events or non-healing wounds.  She reports she tries to watch her diet and she exercises routinely. PNA series: We will offer next appointment. Flu shot: (recommneded yearly) Foot exam: UTD 08/14/2020 Eye exam: Due this month  Bilateral shoulder pain: Patient reports last night she had rather significant bilateral shoulder pain.  She had to take a Tylenol and use topical OTC medications to help with the pain.  She denies any injury.  She has a history of gout.  She has a history of arthritis.  She reports she has to watch with her use of Advil or naproxen because if she uses a few days in a row she will have bleeding.  Depression screen [facility] 2/9 08/14/2020 04/01/2020 10/23/2019 07/19/2019 04/05/2019  Decreased Interest 0 0 0 0 0  Down, Depressed, Hopeless 0 0 0 0 0  PHQ - 2 Score 0 0 0 0 0   No flowsheet data found.      Fall Risk  08/14/2020 04/01/2020 10/23/2019 07/19/2019 04/05/2019  Falls in the past year? 0 0 0 0 0  Number falls in past yr: 0 0 0 - 0  Injury with Fall? 0 0 0 - 0  Follow up Falls evaluation completed Falls evaluation completed Falls evaluation  completed Falls evaluation completed Falls evaluation completed   Immunization History  Administered Date(s) Administered   PFIZER(Purple Top)SARS-COV-2 Vaccination 11/16/2019, 12/12/2019, 06/07/2020, 12/24/2020   Tdap 07/09/2015    No results found.  Past Medical History:  Diagnosis Date   Arthritis    Chicken pox    Diabetes mellitus without complication ([place])    Gout    Hypertension    UTI (urinary tract infection)    No Known Allergies Past Surgical History:  Procedure Laterality Date   NO PAST SURGERIES      Family History  Problem Relation Age of Onset   Heart failure Mother    Alcohol abuse Father    Alcohol abuse Sister    COPD Sister    Liver cancer Sister    Alcohol abuse Sister    Nocturnal enuresis Sister    Kidney disease Maternal Grandmother    BRCA 1/2 Neg Hx    Social History   Social History Narrative   Marital status:  Single. 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   Safety:      -Wears a bicycle helmet riding a bike: Yes     -smoke alarm in the home:Yes     - Feels safe in their relationships: Yes          Allergies as of 03/25/2021   No Known Allergies      Medication List        Accurate as of March 25, 2021  6:07 PM. If you have any questions, ask your nurse or doctor.          STOP taking these medications    diphenhydrAMINE-zinc acetate cream Commonly known as: Benadryl Extra Strength Stopped by: Howard Pouch, DO   ibuprofen 800 MG tablet Commonly known as: ADVIL Stopped by: Howard Pouch, DO   naproxen 250 MG tablet Commonly known as: NAPROSYN Stopped by: Howard Pouch, DO       TAKE these medications    allopurinol 300 MG tablet Commonly known as: ZYLOPRIM Take 1 tablet (300 mg total) by mouth daily.   amLODipine 10 MG tablet Commonly known as: NORVASC Take 1 tablet (10 mg total) by mouth daily.   atorvastatin 40 MG tablet Commonly known as: LIPITOR Take 1.5 tablets (60 mg total) by mouth daily. What changed: how much to take Changed by: Howard Pouch, DO   baclofen 10 MG tablet Commonly known as: LIORESAL Take 1 tablet (10 mg total) by mouth 3 (three) times daily. Started by: Howard Pouch, DO   colchicine 0.6 MG tablet Take 1 tablet (0.6 mg total) by mouth 2 (two) times daily as needed.   Fish Oil 500 MG Caps Take by mouth.   JOINT HEALTH PO Take by mouth.   losartan 50 MG tablet Commonly known as: COZAAR Take 1 tablet (50 mg total) by mouth  daily. What changed:  medication strength how much to take Changed by: Howard Pouch, DO   multivitamin capsule Take 1 capsule by mouth daily.   predniSONE 20 MG tablet Commonly known as: DELTASONE 40 mg x3d, 20 mg x2d, 10 mg x2d Started by: Howard Pouch, DO   PROBIOTIC BLEND PO Take by mouth.   valACYclovir 1000 MG tablet Commonly known as: VALTREX 2 po at outbreak onset then repeat in 12h for each outbreak        All past medical history, surgical history, allergies, family history, immunizations andmedications were updated in the EMR today and reviewed  under the history and medication portions of their EMR.     ROS: 14 pt review of systems performed and negative (unless mentioned in an HPI)  Objective: BP (!) 143/77   Pulse 77   Temp 98.3 F (36.8 C) (Oral)   Ht _0  (1.803 m)   Wt (!) 402 lb (182.3 kg)   SpO2 97%   BMI 56.07 kg/m  Gen: Afebrile. No acute distress. Nontoxic in appearance, well-developed, well-nourished, very pleasant obese female. HENT: AT. [place]. no Cough on exam, no hoarseness on exam. Eyes:Pupils Equal Round Reactive to light, Extraocular movements intact,  Conjunctiva without redness, discharge or icterus. Neck/lymp/endocrine: Supple, no lymphadenopathy, no thyromegaly CV: RRR no murmur, no edema, +2/4 P posterior tibialis pulses.  Chest: CTAB, no wheeze, rhonchi or crackles.  Normal respiratory effort.  Good air movement. Skin: Warm and well-perfused. Skin intact. Neuro/Msk: Normal gait. PERLA. EOMi. Alert. Oriented x3.   Psych: Normal affect, dress and demeanor. Normal speech. Normal thought content and judgment.   Assessment/plan: Mckenzie [name] is a 65 y.o. female present for establishment of care/[facility] Essential hypertension/HLD Above goal.  Discussed with her her goal is <130/80 with her history of diabetes. Increase losartan to 50 mg daily. Continue atorvastatin-currently at 40 mg daily.  If labs indicate cause we will increase  atorvastatin with LDL goal<70. Low-sodium diet. Continue to exercise. - CBC w/Diff - Comp Met (CMET) - TSH -Lipids  Fever blister Valtrex as needed prescribed for her.  Hyperlipidemia associated with type 2 diabetes mellitus (HCC)/Morbid obesity (HCC)/BMI 50.0-59.9, adult ([place]) Diabetes has been diet controlled for her.  A1c today is 6.1 and looks great. LDL goal <70 Continue atorvastatin 40 mg daily, if labs indicate will alter dose. - Lipid panel - PNA series: We will offer next appointment. Flu shot: (recommneded yearly) Foot exam: UTD 08/14/2020 Eye exam: Due this month   Idiopathic chronic gout of left foot without tophus Continue allopurinol 300 mg daily Uric acid goal < 6 Struggles with low purine diet. - Uric acid  Acute pain of both shoulders Uncertain etiology.  Appears to have acute onset last night and is improving with OTC management. Possible mild arthritis flare She reports consecutive days of NSAIDs cause her to have rectal bleeding. -Prescribed short course of prednisone and baclofen If symptoms persist without improvement, follow-up in 2-4 weeks for further investigation of the cause.     Return in about 5 months (around 09/07/2021) for [place] (30 min).  Orders Placed This Encounter  Procedures   Uric acid   CBC w/Diff   Comp Met (CMET)   TSH   Lipid panel   POCT HgB A1C   Meds ordered this encounter  Medications   allopurinol (ZYLOPRIM) 300 MG tablet    Sig: Take 1 tablet (300 mg total) by mouth daily.    Dispense:  90 tablet    Refill:  3   amLODipine (NORVASC) 10 MG tablet    Sig: Take 1 tablet (10 mg total) by mouth daily.    Dispense:  90 tablet    Refill:  1   DISCONTD: atorvastatin (LIPITOR) 40 MG tablet    Sig: Take 1 tablet (40 mg total) by mouth daily.    Dispense:  90 tablet    Refill:  3   colchicine 0.6 MG tablet    Sig: Take 1 tablet (0.6 mg total) by mouth 2 (two) times daily as needed.    Dispense:  12 tablet    Refill:  5    losartan (COZAAR) 50 MG tablet    Sig: Take 1 tablet (50 mg total) by mouth daily.    Dispense:  90 tablet    Refill:  1   valACYclovir (VALTREX) 1000 MG tablet    Sig: 2 po at outbreak onset then repeat in 12h for each outbreak    Dispense:  20 tablet    Refill:  2   predniSONE (DELTASONE) 20 MG tablet    Sig: 40 mg x3d, 20 mg x2d, 10 mg x2d    Dispense:  9 tablet    Refill:  0   baclofen (LIORESAL) 10 MG tablet    Sig: Take 1 tablet (10 mg total) by mouth 3 (three) times daily.    Dispense:  30 each    Refill:  0    Referral Orders  No referral(s) requested today     Note is dictated utilizing voice recognition software. Although note has been proof read prior to signing, occasional typographical errors still can be missed. If any questions arise, please do not hesitate to call for verification.  Electronically signed by: Howard Pouch, DO [place]

## 2021-03-25 NOTE — Telephone Encounter (Signed)
Please call patient -Mckenzie [name], kidney and thyroid function are normal -Blood cell counts and electrolytes are normal -Cholesterol panel looks pretty good with an LDL of 97 and her triglycerides were normal.  Even though she is a diet-controlled diabetic, her LDL goal is lower at 70.  Therefore instead of taking 1 atorvastatin in the evening take 1.5.  I have changed her prescription to reflect this change.  Lastly, for her complaints of shoulder pain. I called in a short course of prednisone for her and baclofen which is a muscle relaxer.  Try to use a heating pad over the area that is causing her discomfort in her neck/shoulders. If symptoms continue and she is not seeing improvement, follow-up in 2-4 weeks so we can investigate further on potential clots.

## 2021-03-26 LAB — POCT GLYCOSYLATED HEMOGLOBIN (HGB A1C)
HbA1c POC (<> result, manual entry): 6.1 % (ref 4.0–5.6)
HbA1c, POC (controlled diabetic range): 6.1 % (ref 0.0–7.0)
HbA1c, POC (prediabetic range): 6.1 % (ref 5.7–6.4)
Hemoglobin A1C: 6.1 % — AB (ref 4.0–5.6)

## 2021-03-26 NOTE — Telephone Encounter (Signed)
Spoke with pt regarding labs and instructions.   

## 2021-03-26 NOTE — Addendum Note (Signed)
Addended by: Maxie Barb on: 03/26/2021 11:13 AM   Modules accepted: Orders

## 2021-09-11 ENCOUNTER — Other Ambulatory Visit: Payer: Self-pay

## 2021-09-15 ENCOUNTER — Ambulatory Visit (INDEPENDENT_AMBULATORY_CARE_PROVIDER_SITE_OTHER): Payer: Self-pay | Admitting: Family Medicine

## 2021-09-15 ENCOUNTER — Other Ambulatory Visit: Payer: Self-pay

## 2021-09-15 VITALS — BP 120/71 | HR 79 | Temp 98.1°F | Ht 71.0 in | Wt >= 6400 oz

## 2021-09-15 DIAGNOSIS — M1A0720 Idiopathic chronic gout, left ankle and foot, without tophus (tophi): Secondary | ICD-10-CM

## 2021-09-15 DIAGNOSIS — I10 Essential (primary) hypertension: Secondary | ICD-10-CM

## 2021-09-15 DIAGNOSIS — Z6841 Body Mass Index (BMI) 40.0 and over, adult: Secondary | ICD-10-CM

## 2021-09-15 DIAGNOSIS — E785 Hyperlipidemia, unspecified: Secondary | ICD-10-CM

## 2021-09-15 DIAGNOSIS — E1169 Type 2 diabetes mellitus with other specified complication: Secondary | ICD-10-CM

## 2021-09-15 LAB — POCT GLYCOSYLATED HEMOGLOBIN (HGB A1C)
HbA1c POC (<> result, manual entry): 6.8 % (ref 4.0–5.6)
HbA1c, POC (controlled diabetic range): 6.8 % (ref 0.0–7.0)
HbA1c, POC (prediabetic range): 6.8 % — AB (ref 5.7–6.4)
Hemoglobin A1C: 6.8 % — AB (ref 4.0–5.6)

## 2021-09-15 MED ORDER — AMLODIPINE BESYLATE 10 MG PO TABS: 10.0000 mg | ORAL_TABLET | Freq: Every day | ORAL | 1 refills | Status: DC

## 2021-09-15 MED ORDER — COLCHICINE 0.6 MG PO TABS: 0.6000 mg | ORAL_TABLET | Freq: Two times a day (BID) | ORAL | 5 refills | Status: DC | PRN

## 2021-09-15 MED ORDER — ATORVASTATIN CALCIUM 40 MG PO TABS: 40.0000 mg | ORAL_TABLET | Freq: Every day | ORAL | 3 refills | Status: DC

## 2021-09-15 MED ORDER — LOSARTAN POTASSIUM 50 MG PO TABS: 50.0000 mg | ORAL_TABLET | Freq: Every day | ORAL | 1 refills | Status: DC

## 2021-09-15 MED ORDER — ALLOPURINOL 300 MG PO TABS
300.0000 mg | ORAL_TABLET | Freq: Every day | ORAL | 3 refills | Status: DC
Start: 2021-09-15 — End: 2022-03-02

## 2021-09-15 MED ORDER — BACLOFEN 10 MG PO TABS: 10.0000 mg | ORAL_TABLET | Freq: Three times a day (TID) | ORAL | 0 refills | Status: DC

## 2021-09-15 MED ORDER — VALACYCLOVIR HCL 1 G PO TABS: ORAL_TABLET | ORAL | 2 refills | Status: DC

## 2021-09-15 NOTE — Progress Notes (Signed)
Patient ID: [name], female  DOB: 11-Oct-1955, 66 y.o.   MRN: [id number] Patient Care Team    Relationship Specialty Notifications Start End  Ma Hillock, DO PCP - General Family Medicine  03/25/21   Avel Sensor, MD Consulting Physician Obstetrics and Gynecology  03/30/19   Juanita Craver, MD Consulting Physician Gastroenterology  03/30/19   Stephannie Li, [place]  Ophthalmology  09/15/21     Chief Complaint  Patient presents with   Hypertension    [place]; pt is not fasting     Subjective: ADMIRE BUNNELL is a 66 y.o.  female present for [facility] All past medical history, surgical history, allergies, family history, immunizations, medications and social history were updated in the electronic medical record today. All recent labs, ED visits and hospitalizations within the last year were reviewed.  Essential hypertension/HLD/morbid obesity Pt reports compliance  with amlodipine 10 mg daily, Lipitor 40 mg daily, losartan 25 mg daily. Blood pressures ranges at home not routinely checked. Patient denies chest pain, shortness of breath, dizziness or lower extremity edema.  Pt does not take a daily baby ASA. Pt is  prescribed statin. RF: Hypertension, hyperlipidemia, diabetes, obesity, family history of heart disease  Chronic idiopathic gout involving toe of left foot without tophus Patient reports she has had to take allopurinol 300 mg daily to keep her gout controlled.  She is tolerating this medication. She uses colchicine with acute flares. She states with her gout flare she usually takes colchicine.  She states she knows she does not follow a gout friendly diet since she loves shellfish and asparagus.  Fever blister Patient reports she gets fever blisters sometimes for the past 2 years.  She likes to have the Valtrex on hand to use if needed.  Hyperlipidemia associated with type 2 diabetes mellitus ([place]) Pt reports he has been a diet-controlled diabetic for some time.  She has  not been prescribed medications.  She knows she would not be able to tolerate metformin if she did not need medication secondary to GI side effects.  Patient denies dizziness, hyperglycemic or hypoglycemic events. Patient denies numbness, tingling in the extremities or nonhealing wounds of feet.  She reports she tries to watch her diet and she exercises routinely.   Depression screen [facility] 2/9 09/15/2021 08/14/2020 04/01/2020 10/23/2019 07/19/2019  Decreased Interest 0 0 0 0 0  Down, Depressed, Hopeless 0 0 0 0 0  PHQ - 2 Score 0 0 0 0 0  Altered sleeping 0 - - - -  Tired, decreased energy 1 - - - -  Change in appetite 0 - - - -  Feeling bad or failure about yourself  0 - - - -  Trouble concentrating 0 - - - -  Moving slowly or fidgety/restless 0 - - - -  Suicidal thoughts 0 - - - -  PHQ-9 Score 1 - - - -   GAD 7 : Generalized Anxiety Score 09/15/2021  Nervous, Anxious, on Edge 0  Control/stop worrying 0  Worry too much - different things 0  Trouble relaxing 0  Restless 0  Easily annoyed or irritable 0  Afraid - awful might happen 0  Total GAD 7 Score 0        Fall Risk  08/14/2020 04/01/2020 10/23/2019 07/19/2019 04/05/2019  Falls in the past year? 0 0 0 0 0  Number falls in past yr: 0 0 0 - 0  Injury with Fall? 0 0 0 - 0  Follow up _0    Immunization History  Administered Date(s) Administered   PFIZER(Purple Top)SARS-COV-2 Vaccination 11/16/2019, 12/12/2019, 06/07/2020, 12/24/2020   Tdap 07/09/2015    No results found.  Past Medical History:  Diagnosis Date   Arthritis    Chicken pox    Diabetes mellitus without complication ([place])    Gout    Hypertension    UTI (urinary tract infection)    No Known Allergies Past Surgical History:  Procedure Laterality Date   NO PAST SURGERIES     Family History  Problem Relation Age of Onset   Heart  failure Mother    Alcohol abuse Father    Alcohol abuse Sister    COPD Sister    Liver cancer Sister    Alcohol abuse Sister    Nocturnal enuresis Sister    Kidney disease Maternal Grandmother    BRCA 1/2 Neg Hx    Social History   Social History Narrative   Marital status:  Single. 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   Safety:      -Wears a bicycle helmet riding a bike: Yes     -smoke alarm in the home:Yes     - Feels safe in their relationships: Yes          Allergies as of 09/15/2021   No Known Allergies      Medication List        Accurate as of September 15, 2021 11:59 PM. If you have any questions, ask your nurse or doctor.          STOP taking these medications    predniSONE 20 MG tablet Commonly known as: DELTASONE Stopped by: Howard Pouch, DO       TAKE these medications    allopurinol 300 MG tablet Commonly known as: ZYLOPRIM Take 1 tablet (300 mg total) by mouth daily.   amLODipine 10 MG tablet Commonly known as: NORVASC Take 1 tablet (10 mg total) by mouth daily.   atorvastatin 40 MG tablet Commonly known as: LIPITOR Take 1 tablet (40 mg total) by mouth daily. What changed: how much to take Changed by: Howard Pouch, DO   baclofen 10 MG tablet Commonly known as: LIORESAL Take 1 tablet (10 mg total) by mouth 3 (three) times daily.   colchicine 0.6 MG tablet Take 1 tablet (0.6 mg total) by mouth 2 (two) times daily as needed.   Fish Oil 500 MG Caps Take by mouth.   JOINT HEALTH PO Take by mouth.   losartan 50 MG tablet Commonly known as: COZAAR Take 1 tablet (50 mg total) by mouth daily.   multivitamin capsule Take 1 capsule by mouth daily.   PROBIOTIC BLEND PO Take by mouth.   valACYclovir 1000 MG tablet Commonly known as: VALTREX 2 po at outbreak onset then repeat in 12h for each outbreak        All past medical history, surgical history, allergies,  family history, immunizations andmedications were updated in the EMR today and reviewed under the history and medication portions of their EMR.     ROS: 14 pt review of systems performed and negative (unless mentioned in an HPI)  Objective: BP 120/71    Pulse 79    Temp 98.1 F (36.7 C) (Oral)    Ht _1  (1.803 m)    Wt (!) 414 lb (187.8 kg)  SpO2 95%    BMI 57.74 kg/m  Physical Exam Vitals and nursing note reviewed.  Constitutional:      General: She is not in acute distress.    Appearance: Normal appearance. She is not ill-appearing, toxic-appearing or diaphoretic.  HENT:     Head: Normocephalic and atraumatic.     Mouth/Throat:     Mouth: Mucous membranes are moist.  Eyes:     General: No scleral icterus.       Right eye: No discharge.        Left eye: No discharge.     Extraocular Movements: Extraocular movements intact.     Conjunctiva/sclera: Conjunctivae normal.     Pupils: Pupils are equal, round, and reactive to light.  Cardiovascular:     Rate and Rhythm: Normal rate and regular rhythm.  Pulmonary:     Effort: Pulmonary effort is normal. No respiratory distress.     Breath sounds: Normal breath sounds. No wheezing, rhonchi or rales.  Musculoskeletal:     Cervical back: Neck supple. No tenderness.  Lymphadenopathy:     Cervical: No cervical adenopathy.  Skin:    General: Skin is warm and dry.     Coloration: Skin is not jaundiced or pale.     Findings: No erythema or rash.  Neurological:     Mental Status: She is alert and oriented to person, place, and time. Mental status is at baseline.     Motor: No weakness.     Gait: Gait normal.  Psychiatric:        Mood and Affect: Mood normal.        Behavior: Behavior normal.        Thought Content: Thought content normal.        Judgment: Judgment normal.     Assessment/plan: ARTEMIS KOLLER is a 66 y.o. female present for [facility] Essential hypertension/HLD Stable.   goal is <130/80 with her history of  diabetes. continue losartan to 50 mg daily. continue atorvastatin 40 mg daily.  Higher doses made her sick. Could consider Zetia add on next appt (with labs next visit) LDL goal<70 if able. . Low-sodium diet. A1c 6.1>6.8 today.  Continue to exercise.  Fever blister Valtrex as needed prescribed for her.  Hyperlipidemia associated with type 2 diabetes mellitus (HCC)/Morbid obesity (HCC)/BMI 50.0-59.9, adult (HCC) Stable LDL goal <70 Continue atorvastatin 40 mg daily - PNA series: We will offer next appointment.(After 65) Flu shot: (recommneded yearly) Foot exam: UTD 09/2021 Eye exam: completed  2022 Labs due next appt  Idiopathic chronic gout of left foot without tophus Continue allopurinol 300 mg daily Uric acid goal < 6- at goal.  Struggles with low purine diet.  Back discomfort: Restart baclofen twice daily as needed Okay to take extra strength Tylenol prior to bed. Return in about 24 weeks (around 03/02/2022) for [facility] (30 min).  Orders Placed This Encounter  Procedures   POCT HgB A1C   Meds ordered this encounter  Medications   amLODipine (NORVASC) 10 MG tablet    Sig: Take 1 tablet (10 mg total) by mouth daily.    Dispense:  90 tablet    Refill:  1   colchicine 0.6 MG tablet    Sig: Take 1 tablet (0.6 mg total) by mouth 2 (two) times daily as needed.    Dispense:  12 tablet    Refill:  5   losartan (COZAAR) 50 MG tablet    Sig: Take 1 tablet (50 mg total) by mouth  daily.    Dispense:  90 tablet    Refill:  1   valACYclovir (VALTREX) 1000 MG tablet    Sig: 2 po at outbreak onset then repeat in 12h for each outbreak    Dispense:  20 tablet    Refill:  2   atorvastatin (LIPITOR) 40 MG tablet    Sig: Take 1 tablet (40 mg total) by mouth daily.    Dispense:  135 tablet    Refill:  3   allopurinol (ZYLOPRIM) 300 MG tablet    Sig: Take 1 tablet (300 mg total) by mouth daily.    Dispense:  90 tablet    Refill:  3   baclofen (LIORESAL) 10 MG tablet    Sig: Take 1  tablet (10 mg total) by mouth 3 (three) times daily.    Dispense:  30 each    Refill:  0    Referral Orders  No referral(s) requested today     Note is dictated utilizing voice recognition software. Although note has been proof read prior to signing, occasional typographical errors still can be missed. If any questions arise, please do not hesitate to call for verification.  Electronically signed by: Howard Pouch, DO [place]

## 2021-09-15 NOTE — Patient Instructions (Addendum)
°  Great to see you today.  Start tylenol extra strength or [phone] mg a night.   Baclofen also prescribed for short term muscle spasm.   Next appt end of June. We will collect labs that appt.

## 2021-11-24 ENCOUNTER — Encounter: Payer: Self-pay | Admitting: Family Medicine

## 2021-11-24 ENCOUNTER — Other Ambulatory Visit: Payer: Self-pay

## 2021-11-24 ENCOUNTER — Ambulatory Visit (INDEPENDENT_AMBULATORY_CARE_PROVIDER_SITE_OTHER): Payer: Self-pay | Admitting: Family Medicine

## 2021-11-24 VITALS — BP 162/74 | HR 73 | Temp 98.5°F | Ht 71.0 in | Wt >= 6400 oz

## 2021-11-24 DIAGNOSIS — M2550 Pain in unspecified joint: Secondary | ICD-10-CM

## 2021-11-24 DIAGNOSIS — M791 Myalgia, unspecified site: Secondary | ICD-10-CM

## 2021-11-24 LAB — SEDIMENTATION RATE: Sed Rate: 57 mm/hr — ABNORMAL HIGH (ref 0–30)

## 2021-11-24 MED ORDER — BACLOFEN 20 MG PO TABS: 20.0000 mg | ORAL_TABLET | Freq: Three times a day (TID) | ORAL | 5 refills | Status: DC

## 2021-11-24 MED ORDER — MELOXICAM 15 MG PO TABS: 15.0000 mg | ORAL_TABLET | Freq: Every day | ORAL | 5 refills | Status: DC

## 2021-11-24 NOTE — Progress Notes (Signed)
? ? ? ?This visit occurred during the SARS-CoV-2 public health emergency.  Safety protocols were in place, including screening questions prior to the visit, additional usage of staff PPE, and extensive cleaning of exam room while observing appropriate contact time as indicated for disinfecting solutions.  ? ? ?Mckenzie [name] , 1955-12-02, 66 y.o., female ?MRN: [id number] ?Patient Care Team  ?  Relationship Specialty Notifications Start End  ?Ma Hillock, DO PCP - General Family Medicine  03/25/21   ?Avel Sensor, MD Consulting Physician Obstetrics and Gynecology  03/30/19   ?Juanita Craver, MD Consulting Physician Gastroenterology  03/30/19   ?Stephannie Li, [place]  Ophthalmology  09/15/21   ? ? ?Chief Complaint  ?Patient presents with  ? Joint Pain  ?  Pt c/o multiple joint pain worsen at the shoulders and neck x 3 years; worsen in the 2 mos;   ? ?  ?Subjective: Pt presents for an OV with complaints of worsening joint  of 2 months, onset 3 years ago.  Associated symptoms include pain in the neck and shoulders (points to trap).  ?"Can not sleep at night", she thinks she would feel better to sleep. She feels it is muscle concerns. ?Pt is not allergic to nsaids.  ?Normal GFR ?No h/o GERD ?She not on a blood thinner.  ?No fhx inflammatory arthritis/Rheumatoid.  ?She is taking dual action advil everyday.  ?Chiropractor said everything is "aligned" over a year ago.  ?She has bought pillows and applied heat.  ? ?Depression screen [facility] 2/9 09/15/2021 08/14/2020 04/01/2020 10/23/2019 07/19/2019  ?Decreased Interest 0 0 0 0 0  ?Down, Depressed, Hopeless 0 0 0 0 0  ?PHQ - 2 Score 0 0 0 0 0  ?Altered sleeping 0 - - - -  ?Tired, decreased energy 1 - - - -  ?Change in appetite 0 - - - -  ?Feeling bad or failure about yourself  0 - - - -  ?Trouble concentrating 0 - - - -  ?Moving slowly or fidgety/restless 0 - - - -  ?Suicidal thoughts 0 - - - -  ?PHQ-9 Score 1 - - - -  ? ? ?No Known Allergies ?Social History  ? ?Social History  Narrative  ? Marital status:  Single. G0P0  ? 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  Speaking engagements.  ? Safety:   ?   -Wears a bicycle helmet riding a bike: Yes  ?   -smoke alarm in the home:Yes  ?   - Feels safe in their relationships: Yes  ?   ?   ? ?Past Medical History:  ?Diagnosis Date  ? Arthritis   ? Chicken pox   ? Diabetes mellitus without complication ([place])   ? Gout   ? Hypertension   ? UTI (urinary tract infection)   ? ?Past Surgical History:  ?Procedure Laterality Date  ? NO PAST SURGERIES    ? ?Family History  ?Problem Relation Age of Onset  ? Heart failure Mother   ? Alcohol abuse Father   ? Alcohol abuse Sister   ? COPD Sister   ? Liver cancer Sister   ? Alcohol abuse Sister   ? Nocturnal enuresis Sister   ? Kidney disease Maternal Grandmother   ? BRCA 1/2 Neg Hx   ? ?Allergies as of 11/24/2021   ?No Known Allergies ?  ? ?  ?Medication List  ?  ? ?  ? Accurate as of November 24, 2021  1:26 PM. If you have  any questions, ask your nurse or doctor.  ?  ?  ? ?  ? ?allopurinol 300 MG tablet ?Commonly known as: ZYLOPRIM ?Take 1 tablet (300 mg total) by mouth daily. ?  ?amLODipine 10 MG tablet ?Commonly known as: NORVASC ?Take 1 tablet (10 mg total) by mouth daily. ?  ?atorvastatin 40 MG tablet ?Commonly known as: LIPITOR ?Take 1 tablet (40 mg total) by mouth daily. ?  ?baclofen 20 MG tablet ?Commonly known as: LIORESAL ?Take 1 tablet (20 mg total) by mouth 3 (three) times daily. ?What changed:  ?medication strength ?how much to take ?Changed by: Howard Pouch, DO ?  ?colchicine 0.6 MG tablet ?Take 1 tablet (0.6 mg total) by mouth 2 (two) times daily as needed. ?  ?Fish Oil 500 MG Caps ?Take by mouth. ?  ?JOINT HEALTH PO ?Take by mouth. ?  ?losartan 50 MG tablet ?Commonly known as: COZAAR ?Take 1 tablet (50 mg total) by mouth daily. ?  ?meloxicam 15 MG tablet ?Commonly known as: MOBIC ?Take 1 tablet (15 mg total) by mouth daily. ?Started by: Howard Pouch, DO ?   ?multivitamin capsule ?Take 1 capsule by mouth daily. ?  ?PROBIOTIC BLEND PO ?Take by mouth. ?  ?valACYclovir 1000 MG tablet ?Commonly known as: VALTREX ?2 po at outbreak onset then repeat in 12h for each outbreak ?  ? ?  ? ? ?All past medical history, surgical history, allergies, family history, immunizations andmedications were updated in the EMR today and reviewed under the history and medication portions of their EMR.    ? ?ROS ?Negative, with the exception of above mentioned in HPI ? ? ?Objective:  ?BP (!) 162/74   Pulse 73   Temp 98.5 ?F (36.9 ?C) (Oral)   Ht _0  (1.803 m)   Wt (!) 412 lb (186.9 kg)   SpO2 98%   BMI 57.46 kg/m?  ?Body mass index is 57.46 kg/m?. ? ?Physical Exam ?Vitals and nursing note reviewed.  ?Constitutional:   ?   General: She is not in acute distress. ?   Appearance: Normal appearance. She is obese. She is not ill-appearing, toxic-appearing or diaphoretic.  ?HENT:  ?   Head: Normocephalic and atraumatic.  ?   Mouth/Throat:  ?   Mouth: Mucous membranes are moist.  ?Eyes:  ?   General: No scleral icterus.    ?   Right eye: No discharge.     ?   Left eye: No discharge.  ?   Extraocular Movements: Extraocular movements intact.  ?   Conjunctiva/sclera: Conjunctivae normal.  ?   Pupils: Pupils are equal, round, and reactive to light.  ?Musculoskeletal:  ?   Cervical back: Neck supple. Tenderness present. No rigidity.  ?   Comments: Cervical spine: no bone TTP. No step off. TTP over mid trap bilateral. Full ROM cervical spine w/ discomfort on right SB/rot. Discomfort with ROM b/I ABDuction of arms   ?Lymphadenopathy:  ?   Cervical: No cervical adenopathy.  ?Skin: ?   General: Skin is warm and dry.  ?   Coloration: Skin is not jaundiced or pale.  ?   Findings: No erythema or rash.  ?Neurological:  ?   Mental Status: She is alert and oriented to person, place, and time. Mental status is at baseline.  ?   Sensory: No sensory deficit.  ?   Motor: No weakness.  ?   Coordination:  Coordination normal.  ?   Gait: Gait normal.  ?Psychiatric:     ?  Mood and Affect: Mood normal.     ?   Behavior: Behavior normal.     ?   Thought Content: Thought content normal.     ?   Judgment: Judgment normal.  ? ? ?No results found. ?No results found. ?No results found for this or any previous visit (from the past 24 hour(s)). ? ?Assessment/Plan: ?Mckenzie [name] is a 66 y.o. 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  ?She would like to have arthritis work up today.  ?Xray not desired- feels "normal alignment per chiro.  ?Discussed baclofen for spasms- she would like to restart, she does not think she took this TID.  ?NSAID> mobic Qd with food for arthritis pain.  ?- Sedimentation rate ?- ANA, IFA Comprehensive Panel-([facility]) ?- Rheumatoid Factor ?- Cyclic citrul peptide antibody, IgG ([facility]) ?- meloxicam (MOBIC) 15 MG tablet; Take 1 tablet (15 mg total) by mouth daily.  Dispense: 30 tablet; Refill: 5 ?- baclofen (LIORESAL) 20 MG tablet; Take 1 tablet (20 mg total) by mouth 3 (three) times daily.  Dispense: 90 tablet; Refill: 5 ?PT would be encouraged if labs normal.  ? ?Reviewed expectations re: course of current medical issues. ?Discussed self-management of symptoms. ?Outlined signs and symptoms indicating need for more acute intervention. ?Patient verbalized understanding and all questions were answered. ?Patient received an After-Visit Summary. ? ? ? ?Orders Placed This Encounter  ?Procedures  ? Sedimentation rate  ? ANA, IFA Comprehensive Panel-([facility])  ? Rheumatoid Factor  ? Cyclic citrul peptide antibody, IgG ([facility])  ? ?Meds ordered this encounter  ?Medications  ? meloxicam (MOBIC) 15 MG tablet  ?  Sig: Take 1 tablet (15 mg total) by mouth daily.  ?  Dispense:  30 tablet  ?  Refill:  5  ? baclofen (LIORESAL) 20 MG tablet  ?  Sig: Take 1 tablet (20 mg total) by mouth 3 (three) times daily.  ?  Dispense:  90 tablet  ?  Refill:   5  ? ?Referral Orders  ?No referral(s) requested today  ? ? ? ?Note is dictated utilizing voice recognition software. Although note has been proof read prior to signing, occasional typographical errors still can be missed. If an

## 2021-11-26 LAB — ANA, IFA COMPREHENSIVE PANEL
Anti Nuclear Antibody (ANA): POSITIVE — AB
ENA SM Ab Ser-aCnc: <1 AI
SM/RNP: <1 AI
SSA (Ro) (ENA) Antibody, IgG: <1 AI
SSB (La) (ENA) Antibody, IgG: <1 AI
Scleroderma (Scl-70) (ENA) Antibody, IgG: <1 AI
ds DNA Ab: <1 IU/mL

## 2021-11-26 LAB — ANTI-NUCLEAR AB-TITER (ANA TITER)
ANA TITER: 1:80 {titer} — ABNORMAL HIGH
ANA Titer 1: 1:40 {titer} — ABNORMAL HIGH

## 2021-11-26 LAB — CYCLIC CITRUL PEPTIDE ANTIBODY, IGG: Cyclic Citrullin Peptide Ab: <16 UNITS

## 2021-11-26 LAB — RHEUMATOID FACTOR: Rheumatoid fact SerPl-aCnc: <14 IU/mL (ref <14)

## 2021-12-02 ENCOUNTER — Telehealth: Payer: Self-pay | Admitting: Family Medicine

## 2021-12-02 DIAGNOSIS — R700 Elevated erythrocyte sedimentation rate: Secondary | ICD-10-CM

## 2021-12-02 DIAGNOSIS — M2550 Pain in unspecified joint: Secondary | ICD-10-CM

## 2021-12-02 DIAGNOSIS — M791 Myalgia, unspecified site: Secondary | ICD-10-CM

## 2021-12-02 DIAGNOSIS — M25511 Pain in right shoulder: Secondary | ICD-10-CM

## 2021-12-02 DIAGNOSIS — R768 Other specified abnormal immunological findings in serum: Secondary | ICD-10-CM

## 2021-12-02 HISTORY — DX: Myalgia, unspecified site: M79.10

## 2021-12-02 HISTORY — DX: Pain in unspecified joint: M25.50

## 2021-12-02 NOTE — Telephone Encounter (Signed)
Please inform patient ?Her arthritic work-up was negative for CCP or RF which are rheumatoid work-ups.  Negative for Sjogren's, scleroderma and lupus. ? ?Her inflammatory marker/sed rate was mildly elevated. ?Her autoimmune panel had a positive ANA titer.  Both of these titers are rather low and can be early detection of an autoimmune disorder versus a normal variant result.  There are many people without autoimmune disorders that have a positive low titer for an ANA.  However she does have 2 low titers that are positive and she has an inflammatory marker that is positive.  Therefore, referring her to rheumatology would be beneficial. ?I have placed this referral for her, in the meantime continue the Mobic and the baclofen for symptoms. ? ?

## 2021-12-02 NOTE — Telephone Encounter (Signed)
Spoke with pt regarding labs and instructions.   

## 2021-12-24 ENCOUNTER — Other Ambulatory Visit: Payer: Self-pay | Admitting: Family Medicine

## 2021-12-24 DIAGNOSIS — M2550 Pain in unspecified joint: Secondary | ICD-10-CM

## 2021-12-24 DIAGNOSIS — M791 Myalgia, unspecified site: Secondary | ICD-10-CM

## 2021-12-28 ENCOUNTER — Other Ambulatory Visit: Payer: Self-pay | Admitting: Family Medicine

## 2021-12-28 DIAGNOSIS — Z1231 Encounter for screening mammogram for malignant neoplasm of breast: Secondary | ICD-10-CM

## 2022-01-08 LAB — HM DIABETES EYE EXAM

## 2022-02-18 ENCOUNTER — Ambulatory Visit
Admission: RE | Admit: 2022-02-18 | Discharge: 2022-02-18 | Disposition: A | Discharge status: Home / Self Care | Payer: No Typology Code available for payment source | Source: Ambulatory Visit | Attending: Family Medicine | Admitting: Family Medicine

## 2022-02-18 DIAGNOSIS — Z1231 Encounter for screening mammogram for malignant neoplasm of breast: Secondary | ICD-10-CM

## 2022-03-02 ENCOUNTER — Encounter: Payer: Self-pay | Admitting: Family Medicine

## 2022-03-02 ENCOUNTER — Ambulatory Visit (INDEPENDENT_AMBULATORY_CARE_PROVIDER_SITE_OTHER): Payer: Self-pay | Admitting: Family Medicine

## 2022-03-02 VITALS — BP 113/71 | HR 69 | Temp 97.5°F | Ht 71.0 in | Wt >= 6400 oz

## 2022-03-02 DIAGNOSIS — M791 Myalgia, unspecified site: Secondary | ICD-10-CM

## 2022-03-02 DIAGNOSIS — E785 Hyperlipidemia, unspecified: Secondary | ICD-10-CM

## 2022-03-02 DIAGNOSIS — Z1289 Encounter for screening for malignant neoplasm of other sites: Secondary | ICD-10-CM

## 2022-03-02 DIAGNOSIS — M2550 Pain in unspecified joint: Secondary | ICD-10-CM

## 2022-03-02 DIAGNOSIS — E1169 Type 2 diabetes mellitus with other specified complication: Secondary | ICD-10-CM

## 2022-03-02 DIAGNOSIS — I10 Essential (primary) hypertension: Secondary | ICD-10-CM

## 2022-03-02 MED ORDER — BACLOFEN 20 MG PO TABS: 20.0000 mg | ORAL_TABLET | Freq: Three times a day (TID) | ORAL | 5 refills | Status: DC

## 2022-03-02 MED ORDER — LOSARTAN POTASSIUM 50 MG PO TABS: 50.0000 mg | ORAL_TABLET | Freq: Every day | ORAL | 1 refills | Status: DC

## 2022-03-02 MED ORDER — DICLOFENAC SODIUM 75 MG PO TBEC: 75.0000 mg | DELAYED_RELEASE_TABLET | Freq: Two times a day (BID) | ORAL | 5 refills | Status: DC

## 2022-03-02 MED ORDER — COLCHICINE 0.6 MG PO TABS
0.6000 mg | ORAL_TABLET | Freq: Two times a day (BID) | ORAL | 5 refills | Status: DC | PRN
Start: 2022-03-02 — End: 2022-08-17

## 2022-03-02 MED ORDER — MELOXICAM 15 MG PO TABS: 15.0000 mg | ORAL_TABLET | Freq: Every day | ORAL | 5 refills | Status: DC

## 2022-03-02 MED ORDER — AMLODIPINE BESYLATE 10 MG PO TABS: 10.0000 mg | ORAL_TABLET | Freq: Every day | ORAL | 1 refills | Status: DC

## 2022-03-02 MED ORDER — ALLOPURINOL 300 MG PO TABS
300.0000 mg | ORAL_TABLET | Freq: Every day | ORAL | 3 refills | Status: DC
Start: 2022-03-02 — End: 2022-08-17

## 2022-03-02 MED ORDER — ATORVASTATIN CALCIUM 40 MG PO TABS: 40.0000 mg | ORAL_TABLET | Freq: Every day | ORAL | 3 refills | Status: DC

## 2022-03-02 MED ORDER — VALACYCLOVIR HCL 1 G PO TABS: ORAL_TABLET | ORAL | 2 refills | Status: DC

## 2022-03-03 ENCOUNTER — Other Ambulatory Visit: Payer: Self-pay | Admitting: Family Medicine

## 2022-03-03 ENCOUNTER — Telehealth: Payer: Self-pay | Admitting: Family Medicine

## 2022-03-03 LAB — COMPREHENSIVE METABOLIC PANEL
AG Ratio: 1.2 (calc) (ref 1.0–2.5)
ALT: 19 U/L (ref 6–29)
AST: 19 U/L (ref 10–35)
Albumin: 3.7 g/dL (ref 3.6–5.1)
Alkaline phosphatase (APISO): 88 U/L (ref 37–153)
BUN: 15 mg/dL (ref 7–25)
CO2: 26 mmol/L (ref 20–32)
Calcium: 9.4 mg/dL (ref 8.6–10.4)
Chloride: 105 mmol/L (ref 98–110)
Creat: 0.83 mg/dL (ref 0.50–1.05)
Globulin: 3.2 g/dL (calc) (ref 1.9–3.7)
Glucose, Bld: 105 mg/dL — ABNORMAL HIGH (ref 65–99)
Potassium: 4.4 mmol/L (ref 3.5–5.3)
Sodium: 145 mmol/L (ref 135–146)
Total Bilirubin: 0.5 mg/dL (ref 0.2–1.2)
Total Protein: 6.9 g/dL (ref 6.1–8.1)

## 2022-03-03 LAB — CBC WITH DIFFERENTIAL/PLATELET
Absolute Monocytes: 555 cells/uL (ref 200–950)
Basophils Absolute: 61 cells/uL (ref 0–200)
Basophils Relative: 0.8 %
Eosinophils Absolute: 357 cells/uL (ref 15–500)
Eosinophils Relative: 4.7 %
HCT: 37.5 % (ref 35.0–45.0)
Hemoglobin: 12.6 g/dL (ref 11.7–15.5)
Lymphs Abs: 2561 cells/uL (ref 850–3900)
MCH: 29.5 pg (ref 27.0–33.0)
MCHC: 33.6 g/dL (ref 32.0–36.0)
MCV: 87.8 fL (ref 80.0–100.0)
MPV: 11 fL (ref 7.5–12.5)
Monocytes Relative: 7.3 %
Neutro Abs: 4066 cells/uL (ref 1500–7800)
Neutrophils Relative %: 53.5 %
Platelets: 266 10*3/uL (ref 140–400)
RBC: 4.27 10*6/uL (ref 3.80–5.10)
RDW: 14.6 % (ref 11.0–15.0)
Total Lymphocyte: 33.7 %
WBC: 7.6 10*3/uL (ref 3.8–10.8)

## 2022-03-03 LAB — TSH: TSH: 2.23 mIU/L (ref 0.40–4.50)

## 2022-03-03 LAB — HEMOGLOBIN A1C
Hgb A1c MFr Bld: 7.1 % of total Hgb — ABNORMAL HIGH (ref <5.7)
Mean Plasma Glucose: 157 mg/dL
eAG (mmol/L): 8.7 mmol/L

## 2022-03-03 LAB — LDL CHOLESTEROL, DIRECT: Direct LDL: 75 mg/dL (ref <100)

## 2022-03-03 MED ORDER — RYBELSUS 3 MG PO TABS: 3.0000 mg | ORAL_TABLET | Freq: Every day | ORAL | 0 refills | Status: DC

## 2022-03-03 MED ORDER — RYBELSUS 7 MG PO TABS: 7.0000 mg | ORAL_TABLET | Freq: Every day | ORAL | 2 refills | Status: DC

## 2022-03-03 NOTE — Telephone Encounter (Signed)
Spoke with pt regarding labs and instructions.   

## 2022-03-03 NOTE — Telephone Encounter (Signed)
Please call patient Liver, kidney and thyroid function are normal Blood cell counts and electrolytes are normal  Cholesterol /LDL is great and at goal with ldl 75. Continue atorvastatin    Diabetes /A1c went up to 7.1 (was 6.8)> I called in a new medicine called rybelsus. This medication helps lower a1c and can help kick start weight loss when combined with a diabetic diet and exercise.   If covered by insurance-it is a tapering medication.  First 30 days, take 1 Rybelsus 3 mg tab daily.  Then second prescription would be for the Rybelsus 7 mg daily.  Recommend follow-up in 3 4 months since A1c is elevating and we are considering any medication.

## 2022-03-05 LAB — MICROALBUMIN / CREATININE URINE RATIO
Creatinine, Urine: 150 mg/dL (ref 20–275)
Microalb Creat Ratio: 53 mcg/mg creat — ABNORMAL HIGH (ref <30)
Microalb, Ur: 7.9 mg/dL

## 2022-04-23 NOTE — Progress Notes (Signed)
Office Visit Note  Patient: Mckenzie [name]             Date of Birth: 16-Oct-1955           MRN: [id number]             PCP: Ma Hillock, DO Referring: Ma Hillock, DO Visit Date: 05/06/2022 Occupation: _0 @  Subjective:  No chief complaint on file.   History of Present Illness: Mckenzie [name] is a 66 y.o. female consultation per request of Dr. Raoul Pitch.  According to the patient about 7 years ago she developed pain and swelling in her left foot while she was in [place] at the time she was diagnosed with gout and was treated with prednisone and Indocin.  She came back and started having frequent flares she was saying she was having flares almost every month.  Eventually she was started on colchicine and she made some dietary modifications which were helpful.  About 4 years ago she was started on allopurinol 100 mg a day and that was gradually increased to 300 mg a day.  She states she has not had a gout flare in the last 2 years since she has been taking allopurinol on a regular basis.  She does not consume any alcohol but she eats red meat and shellfish.  She takes colchicine only on the as needed basis.  She states for the last 7 months she has been experiencing discomfort in her bilateral shoulders.  She does cardio 3 times a week and lifts weights.  She notices discomfort in her shoulders when she is doing weights and also getting dressed.  She denies any history of joint swelling.  She states that about 3 weeks ago she felt a sudden click in her right knee joint and has been painful since then.  She has been having difficulty walking.  She has been doing some stretching exercises which help.  None of the other joints are painful.  She has been having some muscle pain in her right calf muscle since she had right knee discomfort.  She denies any history of oral ulcers, nasal ulcers, malar rash, photosensitivity, Raynaud's phenomenon or lymphadenopathy.  She roller skates.  She also  walks for exercise.  There is no family history of autoimmune disease.  She is gravida 0 and para 0.  No history of blood clots.   Activities of Daily Living:  Patient reports morning stiffness for all day. Patient Denies nocturnal pain.  Difficulty dressing/grooming: Reports Difficulty climbing stairs: Reports Difficulty getting out of chair: Reports Difficulty using hands for taps, buttons, cutlery, and/or writing: Denies  Review of Systems  Constitutional:  Negative for fatigue.  HENT:  Negative for mouth sores and mouth dryness.   Eyes:  Negative for dryness.  Respiratory:  Positive for shortness of breath.   Cardiovascular:  Negative for chest pain and palpitations.  Gastrointestinal:  Negative for blood in stool, constipation and diarrhea.  Endocrine: Negative for increased urination.  Genitourinary:  Negative for involuntary urination.  Musculoskeletal:  Positive for joint pain, joint pain, joint swelling, myalgias, morning stiffness, muscle tenderness and myalgias. Negative for gait problem and muscle weakness.  Skin:  Negative for color change, rash, hair loss and sensitivity to sunlight.  Allergic/Immunologic: Negative for susceptible to infections.  Neurological:  Negative for dizziness and headaches.  Hematological:  Negative for swollen glands.  Psychiatric/Behavioral:  Negative for depressed mood and sleep disturbance. The patient is not nervous/anxious.  PMFS History:  Patient Active Problem List   Diagnosis Date Noted   Positive ANA (antinuclear antibody) 12/02/2021   Myalgia 12/02/2021   Polyarthralgia 12/02/2021   Elevated sed rate 12/02/2021   BMI 50.0-59.9, adult ([place]) 03/25/2021   Acute pain of both shoulders 03/25/2021   Family history of malignant neoplasm of gastrointestinal tract 02/24/2021   Hyperlipidemia associated with type 2 diabetes mellitus ([place]) 08/14/2020   Idiopathic chronic gout of left foot without tophus 08/14/2020   Gout 03/05/2018    Morbid obesity ([place]) 06/18/2013   Essential hypertension 03/22/2013    Past Medical History:  Diagnosis Date   Arthritis    Chicken pox    Diabetes mellitus without complication ([place])    Gout    Hypertension    UTI (urinary tract infection)     Family History  Problem Relation Age of Onset   Heart failure Mother    Alcohol abuse Father    COPD Sister    Liver cancer Sister    Alcohol abuse Sister    Nocturnal enuresis Sister    Kidney disease Maternal Grandmother    BRCA 1/2 Neg Hx    Past Surgical History:  Procedure Laterality Date   NO PAST SURGERIES     Social History   Social History Narrative   Marital status:  Single. G0P0   Education/employment: Graduate school.[name]; full time; [place] work.  Speaking engagements.   Safety:      -Wears a bicycle helmet riding a bike: Yes     -smoke alarm in the home:Yes     - Feels safe in their relationships: Yes         Immunization History  Administered Date(s) Administered   PFIZER(Purple Top)SARS-COV-2 Vaccination 11/16/2019, 12/12/2019, 06/07/2020, 12/24/2020   Tdap 07/09/2015     Objective: Vital Signs: BP 134/81 (BP Location: Left Wrist, Patient Position: Sitting, Cuff Size: Large)   Pulse 72   Resp 19   Ht 6' (1.829 m)   Wt (!) 417 lb (189.1 kg) Comment: per patient, refused to weigh  BMI 56.56 kg/m    Physical Exam Vitals and nursing note reviewed.  Constitutional:      Appearance: She is well-developed.  HENT:     Head: Normocephalic and atraumatic.  Eyes:     Conjunctiva/sclera: Conjunctivae normal.  Cardiovascular:     Rate and Rhythm: Normal rate and regular rhythm.     Heart sounds: Normal heart sounds.  Pulmonary:     Effort: Pulmonary effort is normal.     Breath sounds: Normal breath sounds.  Abdominal:     General: Bowel sounds are normal.     Palpations: Abdomen is soft.  Musculoskeletal:     Cervical back: Normal range of motion.     Right lower leg: Edema present.     Left  lower leg: Edema present.     Comments: Mild pitting edema was noted over bilateral lower extremities.  Lymphadenopathy:     Cervical: No cervical adenopathy.  Skin:    General: Skin is warm and dry.     Capillary Refill: Capillary refill takes less than 2 seconds.  Neurological:     Mental Status: She is alert and oriented to person, place, and time.  Psychiatric:        Behavior: Behavior normal.      Musculoskeletal Exam: Cervical spine was in good range of motion.  Shoulder joints were in good range of motion with some discomfort.  Elbow joints and wrist joints  in good range of motion.  There was no MCP PIP or DIP thickening or swelling.  Hip joints with good range of motion.  Knee joints were in good range of motion with discomfort on range of motion of her right knee joint.  No warmth swelling or effusion was noted.  She had no tenderness over ankles or MTPs.  CDAI Exam: CDAI Score: -- Patient Global: --; Provider Global: -- Swollen: --; Tender: -- Joint Exam 05/06/2022   No joint exam has been documented for this visit   There is currently no information documented on the homunculus. Go to the Rheumatology activity and complete the homunculus joint exam.  Investigation: No additional findings.  Imaging: No results found.  Recent Labs: Lab Results  Component Value Date   WBC 7.6 03/02/2022   HGB 12.6 03/02/2022   PLT 266 03/02/2022   NA 145 03/02/2022   K 4.4 03/02/2022   CL 105 03/02/2022   CO2 26 03/02/2022   GLUCOSE 105 (H) 03/02/2022   BUN 15 03/02/2022   CREATININE 0.83 03/02/2022   BILITOT 0.5 03/02/2022   ALKPHOS 87 03/25/2021   AST 19 03/02/2022   ALT 19 03/02/2022   PROT 6.9 03/02/2022   ALBUMIN 4.0 03/25/2021   CALCIUM 9.4 03/02/2022   GFRAA 111 04/01/2020    Speciality Comments: No specialty comments available.  Procedures:  No procedures performed Allergies: Patient has no known allergies.   Assessment / Plan:     Visit Diagnoses: Acute  pain of right knee-patient states that she had sudden click in her right knee about 3 weeks ago since then she has been having pain and difficulty walking.  She was given Voltaren 75 mg p.o. twice daily by Dr. Raoul Pitch which is not helping much.  She continues to have a lot of discomfort walking.  She is traveling to [place] tomorrow and would like to have prednisone.  She states she takes prednisone only once a year for joint problems.  On my examination she had no warmth swelling or effusion.  I offered cortisone injection but she is self-pay and did not want x-rays or injections.  Per patient's request to prednisone taper was given a starting at 40 mg and taper by 10 mg every 3 days.  Side effects of prednisone use including weight gain, hypertension, diabetes, osteoporosis and increased risk of heart disease was discussed.  She was also advised not to take her Voltaren while she is on prednisone.  I am concerned about the clicking feeling she experienced in her knee and having difficulty walking now.  I will refer her to orthopedics for evaluation.  Handout on knee exercises was given.  Chronic pain of both shoulders-she is planes of a stiffness in her bilateral shoulders for at least 7 months.  She states that she lifts weights and it causes some discomfort in her shoulders.  She had good range of motion from examination without any point tenderness.  A handout on shoulder exercises was given.  Positive ANA (antinuclear antibody) - 11/24/21: ANA 1:40NH, 1:40NS, ESR 57, RF<14, anti-CCP<16, dsDNA<1, Scl-70-, Sm-, Sm/RNP-, Ro-, La-.  There is no history of fatigue, oral ulcers, nasal ulcers, malar rash, photosensitivity, Raynaud's phenomenon, inflammatory arthritis or lymphadenopathy.  Labs were reviewed.  ANA is low titer and not significant.  Test for rheumatoid arthritis were negative.  Idiopathic chronic gout of left foot without tophus-patient gives history of gout for 7 years.  She has been on  allopurinol for the last 4 to  5 years.  She states she has not had any gout flares in the last 2 years.  She does not drink alcohol but does not practice dietary modifications.  She takes colchicine only on as needed basis.  Elevated sed rate-her sed rate is elevated most likely related to obesity.  No synovitis was noted on the examination today.  Essential hypertension-blood pressure was normal today.  Hyperlipidemia associated with type 2 diabetes mellitus (HCC)-patient states that she is not diabetic.  She is not taking semaglutide.  She states her blood sugar always has been normal.  I reviewed her labs from April 02, 2021 glucose level was normal.  Increased risk of hyperglycemia with prednisone use was discussed.  Family history of lung cancer-Sister - Her sister passed away from metastatic lung cancer was a smoker per patient.  Orders: Orders Placed This Encounter  Procedures   AMB referral to orthopedics   Meds ordered this encounter  Medications   predniSONE (DELTASONE) 10 MG tablet    Sig: Take 4 tabs po qd x 3 days, 3 tabs po qd x 3 days, 2 tabs po qd x 3 days, 1 tab po qd x 3 days, 1/2 tab po qd x 3 days.    Dispense:  32 tablet    Refill:  0     Follow-Up Instructions: Return if symptoms worsen or fail to improve, for Gout, joint pain.   Bo Merino, MD  Note - This record has been created using Editor, commissioning.  Chart creation errors have been sought, but may not always  have been located. Such creation errors do not reflect on  the standard of medical care.

## 2022-05-06 ENCOUNTER — Ambulatory Visit: Payer: Self-pay | Attending: Rheumatology | Admitting: Rheumatology

## 2022-05-06 ENCOUNTER — Encounter: Payer: Self-pay | Admitting: Rheumatology

## 2022-05-06 VITALS — BP 134/81 | HR 72 | Resp 19 | Ht 72.0 in | Wt >= 6400 oz

## 2022-05-06 DIAGNOSIS — E1169 Type 2 diabetes mellitus with other specified complication: Secondary | ICD-10-CM

## 2022-05-06 DIAGNOSIS — Z801 Family history of malignant neoplasm of trachea, bronchus and lung: Secondary | ICD-10-CM

## 2022-05-06 DIAGNOSIS — R700 Elevated erythrocyte sedimentation rate: Secondary | ICD-10-CM

## 2022-05-06 DIAGNOSIS — M25511 Pain in right shoulder: Secondary | ICD-10-CM

## 2022-05-06 DIAGNOSIS — M791 Myalgia, unspecified site: Secondary | ICD-10-CM

## 2022-05-06 DIAGNOSIS — M1A0720 Idiopathic chronic gout, left ankle and foot, without tophus (tophi): Secondary | ICD-10-CM

## 2022-05-06 DIAGNOSIS — M25561 Pain in right knee: Secondary | ICD-10-CM

## 2022-05-06 DIAGNOSIS — R768 Other specified abnormal immunological findings in serum: Secondary | ICD-10-CM

## 2022-05-06 DIAGNOSIS — I10 Essential (primary) hypertension: Secondary | ICD-10-CM

## 2022-05-06 DIAGNOSIS — M25512 Pain in left shoulder: Secondary | ICD-10-CM

## 2022-05-06 DIAGNOSIS — E785 Hyperlipidemia, unspecified: Secondary | ICD-10-CM

## 2022-05-06 DIAGNOSIS — G8929 Other chronic pain: Secondary | ICD-10-CM

## 2022-05-06 DIAGNOSIS — M2550 Pain in unspecified joint: Secondary | ICD-10-CM

## 2022-05-06 DIAGNOSIS — Z800 Family history of malignant neoplasm of digestive organs: Secondary | ICD-10-CM

## 2022-05-06 MED ORDER — PREDNISONE 10 MG PO TABS: ORAL_TABLET | ORAL | 0 refills | Status: DC

## 2022-05-06 NOTE — Patient Instructions (Signed)
Exercises for Chronic Knee Pain Chronic knee pain is pain that lasts longer than 3 months. For most people with chronic knee pain, exercise and weight loss is an important part of treatment. Your health care provider may want you to focus on: Strengthening the muscles that support your knee. This can take pressure off your knee and lessen pain. Preventing knee stiffness. Maintaining or increasing how far you can move your knee. Losing weight (if this applies) to take pressure off your knee, decrease your risk for injury, and make it easier for you to exercise. Your health care provider will help you develop an exercise program that matches your needs and physical abilities. Below are simple, low-impact exercises you can do at home. Ask your health care provider or a physical therapist how often you should do your exercise program and how many times to repeat each exercise. General safety tips Follow these safety tips for exercising with chronic knee pain: Get your health care provider's approval before doing any exercises. Start slowly and stop any time an exercise causes pain. Do not exercise if your knee pain is flaring up. Warm up first. Stretching a cold muscle can cause an injury. Do 5-10 minutes of easy movement or light stretching before beginning your exercise routine. Do 5-10 minutes of low-impact activity (like walking or cycling) before starting strengthening exercises. Contact your health care provider any time you have pain during or after exercising. Exercise may cause discomfort but should not be painful. It is normal to be a little stiff or sore after exercising.  Stretching and range-of-motion exercises Front thigh stretch  Stand up straight and support your body by holding on to a chair or resting one hand on a wall. With your legs straight and close together, bend one knee to lift your heel up toward your buttocks. Using one hand for support, grab your ankle with your free  hand. Pull your foot up closer toward your buttocks to feel the stretch in front of your thigh. Hold the stretch for 30 seconds. Repeat __________ times. Complete this exercise __________ times a day. Back thigh stretch  Sit on the floor with your back straight and your legs out straight in front of you. Place the palms of your hands on the floor and slide them toward your feet as you bend at the hip. Try to touch your nose to your knees and feel the stretch in the back of your thighs. Hold for 30 seconds. Repeat __________ times. Complete this exercise __________ times a day. Calf stretch  Stand facing a wall. Place the palms of your hands flat against the wall, arms extended, and lean slightly against the wall. Get into a lunge position with one leg bent at the knee and the other leg stretched out straight behind you. Keep both feet facing the wall and increase the bend in your knee while keeping the heel of the other leg flat on the ground. You should feel the stretch in your calf. Hold for 30 seconds. Repeat __________ times. Complete this exercise __________ times a day. Strengthening exercises Straight leg lift Lie on your back with one knee bent and the other leg out straight. Slowly lift the straight leg without bending the knee. Lift until your foot is about 12 inches (30 cm) off the floor. Hold for 3-5 seconds and slowly lower your leg. Repeat __________ times. Complete this exercise __________ times a day. Single leg dip Stand between two chairs and put both hands on the   backs of the chairs for support. Extend one leg out straight with your body weight resting on the heel of the standing leg. Slowly bend your standing knee to dip your body to the level that is comfortable for you. Hold for 3-5 seconds. Repeat __________ times. Complete this exercise __________ times a day. Hamstring curls Stand straight, knees close together, facing the back of a chair. Hold on to the  back of a chair with both hands. Keep one leg straight. Bend the other knee while bringing the heel up toward the buttock until the knee is bent at a 90-degree angle (right angle). Hold for 3-5 seconds. Repeat __________ times. Complete this exercise __________ times a day. Wall squat Stand straight with your back, hips, and head against a wall. Step forward one foot at a time with your back still against the wall. Your feet should be 2 feet (61 cm) from the wall at shoulder width. Keeping your back, hips, and head against the wall, slide down the wall to as close of a sitting position as you can get. Hold for 5-10 seconds, then slowly slide back up. Repeat __________ times. Complete this exercise __________ times a day. Step-ups Step up with one foot onto a sturdy platform or stool that is about 6 inches (15 cm) high. Face sideways with one foot on the platform and one on the ground. Place all your weight on the platform foot and lift your body off the ground until your knee extends. Let your other leg hang free to the side. Hold for 3-5 seconds then slowly lower your weight down to the floor foot. Repeat __________ times. Complete this exercise __________ times a day. Contact a health care provider if: Your exercise causes pain. Your pain is worse after you exercise. Your pain prevents you from doing your exercises. This information is not intended to replace advice given to you by your health care provider. Make sure you discuss any questions you have with your health care provider. Document Revised: 12/27/2019 Document Reviewed: 08/20/2019 Elsevier Patient Education  2023 [employer] Shoulder Exercises Ask your health care provider which exercises are safe for you. Do exercises exactly as told by your health care provider and adjust them as directed. It is normal to feel mild stretching, pulling, tightness, or discomfort as you do these exercises. Stop right away if you feel sudden  pain or your pain gets worse. Do not begin these exercises until told by your health care provider. Stretching exercises External rotation and abduction This exercise is sometimes called corner stretch. The exercise rotates your arm outward (external rotation) and moves your arm out from your body (abduction). Stand in a doorway with one of your feet slightly in front of the other. This is called a staggered stance. If you cannot reach your forearms to the door frame, stand facing a corner of a room. Choose one of the following positions as told by your health care provider: Place your hands and forearms on the door frame above your head. Place your hands and forearms on the door frame at the height of your head. Place your hands on the door frame at the height of your elbows. Slowly move your weight onto your front foot until you feel a stretch across your chest and in the front of your shoulders. Keep your head and chest upright and keep your abdominal muscles tight. Hold for __________ seconds. To release the stretch, shift your weight to your back foot. Repeat __________ times.  Complete this exercise __________ times a day. Extension, standing  Stand and hold a broomstick, a cane, or a similar object behind your back. Your hands should be a little wider than shoulder-width apart. Your palms should face away from your back. Keeping your elbows straight and your shoulder muscles relaxed, move the stick away from your body until you feel a stretch in your shoulders (extension). Avoid shrugging your shoulders while you move the stick. Keep your shoulder blades tucked down toward the middle of your back. Hold for __________ seconds. Slowly return to the starting position. Repeat __________ times. Complete this exercise __________ times a day. Range-of-motion exercises Pendulum  Stand near a wall or a surface that you can hold onto for balance. Bend at the waist and let your left / right arm  hang straight down. Use your other arm to support you. Keep your back straight and do not lock your knees. Relax your left / right arm and shoulder muscles, and move your hips and your trunk so your left / right arm swings freely. Your arm should swing because of the motion of your body, not because you are using your arm or shoulder muscles. Keep moving your hips and trunk so your arm swings in the following directions, as told by your health care provider: Side to side. Forward and backward. In clockwise and counterclockwise circles. Continue each motion for __________ seconds, or for as long as told by your health care provider. Slowly return to the starting position. Repeat __________ times. Complete this exercise __________ times a day. Shoulder flexion, standing  Stand and hold a broomstick, a cane, or a similar object. Place your hands a little more than shoulder-width apart on the object. Your left / right hand should be palm-up, and your other hand should be palm-down. Keep your elbow straight and your shoulder muscles relaxed. Push the stick up with your healthy arm to raise your left / right arm in front of your body, and then over your head until you feel a stretch in your shoulder (flexion). Avoid shrugging your shoulder while you raise your arm. Keep your shoulder blade tucked down toward the middle of your back. Hold for __________ seconds. Slowly return to the starting position. Repeat __________ times. Complete this exercise __________ times a day. Shoulder abduction, standing  Stand and hold a broomstick, a cane, or a similar object. Place your hands a little more than shoulder-width apart on the object. Your left / right hand should be palm-up, and your other hand should be palm-down. Keep your elbow straight and your shoulder muscles relaxed. Push the object across your body toward your left / right side. Raise your left / right arm to the side of your body (abduction) until  you feel a stretch in your shoulder. Do not raise your arm above shoulder height unless your health care provider tells you to do that. If directed, raise your arm over your head. Avoid shrugging your shoulder while you raise your arm. Keep your shoulder blade tucked down toward the middle of your back. Hold for __________ seconds. Slowly return to the starting position. Repeat __________ times. Complete this exercise __________ times a day. Internal rotation  Place your left / right hand behind your back, palm-up. Use your other hand to dangle an exercise band, a broomstick, or a similar object over your shoulder. Grasp the band with your left / right hand so you are holding on to both ends. Gently pull up on the band  until you feel a stretch in the front of your left / right shoulder. The movement of your arm toward the center of your body is called internal rotation. Avoid shrugging your shoulder while you raise your arm. Keep your shoulder blade tucked down toward the middle of your back. Hold for __________ seconds. Release the stretch by letting go of the band and lowering your hands. Repeat __________ times. Complete this exercise __________ times a day. Strengthening exercises External rotation  Sit in a stable chair without armrests. Secure an exercise band to a stable object at elbow height on your left / right side. Place a soft object, such as a folded towel or a small pillow, between your left / right upper arm and your body to move your elbow about 4 inches (10 cm) away from your side. Hold the end of the exercise band so it is tight and there is no slack. Keeping your elbow pressed against the soft object, slowly move your forearm out, away from your abdomen (external rotation). Keep your body steady so only your forearm moves. Hold for __________ seconds. Slowly return to the starting position. Repeat __________ times. Complete this exercise __________ times a day. Shoulder  abduction  Sit in a stable chair without armrests, or stand up. Hold a __________ lb / kg weight in your left / right hand, or hold an exercise band with both hands. Start with your arms straight down and your left / right palm facing in, toward your body. Slowly lift your left / right hand out to your side (abduction). Do not lift your hand above shoulder height unless your health care provider tells you that this is safe. Keep your arms straight. Avoid shrugging your shoulder while you do this movement. Keep your shoulder blade tucked down toward the middle of your back. Hold for __________ seconds. Slowly lower your arm, and return to the starting position. Repeat __________ times. Complete this exercise __________ times a day. Shoulder extension  Sit in a stable chair without armrests, or stand up. Secure an exercise band to a stable object in front of you so it is at shoulder height. Hold one end of the exercise band in each hand. Straighten your elbows and lift your hands up to shoulder height. Squeeze your shoulder blades together as you pull your hands down to the sides of your thighs (extension). Stop when your hands are straight down by your sides. Do not let your hands go behind your body. Hold for __________ seconds. Slowly return to the starting position. Repeat __________ times. Complete this exercise __________ times a day. Shoulder row  Sit in a stable chair without armrests, or stand up. Secure an exercise band to a stable object in front of you so it is at chest height. Hold one end of the exercise band in each hand. Position your palms so that your thumbs are facing the ceiling (neutral position). Bend each of your elbows to a 90-degree angle (right angle) and keep your upper arms at your sides. Step back or move the chair back until the band is tight and there is no slack. Slowly pull your elbows back behind you. Hold for __________ seconds. Slowly return to the  starting position. Repeat __________ times. Complete this exercise __________ times a day. Shoulder press-ups  Sit in a stable chair that has armrests. Sit upright, with your feet flat on the floor. Put your hands on the armrests so your elbows are bent and your fingers are  pointing forward. Your hands should be about even with the sides of your body. Push down on the armrests and use your arms to lift yourself off the chair. Straighten your elbows and lift yourself up as much as you comfortably can. Move your shoulder blades down, and avoid letting your shoulders move up toward your ears. Keep your feet on the ground. As you get stronger, your feet should support less of your body weight as you lift yourself up. Hold for __________ seconds. Slowly lower yourself back into the chair. Repeat __________ times. Complete this exercise __________ times a day. Wall push-ups  Stand so you are facing a stable wall. Your feet should be about one arm-length away from the wall. Lean forward and place your palms on the wall at shoulder height. Keep your feet flat on the floor as you bend your elbows and lean forward toward the wall. Hold for __________ seconds. Straighten your elbows to push yourself back to the starting position. Repeat __________ times. Complete this exercise __________ times a day. This information is not intended to replace advice given to you by your health care provider. Make sure you discuss any questions you have with your health care provider. Document Revised: 10/13/2021 Document Reviewed: 10/13/2021 Elsevier Patient Education  2023 [employer].

## 2022-05-25 ENCOUNTER — Ambulatory Visit (INDEPENDENT_AMBULATORY_CARE_PROVIDER_SITE_OTHER): Payer: Self-pay | Admitting: Sports Medicine

## 2022-05-25 ENCOUNTER — Ambulatory Visit (INDEPENDENT_AMBULATORY_CARE_PROVIDER_SITE_OTHER): Payer: Self-pay

## 2022-05-25 ENCOUNTER — Encounter: Payer: Self-pay | Admitting: Sports Medicine

## 2022-05-25 VITALS — Ht 72.0 in | Wt >= 6400 oz

## 2022-05-25 DIAGNOSIS — M545 Low back pain, unspecified: Secondary | ICD-10-CM

## 2022-05-25 DIAGNOSIS — M170 Bilateral primary osteoarthritis of knee: Secondary | ICD-10-CM

## 2022-05-25 DIAGNOSIS — M25561 Pain in right knee: Secondary | ICD-10-CM

## 2022-05-25 DIAGNOSIS — Z6841 Body Mass Index (BMI) 40.0 and over, adult: Secondary | ICD-10-CM

## 2022-05-25 NOTE — Progress Notes (Signed)
Several months of pain; complaining that her R knee "popped" out a month ago. Also stating the L knee has "popped" as well. Has tried prednisone for this, did help while taking it. Taking diclofenac PRN

## 2022-05-25 NOTE — Patient Instructions (Signed)
Treatment for osteoarthritis and healthy joints/body:  -"Motion is lotion" --> we need to do this safely.  The 2 best forms of exercise especially for the knees are aquatic therapy in the pool as well as stationary bicycle -Maintain healthy weight loss: Aiming for 10% of total body weight reduction --> we did send a referral to weight management through [facility], encouraged her to reach out to them  -Supplements for you to take/try: Turmeric, [place]well[place], glucosamine sulfate-chondroitin, omega-3 fatty acids -Foods that have anti-inflammatory properties: Fish, soy, omega-3 oils such as extra virgin olive oil, cherries, broccoli, green tea, citrus fruits, red beans, garlic and nuts -Make sure you stay hydrated with water as this can help preserve the spaces between your joints

## 2022-05-25 NOTE — Progress Notes (Signed)
Mckenzie [name] - 66 y.o. female MRN [id number]  Date of birth: 1956-08-07  Office Visit Note: Visit Date: 05/25/2022 PCP: Ma Hillock, DO Referred by: Bo Merino, MD  Subjective: Chief Complaint  Patient presents with   Right Knee - Pain   HPI: Mckenzie [name] is a very pleasant 66 y.o. female who presents today for acute on chronic right knee pain, low back pain.  Patient states she has had some pain in the knees before, but nothing that stops her from doing her activities of daily living.  A little over a month ago she felt a pop in her right knee as she was climbing up onto her bed.  She denies any gross swelling or redness/bruising at that time, but does state the right knee feels slightly warm.  She has tried taking oral prednisone and is currently taking oral diclofenac twice a day, which does help improve her pain, but it is still present.  Now that the right knee has been flared up, her left knee also gives her issues and the lower back causes her pain as well.  The right knee is improving, but she would like guidance on how she can treat her knee and back pain.  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  She tries to remain active doing some weight like activity a few times a week.  She does note that she has been overweight since she was a child.  She states she was tested for rheumatoid arthritis in the past and this was found to be negative.  Pertinent ROS were reviewed with the patient and found to be negative unless otherwise specified above in HPI.   Assessment & Plan: Visit Diagnoses:  1. Acute pain of right knee   2. Bilateral primary osteoarthritis of knee   3. Bilateral low back pain without sciatica, unspecified chronicity   4. Class 3 severe obesity with serious comorbidity and body mass index (BMI) of 50.0 to 59.9 in adult, unspecified obesity type ([place])    Plan:  I had a good discussion with [facility] today regarding both her knee, back and body pain.  We did review x-rays today which show essentially bone-on-bone arthritis of her knees.  Her right knee is more bothersome today, although she would like to get to the root issue of her pain.  We did discuss the association of obesity specifically on knee osteoarthritis.  She is on NSAID therapy currently on the we discussed the limited use of this and the side effect profile with prolonged use.  Discussed with her she would benefit from formalized physical therapy and allow this to transition into home home exercises for her.  We will send a referral to aquatic-based therapy that we have here at [facility].  Also recommended stationary bicycle.  Discussed the importance of weight loss which will help offload the knees, her back and overall health.  Referral was sent to [facility] weight management program.  I would like to see how she does with these treatment modalities and have her follow-up after about 6 weeks of dedicated therapy.  She is not a candidate nor interested in joint replacement at this time.  We did discuss the role of a as needed corticosteroid injection in the future if her pain does get severe enough.  Follow-up: after 6 weeks of aquatic PT and therapy  Meds & Orders: No orders of the defined types were placed in this encounter.   Orders Placed This Encounter  Procedures   XR Knee 1-2 Views Right   Ambulatory referral to Physical Therapy   Amb Ref to Medical Weight Management     Procedures: No procedures performed      Clinical History: No specialty comments available.  She reports that she has never smoked. She has never been exposed to tobacco smoke. She has never used smokeless tobacco.  Recent Labs    09/15/21 1343 03/02/22 1401  HGBA1C 6.8*  6.8  6.8  6.8* 7.1*    Objective:   Vital Signs: Ht 6' (1.829 m)   Wt (!) 417 lb (189.1 kg)   BMI 56.56 kg/m   Physical Exam   Gen: Well-appearing, in no acute distress; non-toxic CV: Well-perfused. Warm.  Resp: Breathing unlabored on room air; no wheezing. Psych: Fluid speech in conversation; appropriate affect; normal thought process Neuro: Sensation intact throughout. No gross coordination deficits.   Ortho Exam -Evaluation of gait shows a slightly forward flexed posture, there is a mild antalgic gait with delayed swing of the right lower extremity.  Evaluation of the right knee shows a very small joint effusion with mild warmth without redness or erythema.  Range of motion from 3-125 degrees, left knee 0-130 degrees.  There is mild component of varus collapse of both knees.  No varus or valgus instability.  Neurovascular intact distally.  Sensation intact throughout bilateral lower extremities.  Imaging: XR Knee 1-2 Views Right  Result Date: 05/25/2022 X-rays of bilateral AP standing and right lateral films were ordered and reviewed by myself.  X-rays show essentially bone-on-bone arthritis of bilateral knees.  Right knee has end-stage lateral compartment OA, left knee with end-stage medial joint OA.  There is moderate to severe patellofemoral joint arthritis of the right knee as well.  There is sclerotic change and bony spurring throughout the femoral tibial knee joint.   Past Medical/Family/Surgical/Social History: Medications & Allergies reviewed per EMR, new medications updated. Patient Active Problem List   Diagnosis Date Noted   Positive ANA (antinuclear antibody) 12/02/2021   Myalgia 12/02/2021   Polyarthralgia 12/02/2021   Elevated sed rate 12/02/2021   BMI 50.0-59.9, adult ([place]) 03/25/2021   Acute pain of both shoulders 03/25/2021   Family history of malignant neoplasm of gastrointestinal tract 02/24/2021   Hyperlipidemia associated with type 2 diabetes mellitus ([place]) 08/14/2020   Idiopathic chronic gout of left foot without tophus 08/14/2020   Gout 03/05/2018   Morbid obesity ([place]) 06/18/2013    Essential hypertension 03/22/2013   Past Medical History:  Diagnosis Date   Arthritis    Chicken pox    Diabetes mellitus without complication ([place])    Gout    Hypertension    UTI (urinary tract infection)    Family History  Problem Relation Age of Onset   Heart failure Mother    Alcohol abuse Father    COPD Sister    Liver cancer Sister    Alcohol abuse Sister    Nocturnal enuresis Sister    Kidney disease Maternal Grandmother    BRCA 1/2 Neg Hx    Past Surgical History:  Procedure Laterality Date   NO PAST SURGERIES     Social History   Occupational History   Occupation: MISSION[employer]    Employer: [employer]  Tobacco Use   Smoking status: Never    Passive exposure: Never   Smokeless tobacco: Never  Vaping  Use   Vaping Use: Never used  Substance and Sexual Activity   Alcohol use: Yes    Comment: soically   Drug use: Not Currently   Sexual activity: Not Currently    Birth control/protection: Post-menopausal

## 2022-08-17 ENCOUNTER — Ambulatory Visit (INDEPENDENT_AMBULATORY_CARE_PROVIDER_SITE_OTHER): Payer: Self-pay | Admitting: Family Medicine

## 2022-08-17 ENCOUNTER — Encounter: Payer: Self-pay | Admitting: Family Medicine

## 2022-08-17 VITALS — BP 120/69 | HR 74 | Temp 98.2°F | Ht 72.0 in | Wt >= 6400 oz

## 2022-08-17 DIAGNOSIS — Z6841 Body Mass Index (BMI) 40.0 and over, adult: Secondary | ICD-10-CM

## 2022-08-17 DIAGNOSIS — Z23 Encounter for immunization: Secondary | ICD-10-CM

## 2022-08-17 DIAGNOSIS — E1169 Type 2 diabetes mellitus with other specified complication: Secondary | ICD-10-CM

## 2022-08-17 DIAGNOSIS — M1A0720 Idiopathic chronic gout, left ankle and foot, without tophus (tophi): Secondary | ICD-10-CM

## 2022-08-17 DIAGNOSIS — E785 Hyperlipidemia, unspecified: Secondary | ICD-10-CM

## 2022-08-17 DIAGNOSIS — I10 Essential (primary) hypertension: Secondary | ICD-10-CM

## 2022-08-17 LAB — POCT GLYCOSYLATED HEMOGLOBIN (HGB A1C)
HbA1c POC (<> result, manual entry): 6.9 % (ref 4.0–5.6)
HbA1c, POC (controlled diabetic range): 6.9 % (ref 0.0–7.0)
HbA1c, POC (prediabetic range): 6.9 % — AB (ref 5.7–6.4)
Hemoglobin A1C: 6.9 % — AB (ref 4.0–5.6)

## 2022-08-17 MED ORDER — DICLOFENAC SODIUM 75 MG PO TBEC: 75.0000 mg | DELAYED_RELEASE_TABLET | Freq: Two times a day (BID) | ORAL | 5 refills | Status: DC

## 2022-08-17 MED ORDER — GLIPIZIDE 5 MG PO TABS: 2.5000 mg | ORAL_TABLET | Freq: Every day | ORAL | 1 refills | Status: DC

## 2022-08-17 MED ORDER — BACLOFEN 20 MG PO TABS: 20.0000 mg | ORAL_TABLET | Freq: Three times a day (TID) | ORAL | 5 refills | Status: DC

## 2022-08-17 MED ORDER — AMLODIPINE BESYLATE 10 MG PO TABS: 10.0000 mg | ORAL_TABLET | Freq: Every day | ORAL | 1 refills | Status: DC

## 2022-08-17 MED ORDER — ALLOPURINOL 300 MG PO TABS: 300.0000 mg | ORAL_TABLET | Freq: Every day | ORAL | 3 refills | Status: DC

## 2022-08-17 MED ORDER — COLCHICINE 0.6 MG PO TABS: 0.6000 mg | ORAL_TABLET | Freq: Two times a day (BID) | ORAL | 5 refills | Status: DC | PRN

## 2022-08-17 MED ORDER — ATORVASTATIN CALCIUM 40 MG PO TABS: 40.0000 mg | ORAL_TABLET | Freq: Every day | ORAL | 3 refills | Status: DC

## 2022-08-17 MED ORDER — PREDNISONE 20 MG PO TABS: ORAL_TABLET | ORAL | 0 refills | Status: DC

## 2022-08-17 MED ORDER — VALACYCLOVIR HCL 1 G PO TABS: ORAL_TABLET | ORAL | 2 refills | Status: DC

## 2022-08-17 MED ORDER — LOSARTAN POTASSIUM 50 MG PO TABS: 50.0000 mg | ORAL_TABLET | Freq: Every day | ORAL | 1 refills | Status: DC

## 2022-08-17 NOTE — Progress Notes (Signed)
Patient ID: Mckenzie [name], female  DOB: 1956/02/06, 66 y.o.   MRN: [id number] Patient Care Team    Relationship Specialty Notifications Start End  Mckenzie Hillock, DO PCP - General Family Medicine  03/25/21   Mckenzie Sensor, MD Consulting Physician Obstetrics and Gynecology  03/30/19   Mckenzie Craver, MD Consulting Physician Gastroenterology  03/30/19   Mckenzie [name], [place]  Ophthalmology  09/15/21     Chief Complaint  Patient presents with   Hypertension    Cmc; pt is not fasting    Subjective: Mckenzie [name] is a 66 y.o.  female present for [facility] [name] past medical history, surgical history, allergies, family history, immunizations, medications and social history were updated in the electronic medical record today. [name] recent labs, ED visits and hospitalizations within the last year were reviewed.  Essential hypertension/HLD/morbid obesity Pt reports compliance with amlodipine 10 mg daily, Lipitor 40 mg daily, losartan 50 mg daily. Patient denies chest pain, shortness of breath, dizziness or lower extremity edema.  Pt does not take a daily baby ASA. Pt is  prescribed statin. RF: Hypertension, hyperlipidemia, diabetes, obesity, family history of heart disease  Chronic idiopathic gout involving toe of left foot without tophus Patient reports she has had to take allopurinol 300 mg daily to keep her gout controlled.   Patient reports compliance with allopurinol 300 mg daily.  She uses colchicine with acute flares.  She states she knows she does not follow a gout friendly diet since she loves shellfish and asparagus.  Fever blister Patient reports she gets fever blisters sometimes for the past 2 years.  She likes to have the Valtrex on hand to use if needed.  Hyperlipidemia associated with type 2 diabetes mellitus (Mckenzie [name]) Pt reports he has been a diet-controlled diabetic for some time.  He was prescribed Rybelsus taper last visit and discontinued after 3 months secondary to cost of  Rybelsus.  She knows she would not be able to tolerate metformin if she did  need medication secondary to GI side effects. Patient denies dizziness, hyperglycemic or hypoglycemic events. Patient denies numbness, tingling in the extremities or nonhealing wounds of feet.       08/17/2022    1:15 PM 09/15/2021    1:49 PM 08/14/2020   11:32 AM 04/01/2020   11:19 AM 10/23/2019    1:44 PM  Depression screen PHQ 2/9  Decreased Interest 0 0 0 0 0  Down, Depressed, Hopeless 0 0 0 0 0  PHQ - 2 Score 0 0 0 0 0  Altered sleeping  0     Tired, decreased energy  1     Change in appetite  0     Feeling bad or failure about yourself   0     Trouble concentrating  0     Moving slowly or fidgety/restless  0     Suicidal thoughts  0     PHQ-9 Score  1         09/15/2021    1:49 PM  GAD 7 : Generalized Anxiety Score  Nervous, Anxious, on Edge 0  Control/stop worrying 0  Worry too much - different things 0  Trouble relaxing 0  Restless 0  Easily annoyed or irritable 0  Afraid - awful might happen 0  Total GAD 7 Score 0          08/16/2022   10:30 PM 08/14/2020   11:32 AM 04/01/2020   11:19 AM 10/23/2019  1:44 PM 07/19/2019    1:32 PM  Fall Risk   Falls in the past year? 0 0 0 0 0  Number falls in past yr:  0 0 0   Injury with Fall?  0 0 0   Follow up  Falls evaluation completed Falls evaluation completed Falls evaluation completed Falls evaluation completed   Immunization History  Administered Date(s) Administered   COVID-19, mRNA, vaccine(Comirnaty)12 years and older 08/06/2022   PFIZER(Purple Top)SARS-COV-2 Vaccination 11/16/2019, 12/12/2019, 06/07/2020, 12/24/2020   PNEUMOCOCCAL CONJUGATE-20 08/17/2022   Tdap 07/09/2015   Zoster Recombinat (Shingrix) 03/04/2022    No results found.  Past Medical History:  Diagnosis Date   Arthritis    Chicken pox    Diabetes mellitus without complication (HCC)    Gout    Hypertension    UTI (urinary tract infection)    No Known  Allergies Past Surgical History:  Procedure Laterality Date   NO PAST SURGERIES     Family History  Problem Relation Age of Onset   Heart failure Mother    Alcohol abuse Father    COPD Sister    Liver cancer Sister    Alcohol abuse Sister    Nocturnal enuresis Sister    Kidney disease Maternal Grandmother    BRCA 1/2 Neg Hx    Social History   Social History Narrative   Marital status:  Single. G0P0   Education/employment: Graduate school.Mckenzie [name]; full time; Mckenzie [name] work.  Speaking engagements.   Safety:      -Wears a bicycle helmet riding a bike: Yes     -smoke alarm in the home:Yes     - Feels safe in their relationships: Yes          Allergies as of 08/17/2022   No Known Allergies      Medication List        Accurate as of August 17, 2022  2:31 PM. If you have any questions, ask your nurse or doctor.          STOP taking these medications    Rybelsus 3 MG Tabs Generic drug: Semaglutide Stopped by: Mckenzie Pouch, DO   Rybelsus 7 MG Tabs Generic drug: Semaglutide Stopped by: Mckenzie Pouch, DO       TAKE these medications    allopurinol 300 MG tablet Commonly known as: ZYLOPRIM Take 1 tablet (300 mg total) by mouth daily.   amLODipine 10 MG tablet Commonly known as: NORVASC Take 1 tablet (10 mg total) by mouth daily.   atorvastatin 40 MG tablet Commonly known as: LIPITOR Take 1 tablet (40 mg total) by mouth daily.   baclofen 20 MG tablet Commonly known as: LIORESAL Take 1 tablet (20 mg total) by mouth 3 (three) times daily.   colchicine 0.6 MG tablet Take 1 tablet (0.6 mg total) by mouth 2 (two) times daily as needed.   diclofenac 75 MG EC tablet Commonly known as: VOLTAREN Take 1 tablet (75 mg total) by mouth 2 (two) times daily.   Fish Oil 500 MG Caps Take by mouth.   glipiZIDE 5 MG tablet Commonly known as: GLUCOTROL Take 0.5 tablets (2.5 mg total) by mouth daily before breakfast. Started by: Mckenzie Pouch, DO   JOINT  HEALTH PO Take by mouth.   losartan 50 MG tablet Commonly known as: COZAAR Take 1 tablet (50 mg total) by mouth daily.   multivitamin capsule Take 1 capsule by mouth daily.   predniSONE 20 MG tablet Commonly known as: DELTASONE 40 mg x3d,  20 mg x2d, 10 mg x2d What changed:  medication strength additional instructions Changed by: Mckenzie Pouch, DO   PROBIOTIC BLEND PO Take by mouth.   valACYclovir 1000 MG tablet Commonly known as: VALTREX 2 po at outbreak onset then repeat in 12h for each outbreak        [name] past medical history, surgical history, allergies, family history, immunizations andmedications were updated in the EMR today and reviewed under the history and medication portions of their EMR.     ROS: 14 pt review of systems performed and negative (unless mentioned in an HPI)  Objective: BP 120/69   Pulse 74   Temp 98.2 F (36.8 C) (Oral)   Ht 6' (1.829 m)   Wt (!) 417 lb (189.1 kg)   SpO2 96%   BMI 56.56 kg/m  Physical Exam Vitals and nursing note reviewed.  Constitutional:      General: She is not in acute distress.    Appearance: Normal appearance. She is not ill-appearing, toxic-appearing or diaphoretic.  HENT:     Head: Normocephalic and atraumatic.  Eyes:     General: No scleral icterus.       Right eye: No discharge.        Left eye: No discharge.     Extraocular Movements: Extraocular movements intact.     Conjunctiva/sclera: Conjunctivae normal.     Pupils: Pupils are equal, round, and reactive to light.  Cardiovascular:     Rate and Rhythm: Normal rate and regular rhythm.  Pulmonary:     Effort: Pulmonary effort is normal. No respiratory distress.     Breath sounds: Normal breath sounds. No wheezing, rhonchi or rales.  Musculoskeletal:     Cervical back: Neck supple. No tenderness.     Right lower leg: No edema.     Left lower leg: No edema.  Lymphadenopathy:     Cervical: No cervical adenopathy.  Skin:    General: Skin is warm and  dry.     Coloration: Skin is not jaundiced or pale.     Findings: No erythema or rash.  Neurological:     Mental Status: She is alert and oriented to person, place, and time. Mental status is at baseline.     Motor: No weakness.     Gait: Gait normal.  Psychiatric:        Mood and Affect: Mood normal.        Behavior: Behavior normal.        Thought Content: Thought content normal.        Judgment: Judgment normal.    Diabetic Foot Exam - Simple   Simple Foot Form Diabetic Foot exam was performed with the following findings: Yes 08/17/2022  2:30 PM  Visual Inspection Sensation Testing Intact to touch and monofilament testing bilaterally: Yes Pulse Check Posterior Tibialis and Dorsalis pulse intact bilaterally: Yes Comments      Assessment/plan: Mckenzie [name] is a 66 y.o. female present for [facility] Essential hypertension/HLD Stable goal is <130/80 with her history of diabetes. Continue losartan to 50 mg daily. Continue atorvastatin 40 mg daily.  Higher doses made her sick. Could consider Zetia add on if needed. LDL goal<70 if able. . Low-sodium diet. Continue to exercise.  Fever blister Valtrex as needed prescribed for her.  Hyperlipidemia associated with type 2 diabetes mellitus (HCC)/Morbid obesity (HCC)/BMI 50.0-59.9, adult (HCC) Start glipizide 2.5 QD LDL goal <70 Continue atorvastatin 40 mg daily - PNA series: PNA20.(After 65) Flu shot: Declined (recommneded yearly)  Foot exam: UTD 09/2021 Eye exam: completed 01/2022 A1c 6.1>6.8 >7.1> 6.9 collected today.   Idiopathic chronic gout of left foot without tophus Stable Continue allopurinol 300 mg daily Uric acid goal < 6- at goal.  Struggles with low purine diet.  Back discomfort: Stable Continue baclofen twice daily as needed.  If desiring to taper down she is to taper to half a tab twice daily and then taper off slowly Continue diclofenac BID   Return in about 24 weeks (around 02/01/2023) for Routine chronic  condition follow-up.  Orders Placed This Encounter  Procedures   Pneumococcal conjugate vaccine 20-valent (Prevnar 20)   POCT HgB A1C   Meds ordered this encounter  Medications   valACYclovir (VALTREX) 1000 MG tablet    Sig: 2 po at outbreak onset then repeat in 12h for each outbreak    Dispense:  20 tablet    Refill:  2   losartan (COZAAR) 50 MG tablet    Sig: Take 1 tablet (50 mg total) by mouth daily.    Dispense:  90 tablet    Refill:  1   diclofenac (VOLTAREN) 75 MG EC tablet    Sig: Take 1 tablet (75 mg total) by mouth 2 (two) times daily.    Dispense:  60 tablet    Refill:  5    Dc mobic please   colchicine 0.6 MG tablet    Sig: Take 1 tablet (0.6 mg total) by mouth 2 (two) times daily as needed.    Dispense:  12 tablet    Refill:  5   baclofen (LIORESAL) 20 MG tablet    Sig: Take 1 tablet (20 mg total) by mouth 3 (three) times daily.    Dispense:  90 tablet    Refill:  5   atorvastatin (LIPITOR) 40 MG tablet    Sig: Take 1 tablet (40 mg total) by mouth daily.    Dispense:  90 tablet    Refill:  3   amLODipine (NORVASC) 10 MG tablet    Sig: Take 1 tablet (10 mg total) by mouth daily.    Dispense:  90 tablet    Refill:  1   allopurinol (ZYLOPRIM) 300 MG tablet    Sig: Take 1 tablet (300 mg total) by mouth daily.    Dispense:  90 tablet    Refill:  3   glipiZIDE (GLUCOTROL) 5 MG tablet    Sig: Take 0.5 tablets (2.5 mg total) by mouth daily before breakfast.    Dispense:  45 tablet    Refill:  1   predniSONE (DELTASONE) 20 MG tablet    Sig: 40 mg x3d, 20 mg x2d, 10 mg x2d    Dispense:  9 tablet    Refill:  0    Referral Orders  No referral(s) requested today      Note is dictated utilizing voice recognition software. Although note has been proof read prior to signing, occasional typographical errors still can be missed. If any questions arise, please do not hesitate to call for verification.  Electronically signed by: Mckenzie Pouch, DO [place]

## 2022-08-17 NOTE — Patient Instructions (Addendum)
Return in about 24 weeks (around 02/01/2023) for Routine chronic condition follow-up.        Great to see you today.  I have refilled the medication(s) we provide.   If labs were collected, we will inform you of lab results once received either by echart message or telephone call.   - echart message- for normal results that have been seen by the patient already.   - telephone call: abnormal results or if patient has not viewed results in their echart.  

## 2022-10-02 ENCOUNTER — Other Ambulatory Visit: Payer: Self-pay | Admitting: Family Medicine

## 2022-11-26 ENCOUNTER — Ambulatory Visit (INDEPENDENT_AMBULATORY_CARE_PROVIDER_SITE_OTHER): Payer: Self-pay | Admitting: Family Medicine

## 2022-11-26 ENCOUNTER — Encounter: Payer: Self-pay | Admitting: Family Medicine

## 2022-11-26 VITALS — BP 128/78 | HR 70 | Temp 98.1°F | Wt >= 6400 oz

## 2022-11-26 DIAGNOSIS — G8929 Other chronic pain: Secondary | ICD-10-CM

## 2022-11-26 DIAGNOSIS — M1991 Primary osteoarthritis, unspecified site: Secondary | ICD-10-CM

## 2022-11-26 DIAGNOSIS — M25512 Pain in left shoulder: Secondary | ICD-10-CM

## 2022-11-26 DIAGNOSIS — M25511 Pain in right shoulder: Secondary | ICD-10-CM

## 2022-11-26 MED ORDER — TRAMADOL HCL 50 MG PO TABS: 50.0000 mg | ORAL_TABLET | Freq: Every day | ORAL | 5 refills | Status: DC

## 2022-11-26 MED ORDER — PREDNISONE 20 MG PO TABS: ORAL_TABLET | ORAL | 0 refills | Status: DC

## 2022-11-26 NOTE — Progress Notes (Signed)
Mckenzie [name] , Aug 20, 1956, 67 y.o., female MRN: [id number] Patient Care Team    Relationship Specialty Notifications Start End  Ma Hillock, DO PCP - General Family Medicine  03/25/21   Avel Sensor, MD Consulting Physician Obstetrics and Gynecology  03/30/19   Juanita Craver, MD Consulting Physician Gastroenterology  03/30/19   Stephannie Li, [place]  Ophthalmology  09/15/21     Chief Complaint  Patient presents with   Shoulder Pain    Both; over 2 months; finger nail discoloration notice a few months ago not sure if correlated     Subjective: Mckenzie [name] is a 67 y.o. Pt presents for an OV with complaints of difficulty staying asleep secondary to bilateral shoulder pain.  She reports the shoulder pain has been worsening over the last 2 months.  She has been unable to sleep in her bed secondary to discomfort.  She states she is sleeping enough that she falls asleep but as soon as she tries to move or get comfortable, usually about an hour into her sleep, she wakes secondary to pain. She did have a positive ANA that was a very low titer with a negative rheumatoid factor and negative CCP.  Reflex panel was normal.  Sed rate had been elevated to 57.  She has a prednisone prescription in pocket for flares, and she is worried about using it because she has 2 very important conferences throughout the year that she needs to make sure she can get to, and she is fearful she will need to prednisone during those conferences. She also reports she is not being as active as she used to be secondary to pain.     08/17/2022    1:15 PM 09/15/2021    1:49 PM 08/14/2020   11:32 AM 04/01/2020   11:19 AM 10/23/2019    1:44 PM  Depression screen PHQ 2/9  Decreased Interest 0 0 0 0 0  Down, Depressed, Hopeless 0 0 0 0 0  PHQ - 2 Score 0 0 0 0 0  Altered sleeping  0     Tired, decreased energy  1     Change in appetite  0     Feeling bad or failure about yourself   0     Trouble  concentrating  0     Moving slowly or fidgety/restless  0     Suicidal thoughts  0     PHQ-9 Score  1       No Known Allergies Social History   Social History Narrative   Marital status:  Single. 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   Safety:      -Wears a bicycle helmet riding a bike: Yes     -smoke alarm in the home:Yes     - Feels safe in their relationships: Yes         Past Medical History:  Diagnosis Date   Arthritis    Chicken pox    Diabetes mellitus without complication (HCC)    Gout    Hypertension    UTI (urinary tract infection)    Past Surgical History:  Procedure Laterality Date   NO PAST SURGERIES     Family History  Problem Relation Age of Onset   Heart failure Mother    Alcohol abuse Father    COPD Sister    Liver cancer Sister    Alcohol abuse Sister  Nocturnal enuresis Sister    Kidney disease Maternal Grandmother    BRCA 1/2 Neg Hx    Allergies as of 11/26/2022   No Known Allergies      Medication List        Accurate as of November 26, 2022  2:36 PM. If you have any questions, ask your nurse or doctor.          allopurinol 300 MG tablet Commonly known as: ZYLOPRIM Take 1 tablet (300 mg total) by mouth daily.   amLODipine 10 MG tablet Commonly known as: NORVASC Take 1 tablet (10 mg total) by mouth daily.   atorvastatin 40 MG tablet Commonly known as: LIPITOR Take 1 tablet (40 mg total) by mouth daily.   baclofen 20 MG tablet Commonly known as: LIORESAL Take 1 tablet (20 mg total) by mouth 3 (three) times daily.   colchicine 0.6 MG tablet Take 1 tablet (0.6 mg total) by mouth 2 (two) times daily as needed.   diclofenac 75 MG EC tablet Commonly known as: VOLTAREN Take 1 tablet (75 mg total) by mouth 2 (two) times daily.   Fish Oil 500 MG Caps Take by mouth.   glipiZIDE 5 MG tablet Commonly known as: GLUCOTROL Take 0.5 tablets (2.5 mg total) by mouth  daily before breakfast.   JOINT HEALTH PO Take by mouth.   losartan 50 MG tablet Commonly known as: COZAAR Take 1 tablet (50 mg total) by mouth daily.   multivitamin capsule Take 1 capsule by mouth daily.   predniSONE 20 MG tablet Commonly known as: DELTASONE 40 mg x3d, 20 mg x2d, 10 mg x2d   PROBIOTIC BLEND PO Take by mouth.   traMADol 50 MG tablet Commonly known as: ULTRAM Take 1 tablet (50 mg total) by mouth at bedtime. Started by: Howard Pouch, DO   valACYclovir 1000 MG tablet Commonly known as: VALTREX 2 po at outbreak onset then repeat in 12h for each outbreak        All past medical history, surgical history, allergies, family history, immunizations andmedications were updated in the EMR today and reviewed under the history and medication portions of their EMR.     ROS Negative, with the exception of above mentioned in HPI   Objective:  BP 128/78   Pulse 70   Temp 98.1 F (36.7 C)   Wt (!) 420 lb 9.6 oz (190.8 kg)   SpO2 99%   BMI 57.04 kg/m  Body mass index is 57.04 kg/m. Physical Exam Vitals and nursing note reviewed.  Constitutional:      General: She is not in acute distress.    Appearance: Normal appearance. She is normal weight. She is not ill-appearing or toxic-appearing.  HENT:     Head: Normocephalic and atraumatic.  Eyes:     General: No scleral icterus.       Right eye: No discharge.        Left eye: No discharge.     Extraocular Movements: Extraocular movements intact.     Conjunctiva/sclera: Conjunctivae normal.     Pupils: Pupils are equal, round, and reactive to light.  Musculoskeletal:     Comments: Decreased range of motion  Skin:    Findings: No rash.  Neurological:     Mental Status: She is alert and oriented to person, place, and time. Mental status is at baseline.     Motor: No weakness.     Coordination: Coordination normal.     Gait: Gait normal.  Psychiatric:  Mood and Affect: Mood normal.        Behavior:  Behavior normal.        Thought Content: Thought content normal.        Judgment: Judgment normal.      No results found. No results found. No results found for this or any previous visit (from the past 24 hour(s)).  Assessment/Plan: Mckenzie [name] is a 67 y.o. female present for OV for  Chronic pain of both shoulders/Primary osteoarthritis, unspecified site We discussed diagnoses and pain. She is going to cut back on the baclofen. Continue the diclofenac. Use tramadol nightly to help with pain.  Thus, also allowing sleep pattern to improve.  Hopefully this will get her back to being more active throughout the day. Okay to go ahead and fill her prednisone prescription, supplied additional prescription for her to hold onto him in the event of being out of [place] with flare. Next step would refer to orthopedics for further evaluation   Reviewed expectations re: course of current medical issues. Discussed self-management of symptoms. Outlined signs and symptoms indicating need for more acute intervention. Patient verbalized understanding and all questions were answered. Patient received an After-Visit Summary.    No orders of the defined types were placed in this encounter.  Meds ordered this encounter  Medications   predniSONE (DELTASONE) 20 MG tablet    Sig: 40 mg x3d, 20 mg x2d, 10 mg x2d    Dispense:  9 tablet    Refill:  0   traMADol (ULTRAM) 50 MG tablet    Sig: Take 1 tablet (50 mg total) by mouth at bedtime.    Dispense:  30 tablet    Refill:  5   Referral Orders  No referral(s) requested today     Note is dictated utilizing voice recognition software. Although note has been proof read prior to signing, occasional typographical errors still can be missed. If any questions arise, please do not hesitate to call for verification.   electronically signed by:  Howard Pouch, DO  [place]

## 2022-11-26 NOTE — Patient Instructions (Addendum)
Maintain your routine follow ups        Great to see you today.  I have refilled the medication(s) we provide.   If labs were collected, we will inform you of lab results once received either by echart message or telephone call.   - echart message- for normal results that have been seen by the patient already.   - telephone call: abnormal results or if patient has not viewed results in their echart.

## 2023-02-13 ENCOUNTER — Other Ambulatory Visit: Payer: Self-pay | Admitting: Family Medicine

## 2023-02-16 ENCOUNTER — Ambulatory Visit (INDEPENDENT_AMBULATORY_CARE_PROVIDER_SITE_OTHER): Payer: Self-pay | Admitting: Family Medicine

## 2023-02-16 ENCOUNTER — Encounter: Payer: Self-pay | Admitting: Family Medicine

## 2023-02-16 VITALS — BP 108/66 | HR 81 | Temp 97.7°F | Wt >= 6400 oz

## 2023-02-16 DIAGNOSIS — E785 Hyperlipidemia, unspecified: Secondary | ICD-10-CM

## 2023-02-16 DIAGNOSIS — M1A0720 Idiopathic chronic gout, left ankle and foot, without tophus (tophi): Secondary | ICD-10-CM

## 2023-02-16 DIAGNOSIS — E1169 Type 2 diabetes mellitus with other specified complication: Secondary | ICD-10-CM

## 2023-02-16 DIAGNOSIS — I10 Essential (primary) hypertension: Secondary | ICD-10-CM

## 2023-02-16 LAB — POCT GLYCOSYLATED HEMOGLOBIN (HGB A1C)
HbA1c POC (<> result, manual entry): 6.5 % (ref 4.0–5.6)
HbA1c, POC (controlled diabetic range): 6.5 % (ref 0.0–7.0)
HbA1c, POC (prediabetic range): 6.5 % — AB (ref 5.7–6.4)
Hemoglobin A1C: 6.5 % — AB (ref 4.0–5.6)

## 2023-02-16 LAB — HM DIABETES EYE EXAM

## 2023-02-16 LAB — MICROALBUMIN / CREATININE URINE RATIO
Creatinine,U: 137.4 mg/dL
Microalb Creat Ratio: 2.5 mg/g (ref 0.0–30.0)
Microalb, Ur: 3.4 mg/dL — ABNORMAL HIGH (ref 0.0–1.9)

## 2023-02-16 MED ORDER — LOSARTAN POTASSIUM 50 MG PO TABS: 50.0000 mg | ORAL_TABLET | Freq: Every day | ORAL | 1 refills | Status: DC

## 2023-02-16 MED ORDER — TRAMADOL HCL 50 MG PO TABS: ORAL_TABLET | ORAL | 5 refills | Status: DC

## 2023-02-16 MED ORDER — AMLODIPINE BESYLATE 10 MG PO TABS: 10.0000 mg | ORAL_TABLET | Freq: Every day | ORAL | 1 refills | Status: DC

## 2023-02-16 MED ORDER — DICLOFENAC SODIUM 75 MG PO TBEC: 75.0000 mg | DELAYED_RELEASE_TABLET | Freq: Two times a day (BID) | ORAL | 1 refills | Status: DC

## 2023-02-16 MED ORDER — GLIPIZIDE 5 MG PO TABS: 2.5000 mg | ORAL_TABLET | Freq: Every day | ORAL | 1 refills | Status: DC

## 2023-02-16 NOTE — Patient Instructions (Addendum)
Return in about 24 weeks (around 08/03/2023) for Routine chronic condition follow-up.        Great to see you today.  I have refilled the medication(s) we provide.   If labs were collected, we will inform you of lab results once received either by echart message or telephone call.   - echart message- for normal results that have been seen by the patient already.   - telephone call: abnormal results or if patient has not viewed results in their echart.

## 2023-02-16 NOTE — Progress Notes (Signed)
Patient ID: Mckenzie [name], female  DOB: 01/26/1956, 67 y.o.   MRN: [id number] Patient Care Team    Relationship Specialty Notifications Start End  Mckenzie Leatherwood, DO PCP - General Family Medicine  03/25/21   Mckenzie Sic, MD Consulting Physician Obstetrics and Gynecology  03/30/19   Mckenzie Elizabeth, MD Consulting Physician Gastroenterology  03/30/19   Mckenzie [name], OD  Ophthalmology  09/15/21     Chief Complaint  Patient presents with   Medical Management of Chronic Issues    Subjective: Mckenzie [name] is a 67 y.o.  female present for chronic medical condition management All past medical history, surgical history, allergies, family history, immunizations, medications and social history were updated in the electronic medical record today. All recent labs, ED visits and hospitalizations within the last year were reviewed.  Essential hypertension/HLD/morbid obesity Pt reports compliance with amlodipine 10 mg daily, Lipitor 40 mg daily, losartan 50 mg daily.  Patient denies chest pain, shortness of breath, dizziness or lower extremity edema.  Pt does not take a daily baby ASA. Pt is  prescribed statin. RF: Hypertension, hyperlipidemia, diabetes, obesity, family history of heart disease  Chronic idiopathic gout involving toe of left foot without tophus Patient reports she has had to take allopurinol 300 mg daily to keep her gout controlled.   Patient reports compliance with allopurinol 300 mg daily.  She uses colchicine with acute flares.  She states she knows she does not follow a gout friendly diet since she loves shellfish and asparagus.  Fever blister Patient reports she gets fever blisters intermittently for the past 2 years.  She likes to have the Valtrex on hand to use if needed.  Hyperlipidemia associated with type 2 diabetes mellitus (HCC) Pt reports he has been a diet-controlled diabetic for some time.   She was prescribed Rybelsus taper last visit and  discontinued after 3 months secondary to cost of Rybelsus.  She knows she would not be able to tolerate metformin if she did  need medication secondary to GI side effects.  Patient denies dizziness, hyperglycemic or hypoglycemic events. Patient denies numbness, tingling in the extremities or nonhealing wounds of feet.   Pain/arthritis: Patient reports compliance  diclofenac BID and feels it is working pretty good for her. The baclofen she has been able to discontinue. Tramadol 50 is not working well to control her pain at night. .  Last visit we started tramadol 50-100 mg nightly for her worsening pain that was preventing her from sleeping soundly.  Today she reports the tramadol has not been working well for her. Prior note: Pt presents for an OV with complaints of difficulty staying asleep secondary to bilateral shoulder pain.  She reports the shoulder pain has been worsening over the last 2 months.  She has been unable to sleep in her bed secondary to discomfort.  She states she is sleeping enough that she falls asleep but as soon as she tries to move or get comfortable, usually about an hour into her sleep, she wakes secondary to pain. She did have a positive ANA that was a very low titer with a negative rheumatoid factor and negative CCP.  Reflex panel was normal.  Sed rate had been elevated to 57.  She has a prednisone prescription in pocket for flares, and she is worried about using it because she has 2 very important conferences throughout the year that she needs to make sure she can get to, and she is fearful she  will need to prednisone during those conferences. She also reports she is not being as active as she used to be secondary to pain.     08/17/2022    1:15 PM 09/15/2021    1:49 PM 08/14/2020   11:32 AM 04/01/2020   11:19 AM 10/23/2019    1:44 PM  Depression screen PHQ 2/9  Decreased Interest 0 0 0 0 0  Down, Depressed, Hopeless 0 0 0 0 0  PHQ - 2 Score 0 0 0 0 0  Altered sleeping  0      Tired, decreased energy  1     Change in appetite  0     Feeling bad or failure about yourself   0     Trouble concentrating  0     Moving slowly or fidgety/restless  0     Suicidal thoughts  0     PHQ-9 Score  1         09/15/2021    1:49 PM  GAD 7 : Generalized Anxiety Score  Nervous, Anxious, on Edge 0  Control/stop worrying 0  Worry too much - different things 0  Trouble relaxing 0  Restless 0  Easily annoyed or irritable 0  Afraid - awful might happen 0  Total GAD 7 Score 0          08/16/2022   10:30 PM 08/14/2020   11:32 AM 04/01/2020   11:19 AM 10/23/2019    1:44 PM 07/19/2019    1:32 PM  Fall Risk   Falls in the past year? 0 0 0 0 0  Number falls in past yr:  0 0 0   Injury with Fall?  0 0 0   Follow up  Falls evaluation completed Falls evaluation completed Falls evaluation completed Falls evaluation completed   Immunization History  Administered Date(s) Administered   COVID-19, mRNA, vaccine(Comirnaty)12 years and older 08/06/2022   PFIZER(Purple Top)SARS-COV-2 Vaccination 11/16/2019, 12/12/2019, 06/07/2020, 12/24/2020   PNEUMOCOCCAL CONJUGATE-20 08/17/2022   Tdap 07/09/2015   Zoster Recombinat (Shingrix) 03/04/2022, 09/02/2022    No results found.  Past Medical History:  Diagnosis Date   Arthritis    Chicken pox    Diabetes mellitus without complication (HCC)    Gout    Hypertension    Myalgia 12/02/2021   Polyarthralgia 12/02/2021   UTI (urinary tract infection)    No Known Allergies Past Surgical History:  Procedure Laterality Date   NO PAST SURGERIES     Family History  Problem Relation Age of Onset   Heart failure Mother    Alcohol abuse Father    COPD Sister    Liver cancer Sister    Alcohol abuse Sister    Nocturnal enuresis Sister    Kidney disease Maternal Grandmother    BRCA 1/2 Neg Hx    Social History   Social History Narrative   Marital status:  Single. 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  Speaking engagements.   Safety:      -Wears a bicycle helmet riding a bike: Yes     -smoke alarm in the home:Yes     - Feels safe in their relationships: Yes          Allergies as of 02/16/2023   No Known Allergies      Medication List        Accurate as of February 16, 2023  1:52 PM. If you have any questions, ask your nurse or doctor.  allopurinol 300 MG tablet Commonly known as: ZYLOPRIM Take 1 tablet (300 mg total) by mouth daily.   amLODipine 10 MG tablet Commonly known as: NORVASC Take 1 tablet (10 mg total) by mouth daily.   atorvastatin 40 MG tablet Commonly known as: LIPITOR Take 1 tablet (40 mg total) by mouth daily.   baclofen 20 MG tablet Commonly known as: LIORESAL Take 1 tablet (20 mg total) by mouth 3 (three) times daily.   colchicine 0.6 MG tablet Take 1 tablet (0.6 mg total) by mouth 2 (two) times daily as needed.   diclofenac 75 MG EC tablet Commonly known as: VOLTAREN Take 1 tablet (75 mg total) by mouth 2 (two) times daily.   Fish Oil 500 MG Caps Take by mouth.   glipiZIDE 5 MG tablet Commonly known as: GLUCOTROL Take 0.5 tablets (2.5 mg total) by mouth daily before breakfast.   JOINT HEALTH PO Take by mouth.   losartan 50 MG tablet Commonly known as: COZAAR Take 1 tablet (50 mg total) by mouth daily.   multivitamin capsule Take 1 capsule by mouth daily.   predniSONE 20 MG tablet Commonly known as: DELTASONE 40 mg x3d, 20 mg x2d, 10 mg x2d   PROBIOTIC BLEND PO Take by mouth.   traMADol 50 MG tablet Commonly known as: ULTRAM 1 tab PO in the day and 2 tabs qhs What changed:  how much to take how to take this when to take this additional instructions Changed by: Felix Pacini, DO   valACYclovir 1000 MG tablet Commonly known as: VALTREX 2 po at outbreak onset then repeat in 12h for each outbreak        All past medical history, surgical history, allergies, family history, immunizations  andmedications were updated in the EMR today and reviewed under the history and medication portions of their EMR.     ROS: 14 pt review of systems performed and negative (unless mentioned in an HPI)  Objective: BP 108/66   Pulse 81   Temp 97.7 F (36.5 C)   Wt (!) 415 lb 12.8 oz (188.6 kg)   SpO2 95%   BMI 56.39 kg/m  Physical Exam Vitals and nursing note reviewed.  Constitutional:      General: She is not in acute distress.    Appearance: Normal appearance. She is obese. She is not ill-appearing, toxic-appearing or diaphoretic.  HENT:     Head: Normocephalic and atraumatic.  Eyes:     General: No scleral icterus.       Right eye: No discharge.        Left eye: No discharge.     Extraocular Movements: Extraocular movements intact.     Conjunctiva/sclera: Conjunctivae normal.     Pupils: Pupils are equal, round, and reactive to light.  Cardiovascular:     Rate and Rhythm: Normal rate and regular rhythm.  Pulmonary:     Effort: Pulmonary effort is normal. No respiratory distress.     Breath sounds: Normal breath sounds. No wheezing, rhonchi or rales.  Musculoskeletal:     Cervical back: Neck supple. No tenderness.     Right lower leg: No edema.     Left lower leg: No edema.  Lymphadenopathy:     Cervical: No cervical adenopathy.  Skin:    General: Skin is warm and dry.     Coloration: Skin is not jaundiced or pale.     Findings: No erythema or rash.  Neurological:     Mental Status: She is alert and  oriented to person, place, and time. Mental status is at baseline.     Motor: No weakness.     Gait: Gait normal.  Psychiatric:        Mood and Affect: Mood normal.        Behavior: Behavior normal.        Thought Content: Thought content normal.        Judgment: Judgment normal.     Assessment/plan: Mckenzie [name] is a 67 y.o. female present for chronic condition follow-up Essential hypertension/HLD Stable goal is <130/80 with her history of diabetes. Continue  amlodipine 10 mg daily Continue losartan to 50 mg daily. Continue atorvastatin 40 mg daily.  Higher doses made her sick. Could consider Zetia add on if needed. LDL goal<70 if able. . Low-sodium diet. Continue to exercise.  Fever blister Valtrex as needed prescribed for her.  Hyperlipidemia associated with type 2 diabetes mellitus (HCC)/Morbid obesity (HCC)/BMI 50.0-59.9, adult (HCC) better Continue Glipizide 2.5 QD LDL goal <70 Continue atorvastatin 40 mg nightly Atorvastatin 40 mg daily - PNA series: PNA20.(After 65) Flu shot: Declined (recommneded yearly) Foot exam: UTD 02/16/2023 Eye exam: completed 02/16/2023 vision source [place] A1c 6.1>6.8 >7.1> 6.9> 6.5 collected today.  CBC, CMP, TSH and lipids collected today.  Idiopathic chronic gout of left foot without tophus Stable Continue allopurinol 300 mg daily Uric acid goal < 6- at goal.  Struggles with low purine diet. Has a prednisone taper on hand in the event of flare  Osteoarthritis (shoulder pain worse bilateral) Stable Next step would refer to orthopedics for further evaluation if pain worsens Not needing baclofen now.  Continue diclofenac BID    Return in about 24 weeks (around 08/03/2023) for Routine chronic condition follow-up.  Orders Placed This Encounter  Procedures   Comp Met (CMET)   Direct LDL   Urine Microalbumin w/creat. ratio   CBC   TSH   POCT glycosylated hemoglobin (Hb A1C)   Meds ordered this encounter  Medications   amLODipine (NORVASC) 10 MG tablet    Sig: Take 1 tablet (10 mg total) by mouth daily.    Dispense:  90 tablet    Refill:  1   diclofenac (VOLTAREN) 75 MG EC tablet    Sig: Take 1 tablet (75 mg total) by mouth 2 (two) times daily.    Dispense:  180 tablet    Refill:  1    Dc mobic please   glipiZIDE (GLUCOTROL) 5 MG tablet    Sig: Take 0.5 tablets (2.5 mg total) by mouth daily before breakfast.    Dispense:  45 tablet    Refill:  1   losartan (COZAAR) 50 MG tablet     Sig: Take 1 tablet (50 mg total) by mouth daily.    Dispense:  90 tablet    Refill:  1   traMADol (ULTRAM) 50 MG tablet    Sig: 1 tab PO in the day and 2 tabs qhs    Dispense:  90 tablet    Refill:  5    Referral Orders  No referral(s) requested today      Note is dictated utilizing voice recognition software. Although note has been proof read prior to signing, occasional typographical errors still can be missed. If any questions arise, please do not hesitate to call for verification.  Electronically signed by: Felix Pacini, DO [facility] [place]

## 2023-02-17 LAB — CBC
HCT: 36.5 % (ref 35.0–45.0)
Hemoglobin: 11.9 g/dL (ref 11.7–15.5)
MCH: 28.3 pg (ref 27.0–33.0)
MCHC: 32.6 g/dL (ref 32.0–36.0)
MCV: 86.9 fL (ref 80.0–100.0)
MPV: 11.2 fL (ref 7.5–12.5)
Platelets: 290 10*3/uL (ref 140–400)
RBC: 4.2 10*6/uL (ref 3.80–5.10)
RDW: 14.8 % (ref 11.0–15.0)
WBC: 7.9 10*3/uL (ref 3.8–10.8)

## 2023-02-17 LAB — COMPREHENSIVE METABOLIC PANEL
AG Ratio: 1.1 (calc) (ref 1.0–2.5)
ALT: 21 U/L (ref 6–29)
AST: 17 U/L (ref 10–35)
Albumin: 3.6 g/dL (ref 3.6–5.1)
Alkaline phosphatase (APISO): 92 U/L (ref 37–153)
BUN: 18 mg/dL (ref 7–25)
CO2: 27 mmol/L (ref 20–32)
Calcium: 9.7 mg/dL (ref 8.6–10.4)
Chloride: 101 mmol/L (ref 98–110)
Creat: 0.9 mg/dL (ref 0.50–1.05)
Globulin: 3.4 g/dL (calc) (ref 1.9–3.7)
Glucose, Bld: 120 mg/dL — ABNORMAL HIGH (ref 65–99)
Potassium: 4.5 mmol/L (ref 3.5–5.3)
Sodium: 141 mmol/L (ref 135–146)
Total Bilirubin: 0.7 mg/dL (ref 0.2–1.2)
Total Protein: 7 g/dL (ref 6.1–8.1)

## 2023-02-17 LAB — LDL CHOLESTEROL, DIRECT: Direct LDL: 86 mg/dL (ref <100)

## 2023-02-17 LAB — TSH: TSH: 3.36 mIU/L (ref 0.40–4.50)

## 2023-02-28 ENCOUNTER — Telehealth: Payer: Self-pay | Admitting: Family Medicine

## 2023-02-28 NOTE — Telephone Encounter (Signed)
Patient called and states she had some discrepancies regarding billing. Patient was provided with the billing departments phone number. I was unaware at the time the bill was for [facility], and they patient called back and stated the number was for [facility] and stated that is correct, and that's when the patient proceeded to inform me this was for labs and [facility]. I advised the patient to give [facility] a call regarding that bill. She went on to say that she has and they told her to call or office since her labs should be included with her service. Patient stated that she would give me until Wednesday to have some information and she would check back. The patient is aware that I was reaching out get information on her behalf and I would give her a call when I learned of any new information.

## 2023-03-04 NOTE — Telephone Encounter (Signed)
Patient called back to speak with Karena Addison.  Patient was told by Karena Addison she would call her back and give her update on how to handle billing situation with her bill she received from [facility]. I told patient I was aware of call and knew that Victorio Palm, Engineer, manufacturing, was reviewing chart on her concerns.  I told her that I would take message and have [facility] call once she had an answer.  She said that was not good enough, and this can refect on her credit because she received  $800 bill from [facility].  Patient is self pay. She said she paid in full at her last OV. Patient said she would call back on Monday; patient asked what her hours were on Monday.  I told her that I wasn't aware of time that she would be here. But I am certain her concerns of this matter were getting reviewed and will be addressed in timely manner.  I printed out [employer] Self Pay estimate from OV 02/16/23. Placed in Genera's inbox.

## 2023-03-07 NOTE — Telephone Encounter (Signed)
Spoke to patient and informed that I am waiting to hear back from [facility] lab to see if they are offering any discounts to our self pay patient. Once I speak with [facility] representative patient will be updated.

## 2023-05-27 ENCOUNTER — Encounter: Payer: Self-pay | Admitting: Family Medicine

## 2023-05-27 ENCOUNTER — Ambulatory Visit (INDEPENDENT_AMBULATORY_CARE_PROVIDER_SITE_OTHER): Payer: Self-pay | Admitting: Family Medicine

## 2023-05-27 VITALS — BP 130/80 | Temp 97.8°F | Wt >= 6400 oz

## 2023-05-27 DIAGNOSIS — R600 Localized edema: Secondary | ICD-10-CM

## 2023-05-27 MED ORDER — PREDNISONE 20 MG PO TABS: ORAL_TABLET | ORAL | 0 refills | Status: DC

## 2023-05-27 MED ORDER — FUROSEMIDE 20 MG PO TABS: 20.0000 mg | ORAL_TABLET | Freq: Every day | ORAL | 1 refills | Status: DC | PRN

## 2023-05-27 NOTE — Progress Notes (Signed)
Mckenzie [name] , 02/15/1956, 67 y.o., female MRN: [id number] Patient Care Team    Relationship Specialty Notifications Start End  Mckenzie Leatherwood, DO PCP - General Family Medicine  03/25/21   Mckenzie Sic, MD Consulting Physician Obstetrics and Gynecology  03/30/19   Mckenzie Elizabeth, MD Consulting Physician Gastroenterology  03/30/19   Mckenzie [name], OD  Ophthalmology  09/15/21     Chief Complaint  Patient presents with   Edema    feet     Subjective: Mckenzie [name] is a 67 y.o. Pt presents for an OV with complaints of lower extremity edema of 4 days duration.  Associated symptoms include burning sensation. Pt has tried keeping legs elevated to ease their symptoms.  Denies chest pain or shortness of breath. She does not believe she had a high sodium meal.     02/18/2023    9:28 AM 08/17/2022    1:15 PM 09/15/2021    1:49 PM 08/14/2020   11:32 AM 04/01/2020   11:19 AM  Depression screen PHQ 2/9  Decreased Interest 0 0 0 0 0  Down, Depressed, Hopeless 0 0 0 0 0  PHQ - 2 Score 0 0 0 0 0  Altered sleeping   0    Tired, decreased energy   1    Change in appetite   0    Feeling bad or failure about yourself    0    Trouble concentrating   0    Moving slowly or fidgety/restless   0    Suicidal thoughts   0    PHQ-9 Score   1      No Known Allergies Social History   Social History Narrative   Marital status:  Single. 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  Speaking engagements.   Safety:      -Wears a bicycle helmet riding a bike: Yes     -smoke alarm in the home:Yes     - Feels safe in their relationships: Yes         Past Medical History:  Diagnosis Date   Arthritis    Chicken pox    Diabetes mellitus without complication (HCC)    Gout    Hypertension    Myalgia 12/02/2021   Polyarthralgia 12/02/2021   UTI (urinary tract infection)    Past Surgical History:  Procedure Laterality Date   NO PAST SURGERIES      Family History  Problem Relation Age of Onset   Heart failure Mother    Alcohol abuse Father    COPD Sister    Liver cancer Sister    Alcohol abuse Sister    Nocturnal enuresis Sister    Kidney disease Maternal Grandmother    BRCA 1/2 Neg Hx    Allergies as of 05/27/2023   No Known Allergies      Medication List        Accurate as of May 27, 2023  1:19 PM. If you have any questions, ask your nurse or doctor.          allopurinol 300 MG tablet Commonly known as: ZYLOPRIM Take 1 tablet (300 mg total) by mouth daily.   amLODipine 10 MG tablet Commonly known as: NORVASC Take 1 tablet (10 mg total) by mouth daily.   atorvastatin 40 MG tablet Commonly known as: LIPITOR Take 1 tablet (40 mg total) by mouth daily.   baclofen 20 MG tablet Commonly known as:  LIORESAL Take 1 tablet (20 mg total) by mouth 3 (three) times daily.   colchicine 0.6 MG tablet Take 1 tablet (0.6 mg total) by mouth 2 (two) times daily as needed.   diclofenac 75 MG EC tablet Commonly known as: VOLTAREN Take 1 tablet (75 mg total) by mouth 2 (two) times daily.   Fish Oil 500 MG Caps Take by mouth.   furosemide 20 MG tablet Commonly known as: LASIX Take 1 tablet (20 mg total) by mouth daily as needed. Started by: Felix Pacini   glipiZIDE 5 MG tablet Commonly known as: GLUCOTROL Take 0.5 tablets (2.5 mg total) by mouth daily before breakfast.   JOINT HEALTH PO Take by mouth.   losartan 50 MG tablet Commonly known as: COZAAR Take 1 tablet (50 mg total) by mouth daily.   multivitamin capsule Take 1 capsule by mouth daily.   predniSONE 20 MG tablet Commonly known as: DELTASONE 40 mg x3d, 20 mg x2d, 10 mg x2d   PROBIOTIC BLEND PO Take by mouth.   traMADol 50 MG tablet Commonly known as: ULTRAM 1 tab PO in the day and 2 tabs qhs   valACYclovir 1000 MG tablet Commonly known as: VALTREX 2 po at outbreak onset then repeat in 12h for each outbreak        All past  medical history, surgical history, allergies, family history, immunizations andmedications were updated in the EMR today and reviewed under the history and medication portions of their EMR.     ROS Negative, with the exception of above mentioned in HPI   Objective:  BP 130/80   Temp 97.8 F (36.6 C)   Wt (!) 424 lb 6.4 oz (192.5 kg)   BMI 57.56 kg/m  Body mass index is 57.56 kg/m.  Physical Exam Vitals and nursing note reviewed.  Constitutional:      General: She is not in acute distress.    Appearance: Normal appearance. She is normal weight. She is not ill-appearing or toxic-appearing.  HENT:     Head: Normocephalic and atraumatic.  Eyes:     General: No scleral icterus.       Right eye: No discharge.        Left eye: No discharge.     Extraocular Movements: Extraocular movements intact.     Conjunctiva/sclera: Conjunctivae normal.     Pupils: Pupils are equal, round, and reactive to light.  Musculoskeletal:     Right lower leg: Edema present.     Left lower leg: Edema present.  Skin:    Findings: No rash.  Neurological:     Mental Status: She is alert and oriented to person, place, and time. Mental status is at baseline.     Motor: No weakness.     Coordination: Coordination normal.     Gait: Gait normal.  Psychiatric:        Mood and Affect: Mood normal.        Behavior: Behavior normal.        Thought Content: Thought content normal.        Judgment: Judgment normal.    No results found. No results found. No results found for this or any previous visit (from the past 24 hour(s)).  Assessment/Plan: [name] is a 67 y.o. female present for OV for  Bilateral lower extremity edema +1 bilateral lower extremity edema present today on exam. Lasix 20 mg daily x 3 days prescribed. Patient encouraged to monitor her dry weights every morning if there is  a 3 pound weight gain in a 24-hour.  She is to take the Lasix in the future. Low-sodium diet  encouraged Keep legs elevated when able Follow-up as needed  Reviewed expectations re: course of current medical issues. Discussed self-management of symptoms. Outlined signs and symptoms indicating need for more acute intervention. Patient verbalized understanding and all questions were answered. Patient received an After-Visit Summary.    No orders of the defined types were placed in this encounter.  Meds ordered this encounter  Medications   furosemide (LASIX) 20 MG tablet    Sig: Take 1 tablet (20 mg total) by mouth daily as needed.    Dispense:  30 tablet    Refill:  1   predniSONE (DELTASONE) 20 MG tablet    Sig: 40 mg x3d, 20 mg x2d, 10 mg x2d    Dispense:  9 tablet    Refill:  0   Referral Orders  No referral(s) requested today     Note is dictated utilizing voice recognition software. Although note has been proof read prior to signing, occasional typographical errors still can be missed. If any questions arise, please do not hesitate to call for verification.   electronically signed by:  Felix Pacini, DO  [facility] [place]

## 2023-06-09 LAB — COLOGUARD: Cologuard: NEGATIVE

## 2023-06-14 ENCOUNTER — Telehealth: Payer: Self-pay

## 2023-06-14 NOTE — Telephone Encounter (Signed)
Unfortunately, there is not cough medicine that can be prescribed for her, that is not already available over-the-counter. The cough syrups that contain codeine or Hycodan, require a visit with a provider by law.

## 2023-06-14 NOTE — Telephone Encounter (Signed)
MyChart msg sent and read

## 2023-06-14 NOTE — Telephone Encounter (Signed)
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 I offered an office visit, she declined.  She states she is self pay and trying to save money.  Please advise. [phone]

## 2023-06-16 ENCOUNTER — Encounter: Payer: Self-pay | Admitting: Gastroenterology

## 2023-06-16 LAB — COLOGUARD: COLOGUARD: NEGATIVE

## 2023-06-16 LAB — EXTERNAL GENERIC LAB PROCEDURE: COLOGUARD: NEGATIVE

## 2023-06-27 NOTE — Telephone Encounter (Signed)
Representative from [facility] reached out to patient to rectify.

## 2023-08-21 ENCOUNTER — Other Ambulatory Visit: Payer: Self-pay | Admitting: Family Medicine

## 2023-08-24 ENCOUNTER — Ambulatory Visit (INDEPENDENT_AMBULATORY_CARE_PROVIDER_SITE_OTHER): Payer: Self-pay | Admitting: Family Medicine

## 2023-08-24 ENCOUNTER — Encounter: Payer: Self-pay | Admitting: Family Medicine

## 2023-08-24 VITALS — BP 132/82 | HR 88 | Temp 97.8°F | Wt >= 6400 oz

## 2023-08-24 DIAGNOSIS — M1A0720 Idiopathic chronic gout, left ankle and foot, without tophus (tophi): Secondary | ICD-10-CM

## 2023-08-24 DIAGNOSIS — M25511 Pain in right shoulder: Secondary | ICD-10-CM

## 2023-08-24 DIAGNOSIS — Z7984 Long term (current) use of oral hypoglycemic drugs: Secondary | ICD-10-CM

## 2023-08-24 DIAGNOSIS — E1169 Type 2 diabetes mellitus with other specified complication: Secondary | ICD-10-CM

## 2023-08-24 DIAGNOSIS — Z6841 Body Mass Index (BMI) 40.0 and over, adult: Secondary | ICD-10-CM

## 2023-08-24 DIAGNOSIS — E785 Hyperlipidemia, unspecified: Secondary | ICD-10-CM

## 2023-08-24 DIAGNOSIS — I10 Essential (primary) hypertension: Secondary | ICD-10-CM

## 2023-08-24 DIAGNOSIS — M150 Primary generalized (osteo)arthritis: Secondary | ICD-10-CM

## 2023-08-24 DIAGNOSIS — G8929 Other chronic pain: Secondary | ICD-10-CM

## 2023-08-24 DIAGNOSIS — M25512 Pain in left shoulder: Secondary | ICD-10-CM

## 2023-08-24 LAB — POCT GLYCOSYLATED HEMOGLOBIN (HGB A1C)
HbA1c POC (<> result, manual entry): 6.6 % (ref 4.0–5.6)
HbA1c, POC (controlled diabetic range): 6.6 % (ref 0.0–7.0)
HbA1c, POC (prediabetic range): 6.6 % — AB (ref 5.7–6.4)
Hemoglobin A1C: 6.6 % — AB (ref 4.0–5.6)

## 2023-08-24 MED ORDER — COLCHICINE 0.6 MG PO TABS: 0.6000 mg | ORAL_TABLET | Freq: Two times a day (BID) | ORAL | 5 refills | Status: DC | PRN

## 2023-08-24 MED ORDER — VALACYCLOVIR HCL 1 G PO TABS: ORAL_TABLET | ORAL | 2 refills | Status: DC

## 2023-08-24 MED ORDER — ATORVASTATIN CALCIUM 40 MG PO TABS: 40.0000 mg | ORAL_TABLET | Freq: Every day | ORAL | 3 refills | Status: DC

## 2023-08-24 MED ORDER — BACLOFEN 20 MG PO TABS: 20.0000 mg | ORAL_TABLET | Freq: Three times a day (TID) | ORAL | 5 refills | Status: DC

## 2023-08-24 MED ORDER — DICLOFENAC SODIUM 75 MG PO TBEC: 75.0000 mg | DELAYED_RELEASE_TABLET | Freq: Two times a day (BID) | ORAL | 1 refills | Status: DC

## 2023-08-24 MED ORDER — ALLOPURINOL 300 MG PO TABS: 300.0000 mg | ORAL_TABLET | Freq: Every day | ORAL | 3 refills | Status: DC

## 2023-08-24 MED ORDER — TRAMADOL HCL 50 MG PO TABS: ORAL_TABLET | ORAL | 5 refills | Status: DC

## 2023-08-24 MED ORDER — AMLODIPINE BESYLATE 10 MG PO TABS: 10.0000 mg | ORAL_TABLET | Freq: Every day | ORAL | 1 refills | Status: DC

## 2023-08-24 MED ORDER — PREDNISONE 20 MG PO TABS: ORAL_TABLET | ORAL | 0 refills | Status: DC

## 2023-08-24 MED ORDER — LOSARTAN POTASSIUM 50 MG PO TABS: 50.0000 mg | ORAL_TABLET | Freq: Every day | ORAL | 1 refills | Status: DC

## 2023-08-24 MED ORDER — GLIPIZIDE 5 MG PO TABS: 2.5000 mg | ORAL_TABLET | Freq: Every day | ORAL | 1 refills | Status: DC

## 2023-08-24 NOTE — Patient Instructions (Signed)

## 2023-08-24 NOTE — Progress Notes (Signed)
Patient ID: Mckenzie [name], female  DOB: 1955-10-15, 67 y.o.   MRN: [id number] Patient Care Team    Relationship Specialty Notifications Start End  Natalia Leatherwood, DO PCP - General Family Medicine  03/25/21   Fortino Sic, MD Consulting Physician Obstetrics and Gynecology  03/30/19   Charna Elizabeth, MD Consulting Physician Gastroenterology  03/30/19   Rodrigo Ran, OD  Ophthalmology  09/15/21     Chief Complaint  Patient presents with   Diabetes    Subjective: Mckenzie [name] is a 67 y.o.  female present for chronic medical condition management All past medical history, surgical history, allergies, family history, immunizations, medications and social history were updated in the electronic medical record today. All recent labs, ED visits and hospitalizations within the last year were reviewed.  Essential hypertension/HLD/morbid obesity Pt reports compliance with amlodipine 10 mg daily, Lipitor 40 mg daily, losartan 50 mg daily.  Patient denies chest pain, shortness of breath, dizziness or lower extremity edema.  Pt does not take a daily baby ASA. Pt is  prescribed statin. RF: Hypertension, hyperlipidemia, diabetes, obesity, family history of heart disease  Chronic idiopathic gout involving toe of left foot without tophus Patient reports she has had to take allopurinol 300 mg daily to keep her gout controlled.   Patient reports compliance with allopurinol 300 mg daily.  She uses colchicine or steroids with acute flares.  She states she knows she does not follow a gout friendly diet since she loves shellfish and asparagus.  Fever blister Patient reports she gets fever blisters intermittently for the past 2 years.  She likes to have the Valtrex on hand to use if needed.  type 2 diabetes mellitus with hyperlipidemia (HCC) Patient reports compliance with glipizide 2.5 mg, started last visit. She was tried on Rybelsus in the past, but cost was prohibitive. She knows she  would not be able to tolerate metformin if she did  need medication secondary to GI side effects.  Patient denies dizziness, hyperglycemic or hypoglycemic events. Patient denies numbness, tingling in the extremities or nonhealing wounds of feet.   Pain/arthritis: Patient reports compliance diclofenac BID and feels it is working pretty good for her. Has used baclofen on a few occasions when needed. Tramadol 50-100 mg is not working well to control her pain at night.  Prior note: Pt presents for an OV with complaints of difficulty staying asleep secondary to bilateral shoulder pain.  She reports the shoulder pain has been worsening over the last 2 months.  She has been unable to sleep in her bed secondary to discomfort.  She states she is sleeping enough that she falls asleep but as soon as she tries to move or get comfortable, usually about an hour into her sleep, she wakes secondary to pain. She did have a positive ANA that was a very low titer with a negative rheumatoid factor and negative CCP.  Reflex panel was normal.  Sed rate had been elevated to 57.  She has a prednisone prescription in pocket for flares, and she is worried about using it because she has 2 very important conferences throughout the year that she needs to make sure she can get to, and she is fearful she will need to prednisone during those conferences. She also reports she is not being as active as she used to be secondary to pain.     02/18/2023    9:28 AM 08/17/2022    1:15 PM 09/15/2021  1:49 PM 08/14/2020   11:32 AM 04/01/2020   11:19 AM  Depression screen PHQ 2/9  Decreased Interest 0 0 0 0 0  Down, Depressed, Hopeless 0 0 0 0 0  PHQ - 2 Score 0 0 0 0 0  Altered sleeping   0    Tired, decreased energy   1    Change in appetite   0    Feeling bad or failure about yourself    0    Trouble concentrating   0    Moving slowly or fidgety/restless   0    Suicidal thoughts   0    PHQ-9 Score   1        09/15/2021     1:49 PM  GAD 7 : Generalized Anxiety Score  Nervous, Anxious, on Edge 0  Control/stop worrying 0  Worry too much - different things 0  Trouble relaxing 0  Restless 0  Easily annoyed or irritable 0  Afraid - awful might happen 0  Total GAD 7 Score 0          08/23/2023    7:15 PM 02/18/2023    9:28 AM 08/16/2022   10:30 PM 08/14/2020   11:32 AM 04/01/2020   11:19 AM  Fall Risk   Falls in the past year? 0 0 0 0 0  Number falls in past yr:  0  0 0  Injury with Fall?  0  0 0  Risk for fall due to :  No Fall Risks     Follow up  Falls evaluation completed  Falls evaluation completed Falls evaluation completed   Immunization History  Administered Date(s) Administered   PFIZER(Purple Top)SARS-COV-2 Vaccination 11/16/2019, 12/12/2019, 06/07/2020, 12/24/2020   PNEUMOCOCCAL CONJUGATE-20 08/17/2022   Pfizer(Comirnaty)Fall Seasonal Vaccine 12 years and older 08/06/2022   Tdap 07/09/2015   Zoster Recombinant(Shingrix) 03/04/2022, 09/02/2022    No results found.  Past Medical History:  Diagnosis Date   Arthritis    Chicken pox    Diabetes mellitus without complication (HCC)    Gout    Hypertension    Myalgia 12/02/2021   Polyarthralgia 12/02/2021   UTI (urinary tract infection)    No Known Allergies Past Surgical History:  Procedure Laterality Date   NO PAST SURGERIES     Family History  Problem Relation Age of Onset   Heart failure Mother    Alcohol abuse Father    COPD Sister    Liver cancer Sister    Alcohol abuse Sister    Nocturnal enuresis Sister    Kidney disease Maternal Grandmother    BRCA 1/2 Neg Hx    Social History   Social History Narrative   Marital status:  Single. 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  Speaking engagements.   Safety:      -Wears a bicycle helmet riding a bike: Yes     -smoke alarm in the home:Yes     - Feels safe in their relationships: Yes          Allergies as of 08/24/2023   No  Known Allergies      Medication List        Accurate as of August 24, 2023  2:41 PM. If you have any questions, ask your nurse or doctor.          allopurinol 300 MG tablet Commonly known as: ZYLOPRIM Take 1 tablet (300 mg total) by mouth daily.   amLODipine 10 MG tablet  Commonly known as: NORVASC Take 1 tablet (10 mg total) by mouth daily.   atorvastatin 40 MG tablet Commonly known as: LIPITOR Take 1 tablet (40 mg total) by mouth daily.   baclofen 20 MG tablet Commonly known as: LIORESAL Take 1 tablet (20 mg total) by mouth 3 (three) times daily.   colchicine 0.6 MG tablet Take 1 tablet (0.6 mg total) by mouth 2 (two) times daily as needed.   diclofenac 75 MG EC tablet Commonly known as: VOLTAREN Take 1 tablet (75 mg total) by mouth 2 (two) times daily.   Fish Oil 500 MG Caps Take by mouth.   furosemide 20 MG tablet Commonly known as: LASIX Take 1 tablet (20 mg total) by mouth daily as needed.   glipiZIDE 5 MG tablet Commonly known as: GLUCOTROL Take 0.5 tablets (2.5 mg total) by mouth daily before breakfast.   JOINT HEALTH PO Take by mouth.   losartan 50 MG tablet Commonly known as: COZAAR Take 1 tablet (50 mg total) by mouth daily.   multivitamin capsule Take 1 capsule by mouth daily.   predniSONE 20 MG tablet Commonly known as: DELTASONE 40 mg x3d, 20 mg x2d, 10 mg x2d   PROBIOTIC BLEND PO Take by mouth.   traMADol 50 MG tablet Commonly known as: ULTRAM 1 tab PO in the day and 2 tabs qhs   valACYclovir 1000 MG tablet Commonly known as: VALTREX 2 po at outbreak onset then repeat in 12h for each outbreak        All past medical history, surgical history, allergies, family history, immunizations andmedications were updated in the EMR today and reviewed under the history and medication portions of their EMR.     ROS: 14 pt review of systems performed and negative (unless mentioned in an HPI)  Objective: BP 132/82   Pulse 88   Temp  97.8 F (36.6 C)   Wt (!) 414 lb (187.8 kg)   SpO2 97%   BMI 56.15 kg/m  Physical Exam Vitals and nursing note reviewed.  Constitutional:      General: She is not in acute distress.    Appearance: Normal appearance. She is obese. She is not ill-appearing, toxic-appearing or diaphoretic.  HENT:     Head: Normocephalic and atraumatic.  Eyes:     General: No scleral icterus.       Right eye: No discharge.        Left eye: No discharge.     Extraocular Movements: Extraocular movements intact.     Conjunctiva/sclera: Conjunctivae normal.     Pupils: Pupils are equal, round, and reactive to light.  Cardiovascular:     Rate and Rhythm: Normal rate and regular rhythm.     Heart sounds: No murmur heard. Pulmonary:     Effort: Pulmonary effort is normal. No respiratory distress.     Breath sounds: Normal breath sounds. No wheezing, rhonchi or rales.  Musculoskeletal:     Cervical back: Neck supple. No tenderness.     Right lower leg: No edema.     Left lower leg: No edema.  Lymphadenopathy:     Cervical: No cervical adenopathy.  Skin:    General: Skin is warm and dry.     Coloration: Skin is not jaundiced or pale.     Findings: No erythema or rash.  Neurological:     Mental Status: She is alert and oriented to person, place, and time. Mental status is at baseline.     Motor: No weakness.  Gait: Gait normal.  Psychiatric:        Mood and Affect: Mood normal.        Behavior: Behavior normal.        Thought Content: Thought content normal.        Judgment: Judgment normal.    Diabetic Foot Exam - Simple   No data filed      Assessment/plan: Mckenzie [name] is a 67 y.o. female present for chronic condition follow-up Essential hypertension/HLD Stable goal is <130/80 with her history of diabetes. Continue amlodipine 10 mg daily Continue losartan to 50 mg daily. Continue atorvastatin 40 mg daily.  Higher doses made her sick. Could consider  Continue Lasix 20 mg daily  as needed as needed only for edema. LDL goal<70 if able. . Low-sodium diet. Continue to exercise.  Fever blister Valtrex as needed prescribed for her.  Refilled  type 2 diabetes mellitus (HCC) with hyperlipidemia/Morbid obesity (HCC)/BMI 50.0-59.9, adult (HCC) Stable Continue glipizide 2.5 QD LDL goal <70 Continue atorvastatin 40 mg nightly Atorvastatin 40 mg daily - PNA series: PNA20.(After 65)-self-pay, recommend receiving at health department. Flu shot: Declined (recommneded yearly) Foot exam: UTD 02/16/2023 Eye exam: completed 02/16/2023 vision source [place] A1c 6.1>6.8 >7.1> 6.9> 6.5 > 6.6 collected today.   Idiopathic chronic gout of left foot without tophus Stable Continue allopurinol 300 mg daily Uric acid goal < 6- at goal.  Struggles with low purine diet. Has a prednisone taper on hand in the event of flare  Osteoarthritis (shoulder pain worse bilateral) Stable Next step would refer to orthopedics for further evaluation if pain worsens baclofen prn Continue diclofenac BID   Return in about 24 weeks (around 02/08/2024) for Routine chronic condition follow-up.  Orders Placed This Encounter  Procedures   POCT HgB A1C   Meds ordered this encounter  Medications   atorvastatin (LIPITOR) 40 MG tablet    Sig: Take 1 tablet (40 mg total) by mouth daily.    Dispense:  90 tablet    Refill:  3   amLODipine (NORVASC) 10 MG tablet    Sig: Take 1 tablet (10 mg total) by mouth daily.    Dispense:  90 tablet    Refill:  1   losartan (COZAAR) 50 MG tablet    Sig: Take 1 tablet (50 mg total) by mouth daily.    Dispense:  90 tablet    Refill:  1   glipiZIDE (GLUCOTROL) 5 MG tablet    Sig: Take 0.5 tablets (2.5 mg total) by mouth daily before breakfast.    Dispense:  45 tablet    Refill:  1   diclofenac (VOLTAREN) 75 MG EC tablet    Sig: Take 1 tablet (75 mg total) by mouth 2 (two) times daily.    Dispense:  180 tablet    Refill:  1    Dc mobic please   colchicine  0.6 MG tablet    Sig: Take 1 tablet (0.6 mg total) by mouth 2 (two) times daily as needed.    Dispense:  12 tablet    Refill:  5    Hold until pt request   allopurinol (ZYLOPRIM) 300 MG tablet    Sig: Take 1 tablet (300 mg total) by mouth daily.    Dispense:  90 tablet    Refill:  3   baclofen (LIORESAL) 20 MG tablet    Sig: Take 1 tablet (20 mg total) by mouth 3 (three) times daily.    Dispense:  90 tablet  Refill:  5    Hold until pt request   valACYclovir (VALTREX) 1000 MG tablet    Sig: 2 po at outbreak onset then repeat in 12h for each outbreak    Dispense:  20 tablet    Refill:  2    Hold until pt request   predniSONE (DELTASONE) 20 MG tablet    Sig: 40 mg x3d, 20 mg x2d, 10 mg x2d    Dispense:  9 tablet    Refill:  0   traMADol (ULTRAM) 50 MG tablet    Sig: 1 tab PO in the day and 2 tabs qhs    Dispense:  90 tablet    Refill:  5    Referral Orders  No referral(s) requested today      Note is dictated utilizing voice recognition software. Although note has been proof read prior to signing, occasional typographical errors still can be missed. If any questions arise, please do not hesitate to call for verification.  Electronically signed by: Felix Pacini, DO [facility] [place]

## 2024-01-04 ENCOUNTER — Telehealth: Payer: Self-pay | Admitting: Family Medicine

## 2024-01-04 ENCOUNTER — Ambulatory Visit: Payer: Self-pay | Admitting: Family Medicine

## 2024-01-04 ENCOUNTER — Ambulatory Visit (HOSPITAL_BASED_OUTPATIENT_CLINIC_OR_DEPARTMENT_OTHER)
Admission: RE | Admit: 2024-01-04 | Discharge: 2024-01-04 | Disposition: A | Discharge status: Home / Self Care | Payer: Self-pay | Source: Ambulatory Visit | Attending: Family Medicine | Admitting: Family Medicine

## 2024-01-04 ENCOUNTER — Encounter: Payer: Self-pay | Admitting: Family Medicine

## 2024-01-04 VITALS — BP 132/82 | HR 75 | Temp 97.8°F

## 2024-01-04 DIAGNOSIS — M150 Primary generalized (osteo)arthritis: Secondary | ICD-10-CM

## 2024-01-04 DIAGNOSIS — Z6841 Body Mass Index (BMI) 40.0 and over, adult: Secondary | ICD-10-CM

## 2024-01-04 DIAGNOSIS — M25512 Pain in left shoulder: Secondary | ICD-10-CM

## 2024-01-04 DIAGNOSIS — M25511 Pain in right shoulder: Secondary | ICD-10-CM

## 2024-01-04 DIAGNOSIS — J810 Acute pulmonary edema: Secondary | ICD-10-CM | POA: Insufficient documentation

## 2024-01-04 DIAGNOSIS — I10 Essential (primary) hypertension: Secondary | ICD-10-CM

## 2024-01-04 DIAGNOSIS — Z7984 Long term (current) use of oral hypoglycemic drugs: Secondary | ICD-10-CM

## 2024-01-04 DIAGNOSIS — G8929 Other chronic pain: Secondary | ICD-10-CM

## 2024-01-04 DIAGNOSIS — E1169 Type 2 diabetes mellitus with other specified complication: Secondary | ICD-10-CM

## 2024-01-04 DIAGNOSIS — R0600 Dyspnea, unspecified: Secondary | ICD-10-CM

## 2024-01-04 DIAGNOSIS — M1A0720 Idiopathic chronic gout, left ankle and foot, without tophus (tophi): Secondary | ICD-10-CM

## 2024-01-04 DIAGNOSIS — E785 Hyperlipidemia, unspecified: Secondary | ICD-10-CM

## 2024-01-04 HISTORY — DX: Acute pulmonary edema: J81.0

## 2024-01-04 LAB — CBC WITH DIFFERENTIAL/PLATELET
Basophils Absolute: 0 10*3/uL (ref 0.0–0.1)
Basophils Relative: 0.5 % (ref 0.0–3.0)
Eosinophils Absolute: 0.3 10*3/uL (ref 0.0–0.7)
Eosinophils Relative: 3.1 % (ref 0.0–5.0)
HCT: 37 % (ref 36.0–46.0)
Hemoglobin: 11.8 g/dL — ABNORMAL LOW (ref 12.0–15.0)
Lymphocytes Relative: 22.4 % (ref 12.0–46.0)
Lymphs Abs: 2 10*3/uL (ref 0.7–4.0)
MCHC: 31.9 g/dL (ref 30.0–36.0)
MCV: 88.7 fl (ref 78.0–100.0)
Monocytes Absolute: 0.7 10*3/uL (ref 0.1–1.0)
Monocytes Relative: 7.7 % (ref 3.0–12.0)
Neutro Abs: 5.8 10*3/uL (ref 1.4–7.7)
Neutrophils Relative %: 66.3 % (ref 43.0–77.0)
Platelets: 242 10*3/uL (ref 150.0–400.0)
RBC: 4.17 Mil/uL (ref 3.87–5.11)
RDW: 17.8 % — ABNORMAL HIGH (ref 11.5–15.5)
WBC: 8.8 10*3/uL (ref 4.0–10.5)

## 2024-01-04 LAB — COMPREHENSIVE METABOLIC PANEL WITH GFR
ALT: 25 U/L (ref 0–35)
AST: 15 U/L (ref 0–37)
Albumin: 3.7 g/dL (ref 3.5–5.2)
Alkaline Phosphatase: 82 U/L (ref 39–117)
BUN: 20 mg/dL (ref 6–23)
CO2: 33 meq/L — ABNORMAL HIGH (ref 19–32)
Calcium: 9.4 mg/dL (ref 8.4–10.5)
Chloride: 102 meq/L (ref 96–112)
Creatinine, Ser: 0.97 mg/dL (ref 0.40–1.20)
GFR: 60.95 mL/min (ref >60.00)
Glucose, Bld: 111 mg/dL — ABNORMAL HIGH (ref 70–99)
Potassium: 4.2 meq/L (ref 3.5–5.1)
Sodium: 143 meq/L (ref 135–145)
Total Bilirubin: 0.8 mg/dL (ref 0.2–1.2)
Total Protein: 7.3 g/dL (ref 6.0–8.3)

## 2024-01-04 LAB — D-DIMER, QUANTITATIVE: D-Dimer, Quant: 2.35 ug{FEU}/mL — ABNORMAL HIGH (ref <0.50)

## 2024-01-04 LAB — HEMOGLOBIN A1C: Hgb A1c MFr Bld: 6.9 % — ABNORMAL HIGH (ref 4.6–6.5)

## 2024-01-04 MED ORDER — GLIPIZIDE 5 MG PO TABS: 5.0000 mg | ORAL_TABLET | Freq: Every day | ORAL | 1 refills | Status: DC

## 2024-01-04 MED ORDER — ALBUTEROL SULFATE HFA 108 (90 BASE) MCG/ACT IN AERS: 2.0000 | INHALATION_SPRAY | Freq: Four times a day (QID) | RESPIRATORY_TRACT | 0 refills | Status: DC | PRN

## 2024-01-04 MED ORDER — LOSARTAN POTASSIUM 50 MG PO TABS: 50.0000 mg | ORAL_TABLET | Freq: Every day | ORAL | 1 refills | Status: DC

## 2024-01-04 MED ORDER — FUROSEMIDE 20 MG PO TABS
20.0000 mg | ORAL_TABLET | Freq: Two times a day (BID) | ORAL | 1 refills | Status: DC
Start: 2024-01-04 — End: 2024-01-11

## 2024-01-04 MED ORDER — PREDNISONE 20 MG PO TABS: ORAL_TABLET | ORAL | 0 refills | Status: DC

## 2024-01-04 MED ORDER — AMLODIPINE BESYLATE 10 MG PO TABS: 10.0000 mg | ORAL_TABLET | Freq: Every day | ORAL | 1 refills | Status: DC

## 2024-01-04 MED ORDER — IPRATROPIUM-ALBUTEROL 0.5-2.5 (3) MG/3ML IN SOLN
3.0000 mL | RESPIRATORY_TRACT | Status: AC
  Administered 2024-01-04: 3 mL via RESPIRATORY_TRACT

## 2024-01-04 MED ORDER — DICLOFENAC SODIUM 75 MG PO TBEC: 75.0000 mg | DELAYED_RELEASE_TABLET | Freq: Two times a day (BID) | ORAL | 1 refills | Status: DC

## 2024-01-04 MED ORDER — POTASSIUM CHLORIDE CRYS ER 20 MEQ PO TBCR: 20.0000 meq | EXTENDED_RELEASE_TABLET | Freq: Two times a day (BID) | ORAL | 0 refills | Status: DC

## 2024-01-04 NOTE — Patient Instructions (Signed)

## 2024-01-04 NOTE — Telephone Encounter (Signed)
 Please inform patient Blood cell counts are stable, no signs of infection or anemia Kidney function and electrolytes are normal A1c did slightly increase up to 6.9, increase glipizide  to 1 tab daily.  D-dimer pending   Assuming D-dimer is normal, follow-up in 1 week.  If D-dimer is positive we will need to go to the ED

## 2024-01-04 NOTE — Progress Notes (Signed)
 Patient ID: Mckenzie [name], female  DOB: Apr 30, 1956, 68 y.o.   MRN: [id number] Patient Care Team    Relationship Specialty Notifications Start End  Mariel Shope, DO PCP - General Family Medicine  03/25/21   Milford Allegra, MD Consulting Physician Obstetrics and Gynecology  03/30/19   Tami Falcon, MD Consulting Physician Gastroenterology  03/30/19   Evalene Hilda, OD  Ophthalmology  09/15/21     Chief Complaint  Patient presents with   Diabetes   SOB worse after  Lightheaded dizzy sob 5 days ago in [place], worsened after home Cough "Took covid test"- Friday Not feeling well  No fever, chills, no diarrhea Subjective: Mckenzie [name] is a 68 y.o.  female present for chronic medical condition management All past medical history, surgical history, allergies, family history, immunizations, medications and social history were updated in the electronic medical record today. All recent labs, ED visits and hospitalizations within the last year were reviewed.  Patient presents for chronic condition management appointment with new dyspnea symptoms.  She states she is extremely short of breath, even on between the car and the building today.  Pulse oximetry levels were significantly low at 88% initially.  Patient reports she would just returned from a flight/trip to [place].  She noticed she was feeling lightheaded dizzy, along with mild cough and shortness of breath before leaving [place], but worsened over 5 days ago when she got home and patient does travel frequently for her job. She reports she has been extremely tired and not feeling right.  She did take a COVID test last Friday and it was negative.  Denies any fevers or chills.  She denies any signs of illness or viral illness such as diarrhea or respiratory infection.  She has no personal history of blood clots.  No family history of blood clots.  No personal history of lung disease, not a smoker. She denies palpitations,  blood per rectum, lower extremity edema, chest pain or syncope  Essential hypertension/HLD/morbid obesity Pt reports compliance with amlodipine  10 mg daily, Lipitor 40 mg daily, losartan  50 mg daily.  Patient denies chest pain, shortness of breath, dizziness or lower extremity edema.  Pt does not take a daily baby ASA. Pt is  prescribed statin. RF: Hypertension, hyperlipidemia, diabetes, obesity, family history of heart disease  Chronic idiopathic gout involving toe of left foot without tophus Patient reports compliance with allopurinol  300 mg daily to keep her gout controlled.   She uses colchicine  or steroids with acute flares.  She states she knows she does not follow a gout friendly diet since she loves shellfish and asparagus.  Fever blister Patient reports she gets fever blisters intermittently for the past 2 years.  She likes to have the Valtrex  on hand to use if needed.  type 2 diabetes mellitus with hyperlipidemia (HCC) Patient reports compliance with glipizide  2.5 mg. She was tried on Rybelsus  in the past, but cost was prohibitive. She knows she would not be able to tolerate metformin  if she did  need medication secondary to GI side effects.  Patient denies dizziness, hyperglycemic or hypoglycemic events. Patient denies numbness, tingling in the extremities or nonhealing wounds of feet.   Pain/arthritis: Patient reports compliance diclofenac  BID and feels it is working pretty good for her. Has used baclofen  on a few occasions when needed.  Tramadol  50-100 mg is not working well to control her pain at night.  Prior note: Pt presents for an OV with complaints of  difficulty staying asleep secondary to bilateral shoulder pain.  She reports the shoulder pain has been worsening over the last 2 months.  She has been unable to sleep in her bed secondary to discomfort.  She states she is sleeping enough that she falls asleep but as soon as she tries to move or get comfortable, usually  about an hour into her sleep, she wakes secondary to pain. She did have a positive ANA that was a very low titer with a negative rheumatoid factor and negative CCP.  Reflex panel was normal.  Sed rate had been elevated to 57.  She has a prednisone  prescription in pocket for flares, and she is worried about using it because she has 2 very important conferences throughout the year that she needs to make sure she can get to, and she is fearful she will need to prednisone  during those conferences. She also reports she is not being as active as she used to be secondary to pain.     02/18/2023    9:28 AM 08/17/2022    1:15 PM 09/15/2021    1:49 PM 08/14/2020   11:32 AM 04/01/2020   11:19 AM  Depression screen PHQ 2/9  Decreased Interest 0 0 0 0 0  Down, Depressed, Hopeless 0 0 0 0 0  PHQ - 2 Score 0 0 0 0 0  Altered sleeping   0    Tired, decreased energy   1    Change in appetite   0    Feeling bad or failure about yourself    0    Trouble concentrating   0    Moving slowly or fidgety/restless   0    Suicidal thoughts   0    PHQ-9 Score   1        09/15/2021    1:49 PM  GAD 7 : Generalized Anxiety Score  Nervous, Anxious, on Edge 0  Control/stop worrying 0  Worry too much - different things 0  Trouble relaxing 0  Restless 0  Easily annoyed or irritable 0  Afraid - awful might happen 0  Total GAD 7 Score 0          08/23/2023    7:15 PM 02/18/2023    9:28 AM 08/16/2022   10:30 PM 08/14/2020   11:32 AM 04/01/2020   11:19 AM  Fall Risk   Falls in the past year? 0 0 0 0 0  Number falls in past yr:  0  0 0  Injury with Fall?  0  0 0  Risk for fall due to :  No Fall Risks     Follow up  Falls evaluation completed  Falls evaluation completed Falls evaluation completed   Immunization History  Administered Date(s) Administered   PFIZER(Purple Top)SARS-COV-2 Vaccination 11/16/2019, 12/12/2019, 06/07/2020, 12/24/2020   PNEUMOCOCCAL CONJUGATE-20 08/17/2022   Pfizer(Comirnaty)Fall  Seasonal Vaccine 12 years and older 08/06/2022   Tdap 07/09/2015   Zoster Recombinant(Shingrix) 03/04/2022, 09/02/2022    No results found.  Past Medical History:  Diagnosis Date   Arthritis    Chicken pox    Diabetes mellitus without complication (HCC)    Gout    Hypertension    Myalgia 12/02/2021   Polyarthralgia 12/02/2021   UTI (urinary tract infection)    No Known Allergies Past Surgical History:  Procedure Laterality Date   NO PAST SURGERIES     Family History  Problem Relation Age of Onset   Heart failure Mother  Alcohol abuse Father    COPD Sister    Liver cancer Sister    Alcohol abuse Sister    Nocturnal enuresis Sister    Kidney disease Maternal Grandmother    BRCA 1/2 Neg Hx    Social History   Social History Narrative   Marital status:  Single. 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  Speaking engagements.   Safety:      -Wears a bicycle helmet riding a bike: Yes     -smoke alarm in the home:Yes     - Feels safe in their relationships: Yes          Allergies as of 01/04/2024   No Known Allergies      Medication List        Accurate as of January 04, 2024  3:20 PM. If you have any questions, ask your nurse or doctor.          albuterol 108 (90 Base) MCG/ACT inhaler Commonly known as: VENTOLIN HFA Inhale 2 puffs into the lungs every 6 (six) hours as needed for wheezing or shortness of breath. Started by: Sparsh Callens   allopurinol  300 MG tablet Commonly known as: ZYLOPRIM  Take 1 tablet (300 mg total) by mouth daily.   amLODipine  10 MG tablet Commonly known as: NORVASC  Take 1 tablet (10 mg total) by mouth daily.   atorvastatin  40 MG tablet Commonly known as: LIPITOR Take 1 tablet (40 mg total) by mouth daily.   baclofen  20 MG tablet Commonly known as: LIORESAL  Take 1 tablet (20 mg total) by mouth 3 (three) times daily.   colchicine  0.6 MG tablet Take 1 tablet (0.6 mg total) by mouth 2  (two) times daily as needed.   diclofenac  75 MG EC tablet Commonly known as: VOLTAREN  Take 1 tablet (75 mg total) by mouth 2 (two) times daily.   Fish Oil 500 MG Caps Take by mouth.   furosemide  20 MG tablet Commonly known as: LASIX  Take 1 tablet (20 mg total) by mouth daily as needed.   glipiZIDE  5 MG tablet Commonly known as: GLUCOTROL  Take 0.5 tablets (2.5 mg total) by mouth daily before breakfast.   JOINT HEALTH PO Take by mouth.   losartan  50 MG tablet Commonly known as: COZAAR  Take 1 tablet (50 mg total) by mouth daily.   multivitamin capsule Take 1 capsule by mouth daily.   predniSONE  20 MG tablet Commonly known as: DELTASONE  60 mg x3d, 40 mg x3d, 20 mg x2d, 10 mg x2d What changed: additional instructions Changed by: Napolean Backbone   PROBIOTIC BLEND PO Take by mouth.   traMADol  50 MG tablet Commonly known as: ULTRAM  1 tab PO in the day and 2 tabs qhs   valACYclovir  1000 MG tablet Commonly known as: VALTREX  2 po at outbreak onset then repeat in 12h for each outbreak        All past medical history, surgical history, allergies, family history, immunizations andmedications were updated in the EMR today and reviewed under the history and medication portions of their EMR.     ROS: 14 pt review of systems performed and negative (unless mentioned in an HPI)  Objective: BP 132/82   Pulse 75   Temp 97.8 F (36.6 C)   SpO2 98%  Physical Exam Vitals and nursing note reviewed.  Constitutional:      General: She is not in acute distress.    Appearance: Normal appearance. She is obese. She is not ill-appearing, toxic-appearing or diaphoretic.  HENT:     Head: Normocephalic and atraumatic.  Eyes:     General: No scleral icterus.       Right eye: No discharge.        Left eye: No discharge.     Extraocular Movements: Extraocular movements intact.     Conjunctiva/sclera: Conjunctivae normal.     Pupils: Pupils are equal, round, and reactive to light.   Cardiovascular:     Rate and Rhythm: Normal rate and regular rhythm.     Heart sounds: No murmur heard. Pulmonary:     Effort: Pulmonary effort is normal. No respiratory distress.     Breath sounds: No wheezing, rhonchi or rales.     Comments: Diminished air sounds/movement bilaterally Musculoskeletal:        General: No swelling or tenderness.     Cervical back: Neck supple. No tenderness.     Right lower leg: No edema.     Left lower leg: No edema.     Comments: Negative Homans bilaterally  Lymphadenopathy:     Cervical: No cervical adenopathy.  Skin:    General: Skin is warm and dry.     Coloration: Skin is not jaundiced or pale.     Findings: No erythema or rash.  Neurological:     Mental Status: She is alert and oriented to person, place, and time. Mental status is at baseline.     Motor: No weakness.     Gait: Gait normal.  Psychiatric:        Mood and Affect: Mood normal.        Behavior: Behavior normal.        Thought Content: Thought content normal.        Judgment: Judgment normal.    Diabetic Foot Exam - Simple   Simple Foot Form Diabetic Foot exam was performed with the following findings: Yes 01/04/2024  3:12 PM  Visual Inspection No deformities, no ulcerations, no other skin breakdown bilaterally: Yes Sensation Testing Intact to touch and monofilament testing bilaterally: Yes Pulse Check Comments      Assessment/plan: Mckenzie [name] is a 68 y.o. female present for chronic condition follow-up Essential hypertension/HLD Stable goal is <130/80 with her history of diabetes. Continue amlodipine  10 mg daily Continue losartan  to 50 mg daily. Continue atorvastatin  40 mg daily.  Higher doses made her sick. Could consider  Continue Lasix  20 mg daily as needed as needed only for edema. LDL goal<70 if able. . Low-sodium diet. Continue to exercise.  Fever blister Valtrex  as needed prescribed for her.  Refilled  type 2 diabetes mellitus (HCC) with  hyperlipidemia/Morbid obesity (HCC)/BMI 50.0-59.9, adult (HCC) Stable Continue glipizide  2.5 QD LDL goal <70 Continue atorvastatin  40 mg nightly Atorvastatin  40 mg daily - PNA series: PNA20.(After 65)-completed 2023 Flu shot: Declined (recommneded yearly) Foot exam: UTD 01/04/2024 Eye exam: completed 02/16/2023 vision source [place] A1c 6.1>6.8 >7.1> 6.9> 6.5 > 6.6 >A1c collected today.   Idiopathic chronic gout of left foot without tophus Stable Continue allopurinol  300 mg daily Uric acid goal < 6- at goal.  Struggles with low purine diet. Has a prednisone  taper on hand in the event of flare  Osteoarthritis (shoulder pain worse bilateral) Stable Next step would refer to orthopedics for further evaluation if pain worsens baclofen  prn Continue diclofenac  BID  Dyspnea: Afebrile, blood pressure/heart rate stable New onset dyspnea noted over a week ago, worsened over the last 5 days since returning from the flight from [place].  Patient does travel consistently  for her job. Initial oxygen saturation 88%.  Decreased to 84% when talking. Diminished air movement on exam today with decreased oxygen saturations.  DuoNeb treatment was provided and patient felt that she could breathe deeper.  Oxygen saturations when speaking came up to 88%, and at rest 97% Oxygen saturations are concerning, she does not have a history of lung disease, non-smoker.  No signs of infection today CBC, CMP and D-dimer stat ordered Patient declined CTA at this time, she is extremely fearful of enclosed spaces and her weight is overall 400 pounds therefore we need a specific imaging location to accommodate. Patient elected to wait until lab results are received.  If D-dimer is positive, she understands she will likely need to go to the emergency room to have CTA completed.  They can get for medication to help her with her tolerate the imaging. Patient understands the risks of not close CTA done stat today. Chest  x-ray ordered When she responded to albuterol treatment, albuterol inhaler prescribed with instructions on proper use. Prednisone  taper prescribed Patient will be called with lab and x-ray results.  52 minutes spent during patient encounte managing multiple chronic conditions and new acute emergent condition requiring stat and imaging  Return in about 24 weeks (around 06/20/2024).  Orders Placed This Encounter  Procedures   DG Chest 2 View   D-Dimer, Quantitative   CBC w/Diff   Comp Met (CMET)   Hemoglobin A1c   Meds ordered this encounter  Medications   ipratropium-albuterol (DUONEB) 0.5-2.5 (3) MG/3ML nebulizer solution 3 mL   predniSONE  (DELTASONE ) 20 MG tablet    Sig: 60 mg x3d, 40 mg x3d, 20 mg x2d, 10 mg x2d    Dispense:  18 tablet    Refill:  0   albuterol (VENTOLIN HFA) 108 (90 Base) MCG/ACT inhaler    Sig: Inhale 2 puffs into the lungs every 6 (six) hours as needed for wheezing or shortness of breath.    Dispense:  8 g    Refill:  0    Referral Orders  No referral(s) requested today      Note is dictated utilizing voice recognition software. Although note has been proof read prior to signing, occasional typographical errors still can be missed. If any questions arise, please do not hesitate to call for verification.  Electronically signed by: Napolean Backbone, DO [facility] [place]

## 2024-01-04 NOTE — Telephone Encounter (Signed)
 LM for pt to return call to discuss.

## 2024-01-04 NOTE — Telephone Encounter (Signed)
 Please call patient Her chest x-ray does not show signs of infection, but does show areas of possible fluid on her lungs.  I have called in Lasix  (the diuretic she was on before) and a potassium supplement for her to take both tabs together, in the morning and early afternoon for the next 3 days.   We will call her with the lab results once we have received them and discuss follow-up on acute on condition if needed.

## 2024-01-05 ENCOUNTER — Inpatient Hospital Stay (HOSPITAL_COMMUNITY)
Admission: EM | Admit: 2024-01-05 | Discharge: 2024-01-07 | DRG: 175 | Disposition: A | Discharge status: Home / Self Care | Payer: MEDICAID | Source: Ambulatory Visit | Attending: Student | Admitting: Student

## 2024-01-05 ENCOUNTER — Emergency Department (HOSPITAL_COMMUNITY): Payer: Self-pay

## 2024-01-05 ENCOUNTER — Telehealth: Payer: Self-pay | Admitting: Family Medicine

## 2024-01-05 DIAGNOSIS — E1169 Type 2 diabetes mellitus with other specified complication: Secondary | ICD-10-CM | POA: Diagnosis not present

## 2024-01-05 DIAGNOSIS — I2609 Other pulmonary embolism with acute cor pulmonale: Secondary | ICD-10-CM | POA: Diagnosis present

## 2024-01-05 DIAGNOSIS — M109 Gout, unspecified: Secondary | ICD-10-CM | POA: Diagnosis present

## 2024-01-05 DIAGNOSIS — Z6841 Body Mass Index (BMI) 40.0 and over, adult: Secondary | ICD-10-CM

## 2024-01-05 DIAGNOSIS — Z8249 Family history of ischemic heart disease and other diseases of the circulatory system: Secondary | ICD-10-CM

## 2024-01-05 DIAGNOSIS — I10 Essential (primary) hypertension: Secondary | ICD-10-CM | POA: Diagnosis present

## 2024-01-05 DIAGNOSIS — R110 Nausea: Secondary | ICD-10-CM | POA: Diagnosis present

## 2024-01-05 DIAGNOSIS — E785 Hyperlipidemia, unspecified: Secondary | ICD-10-CM | POA: Diagnosis present

## 2024-01-05 DIAGNOSIS — Z800 Family history of malignant neoplasm of digestive organs: Secondary | ICD-10-CM

## 2024-01-05 DIAGNOSIS — R609 Edema, unspecified: Secondary | ICD-10-CM | POA: Diagnosis present

## 2024-01-05 DIAGNOSIS — M2550 Pain in unspecified joint: Secondary | ICD-10-CM | POA: Diagnosis present

## 2024-01-05 DIAGNOSIS — Z7984 Long term (current) use of oral hypoglycemic drugs: Secondary | ICD-10-CM | POA: Diagnosis not present

## 2024-01-05 DIAGNOSIS — Z811 Family history of alcohol abuse and dependence: Secondary | ICD-10-CM

## 2024-01-05 DIAGNOSIS — Z713 Dietary counseling and surveillance: Secondary | ICD-10-CM | POA: Diagnosis not present

## 2024-01-05 DIAGNOSIS — M7989 Other specified soft tissue disorders: Secondary | ICD-10-CM | POA: Diagnosis not present

## 2024-01-05 DIAGNOSIS — I2602 Saddle embolus of pulmonary artery with acute cor pulmonale: Secondary | ICD-10-CM | POA: Diagnosis not present

## 2024-01-05 DIAGNOSIS — Z79899 Other long term (current) drug therapy: Secondary | ICD-10-CM

## 2024-01-05 DIAGNOSIS — Z825 Family history of asthma and other chronic lower respiratory diseases: Secondary | ICD-10-CM | POA: Diagnosis not present

## 2024-01-05 DIAGNOSIS — I2603 Cement embolism of pulmonary artery with acute cor pulmonale: Principal | ICD-10-CM

## 2024-01-05 DIAGNOSIS — R0902 Hypoxemia: Secondary | ICD-10-CM | POA: Diagnosis present

## 2024-01-05 DIAGNOSIS — M1A0720 Idiopathic chronic gout, left ankle and foot, without tophus (tophi): Secondary | ICD-10-CM | POA: Diagnosis not present

## 2024-01-05 DIAGNOSIS — T8481XA Embolism due to internal orthopedic prosthetic devices, implants and grafts, initial encounter: Principal | ICD-10-CM

## 2024-01-05 DIAGNOSIS — E119 Type 2 diabetes mellitus without complications: Secondary | ICD-10-CM | POA: Diagnosis present

## 2024-01-05 DIAGNOSIS — D72829 Elevated white blood cell count, unspecified: Secondary | ICD-10-CM | POA: Diagnosis present

## 2024-01-05 DIAGNOSIS — Z86711 Personal history of pulmonary embolism: Secondary | ICD-10-CM | POA: Diagnosis present

## 2024-01-05 LAB — CBC
HCT: 39.7 % (ref 36.0–46.0)
Hemoglobin: 12.4 g/dL (ref 12.0–15.0)
MCH: 27.9 pg (ref 26.0–34.0)
MCHC: 31.2 g/dL (ref 30.0–36.0)
MCV: 89.4 fL (ref 80.0–100.0)
Platelets: 280 10*3/uL (ref 150–400)
RBC: 4.44 MIL/uL (ref 3.87–5.11)
RDW: 16.5 % — ABNORMAL HIGH (ref 11.5–15.5)
WBC: 12.7 10*3/uL — ABNORMAL HIGH (ref 4.0–10.5)
nRBC: 0 % (ref 0.0–0.2)

## 2024-01-05 LAB — BASIC METABOLIC PANEL WITH GFR
Anion gap: 12 (ref 5–15)
BUN: 25 mg/dL — ABNORMAL HIGH (ref 8–23)
CO2: 24 mmol/L (ref 22–32)
Calcium: 9.8 mg/dL (ref 8.9–10.3)
Chloride: 103 mmol/L (ref 98–111)
Creatinine, Ser: 1.03 mg/dL — ABNORMAL HIGH (ref 0.44–1.00)
GFR, Estimated: 60 mL/min — ABNORMAL LOW (ref >60)
Glucose, Bld: 126 mg/dL — ABNORMAL HIGH (ref 70–99)
Potassium: 4.2 mmol/L (ref 3.5–5.1)
Sodium: 139 mmol/L (ref 135–145)

## 2024-01-05 LAB — TROPONIN I (HIGH SENSITIVITY)
Troponin I (High Sensitivity): 13 ng/L (ref <18)
Troponin I (High Sensitivity): 13 ng/L (ref <18)

## 2024-01-05 LAB — BRAIN NATRIURETIC PEPTIDE: B Natriuretic Peptide: 179.6 pg/mL — ABNORMAL HIGH (ref 0.0–100.0)

## 2024-01-05 MED ORDER — HEPARIN BOLUS VIA INFUSION
7000.0000 [IU] | Freq: Once | INTRAVENOUS | Status: DC
  Filled 2024-01-05: qty 7000

## 2024-01-05 MED ORDER — HEPARIN BOLUS VIA INFUSION
6500.0000 [IU] | Freq: Once | INTRAVENOUS | Status: AC
  Administered 2024-01-05: 6500 [IU] via INTRAVENOUS
  Filled 2024-01-05: qty 6500

## 2024-01-05 MED ORDER — ONDANSETRON HCL 4 MG/2ML IJ SOLN: 4.0000 mg | Freq: Four times a day (QID) | INTRAMUSCULAR | Status: DC | PRN

## 2024-01-05 MED ORDER — INSULIN ASPART 100 UNIT/ML IJ SOLN: 0.0000 [IU] | Freq: Every day | INTRAMUSCULAR | Status: DC

## 2024-01-05 MED ORDER — MORPHINE SULFATE (PF) 2 MG/ML IV SOLN: 2.0000 mg | INTRAVENOUS | Status: DC | PRN

## 2024-01-05 MED ORDER — IOHEXOL 350 MG/ML SOLN
75.0000 mL | Freq: Once | INTRAVENOUS | Status: AC | PRN
  Administered 2024-01-05: 75 mL via INTRAVENOUS

## 2024-01-05 MED ORDER — ONDANSETRON HCL 4 MG PO TABS
4.0000 mg | ORAL_TABLET | Freq: Four times a day (QID) | ORAL | Status: DC | PRN
  Administered 2024-01-07: 4 mg via ORAL
  Filled 2024-01-05: qty 1

## 2024-01-05 MED ORDER — INSULIN ASPART 100 UNIT/ML IJ SOLN: 0.0000 [IU] | Freq: Three times a day (TID) | INTRAMUSCULAR | Status: DC

## 2024-01-05 MED ORDER — HEPARIN (PORCINE) 25000 UT/250ML-% IV SOLN
2000.0000 [IU]/h | INTRAVENOUS | Status: DC
  Administered 2024-01-05 – 2024-01-07 (×4): 2000 [IU]/h via INTRAVENOUS
  Filled 2024-01-05 (×4): qty 250

## 2024-01-05 NOTE — Telephone Encounter (Signed)
 Spoke with patient regarding results/recommendations.

## 2024-01-05 NOTE — H&P (Signed)
 History and Physical    Patient: Mckenzie [name] ZOX:[id number] DOB: [birth year] DOA: 01/05/2024 DOS: the patient was seen and examined on 01/05/2024 PCP: Mariel Shope, DO  Patient coming from: Home  Chief Complaint:  Chief Complaint  Patient presents with   Shortness of Breath   HPI: Mckenzie [name] is a 68 y.o. female with medical history significant of Diabetes, essential hypertension, morbid obesity, history of chickenpox, gout and polyarthralgia who has decrease mobility over the last 9 months due to nonspecific reasons but generally feeling unwell.  Patient recently traveled to [place]  via road.  She was also on a cruise in January.  She used to be very active but not recently.  Went to see her doctor due to exertional dyspnea and some mild chest pain.  Patient also has noted left lower extremity swelling.  D-dimer was checked yesterday by the PCP came back 2.35.  She was sent to the hospital for evaluation at which point CT angiogram of the chest showed bilateral PE.  Patient denied any ongoing chest pain.  She still has dyspnea on exertion but does not require oxygen at rest.  At this point patient has been initiated on heparin  and being admitted to the medical service for workup of acute pulmonary embolism.  No obvious trigger except for the decrease mobility.  No family history of thromboembolic phenomena.  Patient denied tobacco use or hormone therapy.  Review of Systems: As mentioned in the history of present illness. All other systems reviewed and are negative. Past Medical History:  Diagnosis Date   Arthritis    Chicken pox    Diabetes mellitus without complication (HCC)    Gout    Hypertension    Myalgia 12/02/2021   Polyarthralgia 12/02/2021   UTI (urinary tract infection)    Past Surgical History:  Procedure Laterality Date   NO PAST SURGERIES     Social History:  reports that she has never smoked. She has never been exposed to tobacco smoke. She has never used smokeless  tobacco. She reports current alcohol use. She reports that she does not currently use drugs.  No Known Allergies  Family History  Problem Relation Age of Onset   Heart failure Mother    Alcohol abuse Father    COPD Sister    Liver cancer Sister    Alcohol abuse Sister    Nocturnal enuresis Sister    Kidney disease Maternal Grandmother    BRCA 1/2 Neg Hx     Prior to Admission medications   Medication Sig Start Date End Date Taking? Authorizing Provider  albuterol  (VENTOLIN  HFA) 108 (90 Base) MCG/ACT inhaler Inhale 2 puffs into the lungs every 6 (six) hours as needed for wheezing or shortness of breath. 01/04/24   [name], [name], DO  allopurinol  (ZYLOPRIM ) 300 MG tablet Take 1 tablet (300 mg total) by mouth daily. 08/24/23   [name], [name], DO  amLODipine  (NORVASC ) 10 MG tablet Take 1 tablet (10 mg total) by mouth daily. 01/04/24   [name], [name], DO  atorvastatin  (LIPITOR) 40 MG tablet Take 1 tablet (40 mg total) by mouth daily. 08/24/23   [name], [name], DO  baclofen  (LIORESAL ) 20 MG tablet Take 1 tablet (20 mg total) by mouth 3 (three) times daily. 08/24/23   [name], [name], DO  colchicine  0.6 MG tablet Take 1 tablet (0.6 mg total) by mouth 2 (two) times daily as needed. 08/24/23   [name], [name], DO  diclofenac  (VOLTAREN ) 75 MG  EC tablet Take 1 tablet (75 mg total) by mouth 2 (two) times daily. 01/04/24   [name], [name], DO  furosemide  (LASIX ) 20 MG tablet Take 1 tablet (20 mg total) by mouth 2 (two) times daily. For 3 days then as needed 01/04/24   [name], [name], DO  glipiZIDE  (GLUCOTROL ) 5 MG tablet Take 1 tablet (5 mg total) by mouth daily before breakfast. 01/04/24   [name], [name], DO  losartan  (COZAAR ) 50 MG tablet Take 1 tablet (50 mg total) by mouth daily. 01/04/24   [name], [name], DO  Misc Natural Products (JOINT HEALTH PO) Take by mouth.    [provider]  Multiple Vitamin (MULTIVITAMIN) capsule Take 1 capsule by mouth daily.    [provider]   Omega-3 Fatty Acids (FISH OIL) 500 MG CAPS Take by mouth.    [provider]  potassium chloride  SA (KLOR-CON  M) 20 MEQ tablet Take 1 tablet (20 mEq total) by mouth 2 (two) times daily. With Lasix  tab as needed 01/04/24   [name], [name], DO  predniSONE  (DELTASONE ) 20 MG tablet 60 mg x3d, 40 mg x3d, 20 mg x2d, 10 mg x2d 01/04/24   [name], [name], DO  Probiotic Product (PROBIOTIC BLEND PO) Take by mouth.    [provider]  traMADol  (ULTRAM ) 50 MG tablet 1 tab PO in the day and 2 tabs qhs 08/24/23   [name], [name], DO  valACYclovir  (VALTREX ) 1000 MG tablet 2 po at outbreak onset then repeat in 12h for each outbreak 08/24/23   Mariel Shope, DO    Physical Exam: Vitals:   01/05/24 1416 01/05/24 1730  BP: (!) 144/88   Pulse: (!) 101   Resp: (!) 22   Temp: 98.8 F (37.1 C)   SpO2: 93%   Weight:  (!) 190.5 kg  Height:  6' (1.829 m)   Constitutional: Morbidly obese, pleasant NAD, calm, comfortable Eyes: PERRL, lids and conjunctivae normal ENMT: Mucous membranes are moist. Posterior pharynx clear of any exudate or lesions.Normal dentition.  Neck: normal, supple, no masses, no thyromegaly Respiratory: Coarse breath sounds bilaterally, no wheezing, no crackles. Normal respiratory effort. No accessory muscle use.  Cardiovascular: Sinus tachycardia, no murmurs / rubs / gallops. No extremity edema. 2+ pedal pulses. No carotid bruits.  Abdomen: no tenderness, no masses palpated. No hepatosplenomegaly. Bowel sounds positive.  Musculoskeletal: Good range of motion, no joint swelling or tenderness, Skin: no rashes, lesions, ulcers. No induration Neurologic: CN 2-12 grossly intact. Sensation intact, DTR normal. Strength 5/5 in all 4.  Psychiatric: Normal judgment and insight. Alert and oriented x 3. Normal mood  Data Reviewed:  Patient afebrile.  Blood pressure 144/88, pulse 101 respirate 22, White count 12.7, creatinine 1.03 BUN 25, BNP 179 glucose 126, Chest x-ray showed  findings of congestive heart failure with pulmonary edema CT angio of the chest showed bilateral pulmonary emboli.  CT evidence of right heart strain with RV LV ratio 1.6 consistent with at least submassive PE  Assessment and Plan:  #1 bilateral pulmonary emboli: Patient showing signs of right heart strain.  Will admit the patient.  Continue IV heparin .  Will get echocardiogram and evaluate for possible thrombectomy.  Patient also will get Doppler ultrasound of the lower extremity to look for DVT.  If stable on the heparin  drip will transition to oral anticoagulation prior to discharge home.  #2 non-insulin -dependent diabetes: Hold oral hypoglycemics.  Sliding scale insulin   #3 essential hypertension: Confirm on resume home regimen  #  4 morbid obesity: Dietary counseling  #5 history of gout: No acute flareup.  #6 hyperlipidemia: Confirm on resume statin.    Advance Care Planning:   Code Status: Not on file full code  Consults: None  Family Communication: Sisters and daughter in the room  Severity of Illness: The appropriate patient status for this patient is INPATIENT. Inpatient status is judged to be reasonable and necessary in order to provide the required intensity of service to ensure the patient's safety. The patient's presenting symptoms, physical exam findings, and initial radiographic and laboratory data in the context of their chronic comorbidities is felt to place them at high risk for further clinical deterioration. Furthermore, it is not anticipated that the patient will be medically stable for discharge from the hospital within 2 midnights of admission.   * I certify that at the point of admission it is my clinical judgment that the patient will require inpatient hospital care spanning beyond 2 midnights from the point of admission due to high intensity of service, high risk for further deterioration and high frequency of surveillance required.*  AuthorCarolin Chyle,  MD 01/05/2024 6:28 PM  For on call review [email or web address].

## 2024-01-05 NOTE — ED Triage Notes (Signed)
 Pt c/o DOE for the last week. Pt denies leg swelling. No CP per pt. Pt had elevated D-dimer at 2.35 taken yesterday at her PCP. No hx of blood clots per pt. No blood thinners per pt.

## 2024-01-05 NOTE — Telephone Encounter (Signed)
 Please call pt: Her Dimer is significantly elevated to 2.35 (normal for her would be < 0.66). We attempted to call her yesterday to provide instructions on lasix  start, since her xray also showed pulmonary edema (which is fluid in her lungs)> with new findings of elevated D-Dimer she needs to go to an ED IMMEDIATELY for emergent tests.  These tests can not be completed in a clinic or urgent care.   It is extremely important she go to the ED. I know she is concerned about having a CT, they can help with that and provide her meds to relax her if needed. Not getting the workup and treatment, could cause devesting consequences to her health.

## 2024-01-05 NOTE — ED Provider Triage Note (Signed)
 Emergency Medicine Provider Triage Evaluation Note  Mckenzie [name] , a 68 y.o. female  was evaluated in triage.  Pt complains of shortness of breath. Patient reports ongoing SOB worsened with exertion for the last week. Denies hemoptysis. No history of PE or DVT. PCP had d-dimer checked which resulted elevated. Here for further evaluation of SOB.  Review of Systems  Positive: As above Negative: As above  Physical Exam  BP (!) 144/88 (BP Location: Right Arm)   Pulse (!) 101   Temp 98.8 F (37.1 C)   Resp (!) 22   SpO2 93%  Gen:   Awake, no distress  Resp:  Normal effort, no wheezing, rales, or rhonchi, and no obvious signs of increased work of breathing MSK:   Moves extremities without difficulty  Other:    Medical Decision Making  Medically screening exam initiated at 3:09 PM.  Appropriate orders placed.  Mckenzie [name] was informed that the remainder of the evaluation will be completed by another provider, this initial triage assessment does not replace that evaluation, and the importance of remaining in the ED until their evaluation is complete.     Mckenzie [name] A, PA-C 01/05/24 1511

## 2024-01-05 NOTE — Progress Notes (Addendum)
 ANTICOAGULATION CONSULT NOTE  Pharmacy Consult for Heparin  Indication: pulmonary embolus  No Known Allergies  Patient Measurements: Height: 6' (182.9 cm) Weight: (!) 190.5 kg (420 lb) IBW/kg (Calculated) : 73.1 Heparin  Dosing Weight: 121.1 KG  Vital Signs: Temp: 98.8 F (37.1 C) (05/01 1416) BP: 144/88 (05/01 1416) Pulse Rate: 101 (05/01 1416)  Labs: Recent Labs    01/04/24 1327 01/05/24 1427  HGB 11.8* 12.4  HCT 37.0 39.7  PLT 242.0 280  CREATININE 0.97 1.03*    Estimated Creatinine Clearance: 101.9 mL/min (A) (by C-G formula based on SCr of 1.03 mg/dL (H)).   Medical History: Past Medical History:  Diagnosis Date   Arthritis    Chicken pox    Diabetes mellitus without complication (HCC)    Gout    Hypertension    Myalgia 12/02/2021   Polyarthralgia 12/02/2021   UTI (urinary tract infection)     Medications:  (Not in a hospital admission)  Scheduled:  Infusions:  PRN:   Assessment: 89 yof with a history of HTN, morbid obesity, gout, T2DM. Patient with elevated D-Dimer from PCP visit seen for dyspnea. Heparin  per pharmacy consult placed for pulmonary embolus.  CTA PE w/ bilateral PE w/ RHS  Patient is not on anticoagulation prior to arrival.  Hgb 12.4; plt 280  Goal of Therapy:  Heparin  level 0.3-0.7 units/ml Monitor platelets by anticoagulation protocol: Yes   Plan:  Give IV heparin  6500 units bolus x 1 Start heparin  infusion at 2000 units/hr Check anti-Xa level in 8 hours and daily while on heparin  Continue to monitor H&H and platelets  Dionicio Fray, PharmD, BCPS 01/05/2024 5:30 PM ED Clinical Pharmacist -  [phone]

## 2024-01-05 NOTE — ED Provider Notes (Signed)
  EMERGENCY DEPARTMENT AT [facility] Provider Note   CSN: [id number] Arrival date & time: 01/05/24  1409     History  Chief Complaint  Patient presents with   Shortness of Breath    Mckenzie [name] is a 68 y.o. female.  This is a 68 year old female presenting emergency department for shortness of breath.  Reports worsening dyspnea on exertion, lightheadedness with exertion.  Symptoms been ongoing for the past week.  They started after travel, reportedly flew to [place].  No history of PE or DVT.  Her PCP checked D-dimer which was elevated.  She has no chest pain or shortness of breath currently sitting in the bed and notes her symptoms are with exertion.   Shortness of Breath      Home Medications Prior to Admission medications   Medication Sig Start Date End Date Taking? Authorizing Provider  albuterol  (VENTOLIN  HFA) 108 (90 Base) MCG/ACT inhaler Inhale 2 puffs into the lungs every 6 (six) hours as needed for wheezing or shortness of breath. 01/04/24   [name], [name], DO  allopurinol  (ZYLOPRIM ) 300 MG tablet Take 1 tablet (300 mg total) by mouth daily. 08/24/23   [name], [name], DO  amLODipine  (NORVASC ) 10 MG tablet Take 1 tablet (10 mg total) by mouth daily. 01/04/24   [name], [name], DO  atorvastatin  (LIPITOR) 40 MG tablet Take 1 tablet (40 mg total) by mouth daily. 08/24/23   [name], [name], DO  baclofen  (LIORESAL ) 20 MG tablet Take 1 tablet (20 mg total) by mouth 3 (three) times daily. 08/24/23   [name], [name], DO  colchicine  0.6 MG tablet Take 1 tablet (0.6 mg total) by mouth 2 (two) times daily as needed. 08/24/23   [name], [name], DO  diclofenac  (VOLTAREN ) 75 MG EC tablet Take 1 tablet (75 mg total) by mouth 2 (two) times daily. 01/04/24   [name], [name], DO  furosemide  (LASIX ) 20 MG tablet Take 1 tablet (20 mg total) by mouth 2 (two) times daily. For 3 days then as needed 01/04/24   [name], [name], DO  glipiZIDE  (GLUCOTROL ) 5 MG tablet Take 1  tablet (5 mg total) by mouth daily before breakfast. 01/04/24   [name], [name], DO  losartan  (COZAAR ) 50 MG tablet Take 1 tablet (50 mg total) by mouth daily. 01/04/24   [name], [name], DO  Misc Natural Products (JOINT HEALTH PO) Take by mouth.    [provider]  Multiple Vitamin (MULTIVITAMIN) capsule Take 1 capsule by mouth daily.    [provider]  Omega-3 Fatty Acids (FISH OIL) 500 MG CAPS Take by mouth.    [provider]  potassium chloride  SA (KLOR-CON  M) 20 MEQ tablet Take 1 tablet (20 mEq total) by mouth 2 (two) times daily. With Lasix  tab as needed 01/04/24   [name], [name], DO  predniSONE  (DELTASONE ) 20 MG tablet 60 mg x3d, 40 mg x3d, 20 mg x2d, 10 mg x2d 01/04/24   [name], [name], DO  Probiotic Product (PROBIOTIC BLEND PO) Take by mouth.    [provider]  traMADol  (ULTRAM ) 50 MG tablet 1 tab PO in the day and 2 tabs qhs 08/24/23   [name], [name], DO  valACYclovir  (VALTREX ) 1000 MG tablet 2 po at outbreak onset then repeat in 12h for each outbreak 08/24/23   Marylee Snowball, [name], DO      Allergies    Patient has no known allergies.    Review of Systems  Review of Systems  Respiratory:  Positive for shortness of breath.     Physical Exam Updated Vital Signs BP (!) 144/88 (BP Location: Right Arm)   Pulse (!) 101   Temp 98.8 F (37.1 C)   Resp (!) 22   Ht 6' (1.829 m)   Wt (!) 190.5 kg   SpO2 93%   BMI 56.96 kg/m  Physical Exam Vitals and nursing note reviewed.  Constitutional:      General: She is not in acute distress.    Appearance: She is obese. She is not toxic-appearing.  HENT:     Head: Normocephalic.  Cardiovascular:     Rate and Rhythm: Normal rate.  Pulmonary:     Effort: Pulmonary effort is normal. No respiratory distress.     Breath sounds: Normal breath sounds.  Abdominal:     Palpations: Abdomen is soft.  Musculoskeletal:     Cervical back: Normal range of motion.     Right lower leg: No edema.     Left lower  leg: No edema.  Skin:    General: Skin is warm and dry.     Capillary Refill: Capillary refill takes less than 2 seconds.  Neurological:     General: No focal deficit present.     Mental Status: She is alert.  Psychiatric:        Mood and Affect: Mood normal.        Behavior: Behavior normal.     ED Results / Procedures / Treatments   Labs (all labs ordered are listed, but only abnormal results are displayed) Labs Reviewed  BASIC METABOLIC PANEL WITH GFR - Abnormal; Notable for the following components:      Result Value   Glucose, Bld 126 (*)    BUN 25 (*)    Creatinine, Ser 1.03 (*)    GFR, Estimated 60 (*)    All other components within normal limits  CBC - Abnormal; Notable for the following components:   WBC 12.7 (*)    RDW 16.5 (*)    All other components within normal limits  BRAIN NATRIURETIC PEPTIDE  HEPARIN  LEVEL (UNFRACTIONATED)  HEPARIN  LEVEL (UNFRACTIONATED)  TROPONIN I (HIGH SENSITIVITY)    EKG None  Radiology CT Angio Chest PE W and/or Wo Contrast Addendum Date: 01/05/2024 ADDENDUM REPORT: 01/05/2024 17:13 ADDENDUM: Critical Value/emergent results were called by telephone at the time of interpretation on 01/05/2024 at 5:13 pm to provider OSCAR ZELAYA , who verbally acknowledged these results. Electronically Signed   By: Rosalene Colon M.D.   On: 01/05/2024 17:13   Result Date: 01/05/2024 CLINICAL DATA:  Shortness of breath.  Positive D-dimer. EXAM: CT ANGIOGRAPHY CHEST WITH CONTRAST TECHNIQUE: Multidetector CT imaging of the chest was performed using the standard protocol during bolus administration of intravenous contrast. Multiplanar CT image reconstructions and MIPs were obtained to evaluate the vascular anatomy. RADIATION DOSE REDUCTION: This exam was performed according to the departmental dose-optimization program which includes automated exposure control, adjustment of the mA and/or kV according to patient size and/or use of iterative reconstruction  technique. CONTRAST:  75mL OMNIPAQUE  IOHEXOL  350 MG/ML SOLN COMPARISON:  None Available. FINDINGS: Cardiovascular: Large bilateral pulmonary emboli are noted in the upper and lower lobe branches. RV/LV ratio of 1.6 is noted suggesting right heart strain. No pericardial effusion. Mediastinum/Nodes: No enlarged mediastinal, hilar, or axillary lymph nodes. Thyroid gland, trachea, and esophagus demonstrate no significant findings. Lungs/Pleura: No pneumothorax or pleural effusion is noted. Mild patchy airspace opacities are noted  bilaterally which may represent mild multifocal inflammation. Upper Abdomen: No acute abnormality. Musculoskeletal: No chest wall abnormality. No acute or significant osseous findings. Review of the MIP images confirms the above findings. IMPRESSION: Bilateral pulmonary emboli are noted. Positive for acute PE with CT evidence of right heart strain (RV/LV Ratio = 1.6) consistent with at least submassive (intermediate risk) PE. The presence of right heart strain has been associated with an increased risk of morbidity and mortality. Please refer to the "Code PE Focused" order set in EPIC. An attempt was made to contact the ordering provider regarding this critical finding. The provider cannot be reached but a voicemail was left on their phone. This will be converted to a call report. Mild patchy airspace opacities are noted bilaterally suggesting mild multifocal inflammation. Electronically Signed: By: Rosalene Colon M.D. On: 01/05/2024 17:01   DG Chest 2 View Result Date: 01/05/2024 CLINICAL DATA:  Shortness of breath. EXAM: CHEST - 2 VIEW COMPARISON:  01/04/2024. FINDINGS: Low lung volume. There is diffuse moderate pulmonary vascular congestion with bilateral hilar and bibasilar predominance. Bilateral lung fields are otherwise clear. No acute consolidation or lung collapse. Bilateral costophrenic angles are clear. Mildly enlarged cardio-mediastinal silhouette. No acute osseous abnormalities.  The soft tissues are within normal limits. IMPRESSION: Findings favor congestive heart failure/pulmonary edema. Electronically Signed   By: Beula Brunswick M.D.   On: 01/05/2024 15:04   DG Chest 2 View Result Date: 01/04/2024 CLINICAL DATA:  Dyspnea. EXAM: CHEST - 2 VIEW COMPARISON:  Jan 12, 2018. FINDINGS: Mild cardiomegaly is noted with central pulmonary vascular congestion. Minimal pulmonary edema may be present. Bony thorax is unremarkable. IMPRESSION: Mild cardiomegaly with central pulmonary vascular congestion and possible minimal pulmonary edema. Electronically Signed   By: Rosalene Colon M.D.   On: 01/04/2024 14:51    Procedures .Critical Care  Performed by: Rolinda Climes, DO Authorized by: Rolinda Climes, DO   Critical care provider statement:    Critical care time (minutes):  30   Critical care was necessary to treat or prevent imminent or life-threatening deterioration of the following conditions:  Respiratory failure and cardiac failure   Critical care was time spent personally by me on the following activities:  Development of treatment plan with patient or surrogate, discussions with consultants, evaluation of patient's response to treatment, examination of patient, ordering and review of laboratory studies, ordering and review of radiographic studies, ordering and performing treatments and interventions, pulse oximetry, re-evaluation of patient's condition and review of old charts   Care discussed with: admitting provider       Medications Ordered in ED Medications  heparin  ADULT infusion 100 units/mL (25000 units/250mL) (2,000 Units/hr Intravenous New Bag/Given 01/05/24 1749)  iohexol  (OMNIPAQUE ) 350 MG/ML injection 75 mL (75 mLs Intravenous Contrast Given 01/05/24 1642)  heparin  bolus via infusion 6,500 Units (6,500 Units Intravenous Bolus from Bag 01/05/24 1749)    ED Course/ Medical Decision Making/ A&P Clinical Course as of 01/05/24 1757  Thu Jan 05, 2024  1722 CT Angio  Chest PE W and/or Wo Contrast IMPRESSION: Bilateral pulmonary emboli are noted. Positive for acute PE with CT evidence of right heart strain (RV/LV Ratio = 1.6) consistent with at least submassive (intermediate risk) PE. The presence of right heart strain has been associated with an increased risk of morbidity and mortality. Please refer to the "Code PE Focused" order set in EPIC. An attempt was made to contact the ordering provider regarding this critical finding. The provider cannot be  reached but a voicemail was left on their phone. This will be converted to a call report.   [TY]  1723 DG Chest 2 View IMPRESSION: Findings favor congestive heart failure/pulmonary edema.   [TY]    Clinical Course User Index [TY] Rolinda Climes, DO                                 Medical Decision Making 68 year old female history of obesity, diabetes, hypertension presenting the emergency department for evaluation of elevated D-dimer in the setting of dyspnea on exertion.  She is slightly tachycardic, mildly tachypneic.  Oxygen saturation in the mid 90s.  Does not appear to be in respiratory distress on exam.  Workup shows bilateral PE with right heart strain on patient's CTA mild leukocytosis.  Normal electrolytes and mild elevation in renal function.  Per chart review.  That she was hypoxic in PCPs office yesterday 88%.  Family and friends at bedside notes that she has been on steroids for the past 2 days.  Added on troponin and BNP given CT scan findings of right heart strain.  Given patient's numerous comorbidities with CT scan showing multiple PEs with right heart strain ordered heparin .  Will admit for anticoagulation and further monitoring.  Amount and/or Complexity of Data Reviewed Labs: ordered. Radiology: ordered. Decision-making details documented in ED Course.  Risk Prescription drug management.         Final Clinical Impression(s) / ED Diagnoses Final diagnoses:  None    Rx  / DC Orders ED Discharge Orders     None         Rolinda Climes, DO 01/05/24 1757

## 2024-01-05 NOTE — ED Notes (Signed)
 Pt refused blood sugar check.

## 2024-01-06 ENCOUNTER — Inpatient Hospital Stay (HOSPITAL_COMMUNITY): Payer: Self-pay

## 2024-01-06 ENCOUNTER — Other Ambulatory Visit (HOSPITAL_COMMUNITY): Payer: Self-pay

## 2024-01-06 ENCOUNTER — Other Ambulatory Visit: Payer: Self-pay

## 2024-01-06 DIAGNOSIS — I2602 Saddle embolus of pulmonary artery with acute cor pulmonale: Secondary | ICD-10-CM

## 2024-01-06 DIAGNOSIS — I2609 Other pulmonary embolism with acute cor pulmonale: Secondary | ICD-10-CM | POA: Diagnosis not present

## 2024-01-06 DIAGNOSIS — I10 Essential (primary) hypertension: Secondary | ICD-10-CM | POA: Diagnosis not present

## 2024-01-06 LAB — ECHOCARDIOGRAM COMPLETE
AR max vel: 2.46 cm2
AV Area VTI: 2.23 cm2
AV Area mean vel: 2.53 cm2
AV Mean grad: 7 mmHg
AV Peak grad: 13.2 mmHg
Ao pk vel: 1.82 m/s
Area-P 1/2: 3.91 cm2
Height: 72 in
S' Lateral: 2.8 cm
Weight: 6720 [oz_av]

## 2024-01-06 LAB — HEPARIN LEVEL (UNFRACTIONATED)
Heparin Unfractionated: 0.46 [IU]/mL (ref 0.30–0.70)
Heparin Unfractionated: 0.46 [IU]/mL (ref 0.30–0.70)

## 2024-01-06 LAB — GLUCOSE, CAPILLARY
Glucose-Capillary: 104 mg/dL — ABNORMAL HIGH (ref 70–99)
Glucose-Capillary: 101 mg/dL — ABNORMAL HIGH (ref 70–99)
Glucose-Capillary: 128 mg/dL — ABNORMAL HIGH (ref 70–99)

## 2024-01-06 LAB — COMPREHENSIVE METABOLIC PANEL WITH GFR
ALT: 34 U/L (ref 0–44)
AST: 22 U/L (ref 15–41)
Albumin: 3.2 g/dL — ABNORMAL LOW (ref 3.5–5.0)
Alkaline Phosphatase: 66 U/L (ref 38–126)
Anion gap: 12 (ref 5–15)
BUN: 24 mg/dL — ABNORMAL HIGH (ref 8–23)
CO2: 26 mmol/L (ref 22–32)
Calcium: 9.6 mg/dL (ref 8.9–10.3)
Chloride: 102 mmol/L (ref 98–111)
Creatinine, Ser: 0.99 mg/dL (ref 0.44–1.00)
GFR, Estimated: >60 mL/min (ref >60)
Glucose, Bld: 96 mg/dL (ref 70–99)
Potassium: 4.5 mmol/L (ref 3.5–5.1)
Sodium: 140 mmol/L (ref 135–145)
Total Bilirubin: 0.6 mg/dL (ref 0.0–1.2)
Total Protein: 7.8 g/dL (ref 6.5–8.1)

## 2024-01-06 LAB — CBC
HCT: 38.4 % (ref 36.0–46.0)
Hemoglobin: 11.9 g/dL — ABNORMAL LOW (ref 12.0–15.0)
MCH: 27.8 pg (ref 26.0–34.0)
MCHC: 31 g/dL (ref 30.0–36.0)
MCV: 89.7 fL (ref 80.0–100.0)
Platelets: 300 10*3/uL (ref 150–400)
RBC: 4.28 MIL/uL (ref 3.87–5.11)
RDW: 16.7 % — ABNORMAL HIGH (ref 11.5–15.5)
WBC: 15.6 10*3/uL — ABNORMAL HIGH (ref 4.0–10.5)
nRBC: 0 % (ref 0.0–0.2)

## 2024-01-06 LAB — CBG MONITORING, ED: Glucose-Capillary: 96 mg/dL (ref 70–99)

## 2024-01-06 LAB — HIV ANTIBODY (ROUTINE TESTING W REFLEX): HIV Screen 4th Generation wRfx: NONREACTIVE

## 2024-01-06 MED ORDER — PERFLUTREN LIPID MICROSPHERE
1.0000 mL | INTRAVENOUS | Status: AC | PRN
  Administered 2024-01-06: 2 mL via INTRAVENOUS

## 2024-01-06 MED ORDER — ATORVASTATIN CALCIUM 40 MG PO TABS
40.0000 mg | ORAL_TABLET | Freq: Every day | ORAL | Status: DC
  Administered 2024-01-06 – 2024-01-07 (×2): 40 mg via ORAL
  Filled 2024-01-06 (×2): qty 1

## 2024-01-06 MED ORDER — ALLOPURINOL 300 MG PO TABS
300.0000 mg | ORAL_TABLET | Freq: Every day | ORAL | Status: DC
  Administered 2024-01-06 – 2024-01-07 (×2): 300 mg via ORAL
  Filled 2024-01-06: qty 1
  Filled 2024-01-06: qty 3

## 2024-01-06 MED ORDER — TRAMADOL HCL 50 MG PO TABS
50.0000 mg | ORAL_TABLET | Freq: Two times a day (BID) | ORAL | Status: DC | PRN
  Administered 2024-01-06 – 2024-01-07 (×2): 50 mg via ORAL
  Filled 2024-01-06 (×2): qty 1

## 2024-01-06 NOTE — Progress Notes (Signed)
 PHARMACY - ANTICOAGULATION CONSULT NOTE  Pharmacy Consult for heparin  Indication: pulmonary embolus  Labs: Recent Labs    01/04/24 1327 01/05/24 1427 01/05/24 1751 01/05/24 1926 01/06/24 0258 01/06/24 0959  HGB 11.8* 12.4  --   --  11.9*  --   HCT 37.0 39.7  --   --  38.4  --   PLT 242.0 280  --   --  300  --   HEPARINUNFRC  --   --   --   --  0.46 0.46  CREATININE 0.97 1.03*  --   --  0.99  --   TROPONINIHS  --   --  13 13  --   --    Assessment/Plan:  68yo female ,  HL 0.46, remains therapeutic x2  on heparin  with initial dosing for PE. Hgb 11.9  and pltc 300k stable. Will continue infusion at current rate of 2000 units/hr and confirm stable with additional level. Not on anticoagulation PTA.  Will f/u copay check for DOAC.   Alisa Irish, RPh Clinical Pharmacist 01/06/2024 1:55 PM    Please check AMION for all [facility] phone numbers After 10:00 PM, call Main Pharmacy [phone]

## 2024-01-06 NOTE — ED Notes (Signed)
 Pt states she is not a diabetic and does not take any medication at home for blood sugar and is refusing CBG checks and insulin  at this time. Admitting MD made aware

## 2024-01-06 NOTE — Progress Notes (Signed)
 Mobility Specialist Progress Note:   01/06/24 1145  Mobility  Activity Ambulated with assistance in hallway  Level of Assistance Standby assist, set-up cues, supervision of patient - no hands on  Assistive Device Other (Comment) (IV Pole)  Distance Ambulated (ft) 200 ft  Activity Response Tolerated well  Mobility Referral Yes  Mobility visit 1 Mobility  Mobility Specialist Start Time (ACUTE ONLY) 1145  Mobility Specialist Stop Time (ACUTE ONLY) 1157  Mobility Specialist Time Calculation (min) (ACUTE ONLY) 12 min   Pt agreeable to mobility session. Required only supervision for ambulation. Pt desat with ambulation to 82%, recovered with rest. Educated pt on importance of O2 when she ambulates from now on. Pt agreeable. Back in bed with all needs met, daughter in room.  Mckenzie [name] Mobility Specialist Please contact via SecureChat or  Rehab office at [phone]

## 2024-01-06 NOTE — Plan of Care (Signed)
  Problem: Education: Goal: Ability to describe self-care measures that may prevent or decrease complications (Diabetes Survival Skills Education) will improve Outcome: Progressing   Problem: Coping: Goal: Ability to adjust to condition or change in health will improve Outcome: Progressing   Problem: Health Behavior/Discharge Planning: Goal: Ability to manage health-related needs will improve Outcome: Progressing   Problem: Metabolic: Goal: Ability to maintain appropriate glucose levels will improve Outcome: Progressing   Problem: Skin Integrity: Goal: Risk for impaired skin integrity will decrease Outcome: Progressing   Problem: Nutritional: Goal: Progress toward achieving an optimal weight will improve Outcome: Progressing   Problem: Nutritional: Goal: Maintenance of adequate nutrition will improve Outcome: Progressing

## 2024-01-06 NOTE — Progress Notes (Signed)
 PHARMACY - ANTICOAGULATION CONSULT NOTE  Pharmacy Consult for heparin  Indication: pulmonary embolus  Labs: Recent Labs    01/04/24 1327 01/05/24 1427 01/05/24 1751 01/05/24 1926 01/06/24 0258  HGB 11.8* 12.4  --   --  11.9*  HCT 37.0 39.7  --   --  38.4  PLT 242.0 280  --   --  300  HEPARINUNFRC  --   --   --   --  0.46  CREATININE 0.97 1.03*  --   --   --   TROPONINIHS  --   --  13 13  --    Assessment/Plan:  68yo female therapeutic on heparin  with initial dosing for PE. Will continue infusion at current rate of 2000 units/hr and confirm stable with additional level.  Lonnie Roberts, PharmD, BCPS 01/06/2024 3:25 AM

## 2024-01-06 NOTE — Care Management (Addendum)
 Transition of Care [facility]) - Inpatient Brief Assessment   Patient Details  Name: CHELLA GASTON MRN: [id number] Date of Birth: Feb 15, 1956  [facility] CM/SW Contact:    Ronni Colace, RN Phone Number: 01/06/2024, 3:13 PM   Clinical Narrative:  68 year old patient with PE likely from DVT recent road trip. 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  No needs at this time, will get a first months free card for DOAC PLEASE SEND DC MEDS TO [facility] . They are aware of patient  Transition of Care Asessment: Insurance and Status: Selfpay Patient has primary care physician: Yes   Prior level of function:: Independent Prior/Current Home Services: No current home services Social Drivers of Health Review: SDOH reviewed no interventions necessary Readmission risk has been reviewed: Yes Transition of care needs: no transition of care needs at this time

## 2024-01-06 NOTE — Progress Notes (Signed)
 PROGRESS NOTE  Mckenzie [name] UEA:[id number] DOB: 1956-03-07   PCP: Mariel Shope, DO  Patient is from: Home.  DOA: 01/05/2024 LOS: 1  Chief complaints Chief Complaint  Patient presents with   Shortness of Breath     Brief Narrative / Interim history: 68 year old F with PMH of NIDDM-2, HTN, morbid obesity and polyarthralgia sent to ED by PCP due to elevated D-dimer to 2.35.  She presented to PCP office on 4/30 with exertional dyspnea, chest discomfort and lower extremity swelling.  She was prescribed prednisone  and had blood work ordered.  Patient reports sedentary lifestyle, recent flight to [place]  (not long enough).  States that she is up-to-date on age-appropriate cancer screening.  No prior history of blood clot.  Not on hormonal therapy.  Never smoked cigarettes  In ED, CT angio chest showed bilateral PE with RV to LV ratio of 1.6.  She was hemodynamically stable without oxygen requirement.  Basic labs without significant finding.  Started on IV heparin .  Echocardiogram ordered.   Subjective: Seen and examined earlier this morning.  No major events overnight of this morning.  Sitting on the edge of the bed.  Denies chest pain, shortness of breath or calf tenderness or pain.  Objective: Vitals:   01/06/24 0600 01/06/24 0700 01/06/24 0800 01/06/24 1006  BP: 123/78 117/74 124/77   Pulse: 73 74 73   Resp: 19 14 16    Temp:    97.8 F (36.6 C)  TempSrc:    Oral  SpO2: 93% 100% 95%   Weight:      Height:        Examination:  GENERAL: No apparent distress.  Nontoxic. HEENT: MMM.  Vision and hearing grossly intact.  NECK: Supple.  Difficult to assess JVD due to body habitus.[name]  RESP:  No IWOB.  Fair aeration bilaterally but limited exam due to body habitus CVS:  RRR. Heart sounds normal.  ABD/GI/GU: BS+. Abd soft, NTND.  MSK/EXT:  Moves extremities. No apparent deformity.  Trace edema but difficult to assess SKIN: no apparent skin lesion or wound NEURO: Awake, alert and  oriented appropriately.  No apparent focal neuro deficit. PSYCH: Calm. Normal affect.   Consultants:  None  Procedures: None  Microbiology summarized: None  Assessment and plan: Acute bilateral PE with cor pulmonale: Patient reports sedentary lifestyle, recent flight to [place]  (not long enough).  States that she is up-to-date on age-appropriate cancer screening.  No prior history of blood clot.  Not on hormonal therapy.  Never smoked cigarettes.  CT angio with bilateral PE and RV to LV ratio of 1.6.  Hemodynamically stable.  No oxygen requirement -Continue IV heparin  pending echocardiogram -Lower extremity venous Doppler  NIDDM-2: A1c 6.9%.  On low glipizide  at home Recent Labs  Lab 01/06/24 0943 01/06/24 1244  GLUCAP 96 104*  -Continue sliding scale insulin  here -Could be a good candidate for GLP-1 agonist or metformin  outpatient  Essential hypertension: Normotensive off home antihypertensive meds -Continue holding home meds  History of gout -Continue allopurinol   Hyperlipidemia -Continue home statin  Polyarthralgia -Hold p.o. Voltaren  while on anticoagulation -Tylenol as needed   Morbid Obesity Body mass index is 56.96 kg/m. - Encourage lifestyle change to lose weight - May benefit from GLP-1 agonist          DVT prophylaxis:  On full dose anticoagulation  Code Status: Full code Family Communication: Updated patient's daughter at bedside Level of care: Telemetry Cardiac Status is: Inpatient Remains inpatient appropriate because: Acute PE with  cor pulmonale   Final disposition: Home   55 minutes with more than 50% spent in reviewing records, counseling patient/family and coordinating care.   Sch Meds:  Scheduled Meds:  allopurinol   300 mg Oral Daily   atorvastatin   40 mg Oral Daily   insulin  aspart  0-15 Units Subcutaneous TID WC   insulin  aspart  0-5 Units Subcutaneous QHS   Continuous Infusions:  heparin  2,000 Units/hr (01/06/24 0727)   PRN  Meds:.morphine  injection, ondansetron  **OR** ondansetron  (ZOFRAN ) IV, traMADol   Antimicrobials: Anti-infectives (From admission, onward)    None        I have personally reviewed the following labs and images: CBC: Recent Labs  Lab 01/04/24 1327 01/05/24 1427 01/06/24 0258  WBC 8.8 12.7* 15.6*  NEUTROABS 5.8  --   --   HGB 11.8* 12.4 11.9*  HCT 37.0 39.7 38.4  MCV 88.7 89.4 89.7  PLT 242.0 280 300   BMP &GFR Recent Labs  Lab 01/04/24 1327 01/05/24 1427 01/06/24 0258  NA 143 139 140  K 4.2 4.2 4.5  CL 102 103 102  CO2 33* 24 26  GLUCOSE 111* 126* 96  BUN 20 25* 24*  CREATININE 0.97 1.03* 0.99  CALCIUM  9.4 9.8 9.6   Estimated Creatinine Clearance: 106 mL/min (by C-G formula based on SCr of 0.99 mg/dL). Liver & Pancreas: Recent Labs  Lab 01/04/24 1327 01/06/24 0258  AST 15 22  ALT 25 34  ALKPHOS 82 66  BILITOT 0.8 0.6  PROT 7.3 7.8  ALBUMIN 3.7 3.2*   No results for input(s): "LIPASE", "AMYLASE" in the last 168 hours. No results for input(s): "AMMONIA" in the last 168 hours. Diabetic: Recent Labs    01/04/24 1327  HGBA1C 6.9*   Recent Labs  Lab 01/06/24 0943 01/06/24 1244  GLUCAP 96 104*   Cardiac Enzymes: No results for input(s): "CKTOTAL", "CKMB", "CKMBINDEX", "TROPONINI" in the last 168 hours. No results for input(s): "PROBNP" in the last 8760 hours. Coagulation Profile: No results for input(s): "INR", "PROTIME" in the last 168 hours. Thyroid Function Tests: No results for input(s): "TSH", "T4TOTAL", "FREET4", "T3FREE", "THYROIDAB" in the last 72 hours. Lipid Profile: No results for input(s): "CHOL", "HDL", "LDLCALC", "TRIG", "CHOLHDL", "LDLDIRECT" in the last 72 hours. Anemia Panel: No results for input(s): "VITAMINB12", "FOLATE", "FERRITIN", "TIBC", "IRON", "RETICCTPCT" in the last 72 hours. Urine analysis:    Component Value Date/Time   BILIRUBINUR negative 11/24/2017 0824   BILIRUBINUR neg 11/18/2014 1126   KETONESUR negative  11/24/2017 0824   PROTEINUR =100 (A) 11/24/2017 0824   PROTEINUR 30 11/18/2014 1126   UROBILINOGEN 0.2 11/24/2017 0824   NITRITE Negative 11/24/2017 0824   NITRITE neg 11/18/2014 1126   LEUKOCYTESUR Small (1+) (A) 11/24/2017 0824   Sepsis Labs: Invalid input(s): "PROCALCITONIN", "LACTICIDVEN"  Microbiology: No results found for this or any previous visit (from the past 240 hours).  Radiology Studies: CT Angio Chest PE W and/or Wo Contrast Addendum Date: 01/05/2024 ADDENDUM REPORT: 01/05/2024 17:13 ADDENDUM: Critical Value/emergent results were called by telephone at the time of interpretation on 01/05/2024 at 5:13 pm to provider OSCAR ZELAYA , who verbally acknowledged these results. Electronically Signed   By: Rosalene Colon M.D.   On: 01/05/2024 17:13   Result Date: 01/05/2024 CLINICAL DATA:  Shortness of breath.  Positive D-dimer. EXAM: CT ANGIOGRAPHY CHEST WITH CONTRAST TECHNIQUE: Multidetector CT imaging of the chest was performed using the standard protocol during bolus administration of intravenous contrast. Multiplanar CT image reconstructions and MIPs were obtained  to evaluate the vascular anatomy. RADIATION DOSE REDUCTION: This exam was performed according to the departmental dose-optimization program which includes automated exposure control, adjustment of the mA and/or kV according to patient size and/or use of iterative reconstruction technique. CONTRAST:  75mL OMNIPAQUE  IOHEXOL  350 MG/ML SOLN COMPARISON:  None Available. FINDINGS: Cardiovascular: Large bilateral pulmonary emboli are noted in the upper and lower lobe branches. RV/LV ratio of 1.6 is noted suggesting right heart strain. No pericardial effusion. Mediastinum/Nodes: No enlarged mediastinal, hilar, or axillary lymph nodes. Thyroid gland, trachea, and esophagus demonstrate no significant findings. Lungs/Pleura: No pneumothorax or pleural effusion is noted. Mild patchy airspace opacities are noted bilaterally which may represent  mild multifocal inflammation. Upper Abdomen: No acute abnormality. Musculoskeletal: No chest wall abnormality. No acute or significant osseous findings. Review of the MIP images confirms the above findings. IMPRESSION: Bilateral pulmonary emboli are noted. Positive for acute PE with CT evidence of right heart strain (RV/LV Ratio = 1.6) consistent with at least submassive (intermediate risk) PE. The presence of right heart strain has been associated with an increased risk of morbidity and mortality. Please refer to the "Code PE Focused" order set in EPIC. An attempt was made to contact the ordering provider regarding this critical finding. The provider cannot be reached but a voicemail was left on their phone. This will be converted to a call report. Mild patchy airspace opacities are noted bilaterally suggesting mild multifocal inflammation. Electronically Signed: By: Rosalene Colon M.D. On: 01/05/2024 17:01   DG Chest 2 View Result Date: 01/05/2024 CLINICAL DATA:  Shortness of breath. EXAM: CHEST - 2 VIEW COMPARISON:  01/04/2024. FINDINGS: Low lung volume. There is diffuse moderate pulmonary vascular congestion with bilateral hilar and bibasilar predominance. Bilateral lung fields are otherwise clear. No acute consolidation or lung collapse. Bilateral costophrenic angles are clear. Mildly enlarged cardio-mediastinal silhouette. No acute osseous abnormalities. The soft tissues are within normal limits. IMPRESSION: Findings favor congestive heart failure/pulmonary edema. Electronically Signed   By: Beula Brunswick M.D.   On: 01/05/2024 15:04      Kandyce Dieguez T. Alaysha Jefcoat [facility]  If 7PM-7AM, please contact night-coverage [email or web address] 01/06/2024, 1:08 PM

## 2024-01-06 NOTE — ED Notes (Signed)
 Blankets and pillows given for comfort measures/ daughter given recliner/ water and food offered

## 2024-01-07 ENCOUNTER — Other Ambulatory Visit (HOSPITAL_COMMUNITY): Payer: Self-pay

## 2024-01-07 ENCOUNTER — Inpatient Hospital Stay (HOSPITAL_COMMUNITY): Payer: MEDICAID

## 2024-01-07 DIAGNOSIS — E1169 Type 2 diabetes mellitus with other specified complication: Secondary | ICD-10-CM | POA: Diagnosis not present

## 2024-01-07 DIAGNOSIS — I2609 Other pulmonary embolism with acute cor pulmonale: Secondary | ICD-10-CM | POA: Diagnosis not present

## 2024-01-07 DIAGNOSIS — M7989 Other specified soft tissue disorders: Secondary | ICD-10-CM

## 2024-01-07 DIAGNOSIS — M1A0720 Idiopathic chronic gout, left ankle and foot, without tophus (tophi): Secondary | ICD-10-CM | POA: Diagnosis not present

## 2024-01-07 LAB — CBC
HCT: 37 % (ref 36.0–46.0)
Hemoglobin: 11.4 g/dL — ABNORMAL LOW (ref 12.0–15.0)
MCH: 27.4 pg (ref 26.0–34.0)
MCHC: 30.8 g/dL (ref 30.0–36.0)
MCV: 88.9 fL (ref 80.0–100.0)
Platelets: 255 10*3/uL (ref 150–400)
RBC: 4.16 MIL/uL (ref 3.87–5.11)
RDW: 16.4 % — ABNORMAL HIGH (ref 11.5–15.5)
WBC: 11.3 10*3/uL — ABNORMAL HIGH (ref 4.0–10.5)
nRBC: 0 % (ref 0.0–0.2)

## 2024-01-07 LAB — HEPARIN LEVEL (UNFRACTIONATED): Heparin Unfractionated: 0.38 [IU]/mL (ref 0.30–0.70)

## 2024-01-07 LAB — RENAL FUNCTION PANEL
Albumin: 3 g/dL — ABNORMAL LOW (ref 3.5–5.0)
Anion gap: 11 (ref 5–15)
BUN: 19 mg/dL (ref 8–23)
CO2: 27 mmol/L (ref 22–32)
Calcium: 9 mg/dL (ref 8.9–10.3)
Chloride: 102 mmol/L (ref 98–111)
Creatinine, Ser: 0.98 mg/dL (ref 0.44–1.00)
GFR, Estimated: >60 mL/min (ref >60)
Glucose, Bld: 96 mg/dL (ref 70–99)
Phosphorus: 4.7 mg/dL — ABNORMAL HIGH (ref 2.5–4.6)
Potassium: 4.1 mmol/L (ref 3.5–5.1)
Sodium: 140 mmol/L (ref 135–145)

## 2024-01-07 LAB — GLUCOSE, CAPILLARY: Glucose-Capillary: 119 mg/dL — ABNORMAL HIGH (ref 70–99)

## 2024-01-07 LAB — MAGNESIUM: Magnesium: 1.6 mg/dL — ABNORMAL LOW (ref 1.7–2.4)

## 2024-01-07 MED ORDER — MAGNESIUM SULFATE 2 GM/50ML IV SOLN
2.0000 g | Freq: Once | INTRAVENOUS | Status: AC
  Administered 2024-01-07: 2 g via INTRAVENOUS
  Filled 2024-01-07: qty 50

## 2024-01-07 MED ORDER — APIXABAN 5 MG PO TABS
10.0000 mg | ORAL_TABLET | Freq: Two times a day (BID) | ORAL | Status: DC
  Administered 2024-01-07: 10 mg via ORAL
  Filled 2024-01-07: qty 2

## 2024-01-07 MED ORDER — ACETAMINOPHEN 500 MG PO TABS: 1000.0000 mg | ORAL_TABLET | Freq: Four times a day (QID) | ORAL | Status: DC | PRN

## 2024-01-07 MED ORDER — ACETAMINOPHEN 500 MG PO TABS
1000.0000 mg | ORAL_TABLET | Freq: Four times a day (QID) | ORAL | Status: DC | PRN
  Administered 2024-01-07: 1000 mg via ORAL
  Filled 2024-01-07: qty 2

## 2024-01-07 MED ORDER — OXYCODONE HCL 5 MG PO TABS
5.0000 mg | ORAL_TABLET | Freq: Four times a day (QID) | ORAL | Status: DC | PRN
  Administered 2024-01-07: 5 mg via ORAL
  Filled 2024-01-07: qty 1

## 2024-01-07 MED ORDER — ONDANSETRON HCL 4 MG PO TABS
4.0000 mg | ORAL_TABLET | Freq: Four times a day (QID) | ORAL | 0 refills | Status: AC | PRN
  Filled 2024-01-07: qty 20, 5d supply, fill #0

## 2024-01-07 MED ORDER — APIXABAN 5 MG PO TABS: 5.0000 mg | ORAL_TABLET | Freq: Two times a day (BID) | ORAL | Status: DC

## 2024-01-07 MED ORDER — APIXABAN 5 MG PO TABS: 5.0000 mg | ORAL_TABLET | Freq: Two times a day (BID) | ORAL | 3 refills | Status: DC

## 2024-01-07 MED ORDER — APIXABAN (ELIQUIS) VTE STARTER PACK (10MG AND 5MG)
ORAL_TABLET | ORAL | 0 refills | Status: DC
  Filled 2024-01-07: qty 74, 30d supply, fill #0

## 2024-01-07 NOTE — Progress Notes (Addendum)
 PHARMACY - ANTICOAGULATION CONSULT NOTE  Pharmacy Consult for heparin ; heparin  >> apixaban Indication: pulmonary embolus  Labs: Recent Labs    01/05/24 1427 01/05/24 1751 01/05/24 1926 01/06/24 0258 01/06/24 0959 01/07/24 0304  HGB 12.4  --   --  11.9*  --  11.4*  HCT 39.7  --   --  38.4  --  37.0  PLT 280  --   --  300  --  255  HEPARINUNFRC  --   --   --  0.46 0.46 0.38  CREATININE 1.03*  --   --  0.99  --  0.98  TROPONINIHS  --  13 13  --   --   --    Assessment:  68yo female presented with shortness of breath, found to have bilateral submassive PE with RHS. Not on anticoagulation PTA. Pharmacy consulted for heparin  management.  Heparin  level 0.38 is therapeutic with heparin  running at 2000 units/hr. Hgb (11.4) and PLTs (255) are stable. Per RN, no report of pauses, issues with the line, or signs of bleeding.   Goal of Therapy:  Heparin  level 0.3-0.7 units/ml Monitor platelets by anticoagulation protocol: Yes  Plan: Continue heparin  infusion at 2000 units/hr Monitor daily heparin  level, CBC, and signs/symptoms of bleeding F/u transition to oral AC, pt is uninsured and will need PAP    ADDENDUM: Consult to transition to Eliquis  Plan: Stop heparin  infusion Start apixaban 10 mg twice daily for 7 days then 5 mg twice daily thereafter Monitor CBC and signs/symptoms of bleeding F/u with case management about the MATCH program/PAP    Valarie Garner, PharmD PGY1 Pharmacy Resident  Please check AMION for all [facility] phone numbers After 10:00 PM, call Main Pharmacy [phone]  01/07/2024 7:38 AM

## 2024-01-07 NOTE — Discharge Instructions (Addendum)
 Information on my medicine - ELIQUIS (apixaban)  This medication education was reviewed with me or my healthcare representative as part of my discharge preparation.  Why was Eliquis prescribed for you? Eliquis was prescribed to treat blood clots that may have been found in the veins of your legs (deep vein thrombosis) or in your lungs (pulmonary embolism) and to reduce the risk of them occurring again.  What do You need to know about Eliquis ? The starting dose is 10 mg (two 5 mg tablets) taken TWICE daily for the FIRST SEVEN (7) DAYS, then on  01/14/2024  the dose is reduced to ONE 5 mg tablet taken TWICE daily. Eliquis may be taken with or without food.   Try to take the dose about the same time in the morning and in the evening. If you have difficulty swallowing the tablet whole please discuss with your pharmacist how to take the medication safely.  Take Eliquis exactly as prescribed and DO NOT stop taking Eliquis without talking to the doctor who prescribed the medication.  Stopping may increase your risk of developing a new blood clot.  Refill your prescription before you run out.  After discharge, you should have regular check-up appointments with your healthcare provider that is prescribing your Eliquis.    What do you do if you miss a dose? If a dose of ELIQUIS is not taken at the scheduled time, take it as soon as possible on the same day and twice-daily administration should be resumed. The dose should not be doubled to make up for a missed dose.  Important Safety Information A possible side effect of Eliquis is bleeding. You should call your healthcare provider right away if you experience any of the following: Bleeding from an injury or your nose that does not stop. Unusual colored urine (red or dark brown) or unusual colored stools (red or black). Unusual bruising for unknown reasons. A serious fall or if you hit your head (even if there is no bleeding).  Some  medicines may interact with Eliquis and might increase your risk of bleeding or clotting while on Eliquis. To help avoid this, consult your healthcare provider or pharmacist prior to using any new prescription or non-prescription medications, including herbals, vitamins, non-steroidal anti-inflammatory drugs (NSAIDs) and supplements.  This website has more information on Eliquis (apixaban): http://www.eliquis.com/eliquis/home

## 2024-01-07 NOTE — Progress Notes (Signed)
 BLE venous duplex has been completed.   Results can be found under chart review under CV PROC. 01/07/2024 2:07 PM Chapman Matteucci RVT, RDMS

## 2024-01-07 NOTE — Progress Notes (Signed)
 Mobility Specialist Progress Note:   01/07/24 1112  Mobility  Activity Ambulated with assistance in hallway  Level of Assistance Standby assist, set-up cues, supervision of patient - no hands on  Assistive Device None  Distance Ambulated (ft) 200 ft  Activity Response Tolerated well  Mobility Referral Yes  Mobility visit 1 Mobility  Mobility Specialist Start Time (ACUTE ONLY) 1112  Mobility Specialist Stop Time (ACUTE ONLY) 1120  Mobility Specialist Time Calculation (min) (ACUTE ONLY) 8 min    Pre Mobility: SpO2 95% Post Mobility: SpO2 94%  Pt agreeable to mobility session. Required no physical assistance throughout ambulation. No c/o SOB with exertion. SpO2 WFL when reliable pleth. Pt back sitting EOB with all needs met, eager for d/c.  Oneda Big Mobility Specialist Please contact via SecureChat or  Rehab office at [phone]

## 2024-01-07 NOTE — Progress Notes (Signed)
 Nurse requested Mobility Specialist to perform oxygen saturation test with pt which includes removing pt from oxygen both at rest and while ambulating.  Below are the results from that testing.     Patient Saturations on Room Air at Rest = spO2 95%  Patient Saturations on Room Air while Ambulating = sp02 91% .   At end of testing pt left in room on RA  Reported results to nurse.   Oneda Big Mobility Specialist Please contact via SecureChat or  Rehab office at [phone]

## 2024-01-07 NOTE — Discharge Summary (Signed)
 Physician Discharge Summary  Mckenzie [name]:[id number] DOB: [birth year] DOA: 01/05/2024  PCP: Mariel Shope, DO  Admit date: 01/05/2024 Discharge date: 01/07/24  Admitted From: Home Disposition: Home Recommendations for Outpatient Follow-up:  Follow up with PCP in 1 week Check BP, CMP and CBC at follow-up Please follow up on the following pending results: None  Home Health: No need identified Equipment/Devices: No need identified Discharge Condition: Stable CODE STATUS: Full code  Follow-up Information     [name], [name], DO. Schedule an appointment as soon as possible for [name] visit in 1 week(s).   Specialty: Family Medicine Contact information: [name] [place] [address] [phone]                 Hospital course 68 year old F with PMH of NIDDM-2, HTN, morbid obesity and polyarthralgia sent to ED by PCP due to elevated D-dimer to 2.35.  She presented to PCP office on 4/30 with exertional dyspnea, chest discomfort and lower extremity swelling.  She was prescribed prednisone  and had blood work ordered.  Patient reports sedentary lifestyle, recent flight to [place]  (not long enough).  States that she is up-to-date on age-appropriate cancer screening.  No prior history of blood clot.  Not on hormonal therapy.  Never smoked cigarettes   In ED, CT angio chest showed bilateral PE with RV to LV ratio of 1.6.  She was hemodynamically stable without oxygen requirement.  Basic labs without significant finding.  Started on IV heparin .    The next day, patient's chest pain and dyspnea resolved.  Hemodynamically stable.  No oxygen requirement.  TTE with LVEF of 55 to 60%, indeterminate DD and mild to moderately reduced RVSF.  Continued on IV heparin .  On the day of discharge, lower extremity venous Doppler negative for DVT but technically difficult study due to body habitus.  Transitioned to starter pack Eliquis and discharged home.  Check CBC at follow-up. Recommend  age-appropriate cancer screening.  Also recommend weight management  See individual problem list below for more.   Problems addressed during this hospitalization Acute bilateral PE with cor pulmonale: Patient reports sedentary lifestyle, recent flight to [place]  (not long enough).  States that she is up-to-date on age-appropriate cancer screening.  No prior history of blood clot.  Not on hormonal therapy.  Never smoked cigarettes.  CT angio with bilateral PE and RV to LV ratio of 1.6.  TTE with some signs of RV strain.  LE venous Doppler negative for DVT but technically difficult study.  Hemodynamically stable.  No oxygen requirement.  Currently asymptomatic. -Discharged on starter pack Eliquis -Ensure age-appropriate cancer screening - Would benefit from weight loss    NIDDM-2: A1c 6.9%.  On low glipizide  at home -Continue home glipizide  for now -She may benefit from metformin  or GLP-1 agonist given morbid obesity   Essential hypertension: Normotensive off home antihypertensive meds -Discontinued amlodipine .  Cause lower extremity edema -Advised to resume losartan  if blood pressure > 130/80   History of gout -Continue allopurinol    Hyperlipidemia -Continue home statin   Polyarthralgia -Discontinued p.o. Voltaren  -Tylenol as needed -Could benefit from weight loss  BLE edema: Venous Doppler negative. -Continue Lasix  as needed -Discontinued amlodipine   Nausea -Zofran  as needed   Morbid Obesity Body mass index is 54 kg/m. -Consider lifestyle change, GLP-1 agonist, bariatric surgery           Time spent 35 minutes  Vital signs Vitals:   01/07/24 0865 01/07/24 7846 01/07/24 9629 01/07/24 5284  BP: 121/62 125/73  122/75  Pulse: 79 75  76  Temp: 98.2 F (36.8 C) 97.9 F (36.6 C)  98.8 F (37.1 C)  Resp:    20  Height:      Weight:   (!) 180.6 kg   SpO2: 92% 95%    TempSrc: Oral Oral  Oral  BMI (Calculated):   53.99      Discharge exam  GENERAL: No apparent  distress.  Nontoxic. HEENT: MMM.  Vision and hearing grossly intact.  NECK: Supple.  Difficult to assess JVD due to body habitus RESP:  No IWOB.  Fair aeration bilaterally but limited exam due to body habitus. CVS:  RRR. Heart sounds normal.  ABD/GI/GU: BS+. Abd soft, NTND.  MSK/EXT:  Moves extremities. No apparent deformity.  Trace BLE edema SKIN: no apparent skin lesion or wound NEURO: Awake and alert. Oriented appropriately.  No apparent focal neuro deficit. PSYCH: Calm. Normal affect.   Discharge Instructions Discharge Instructions     Diet - low sodium heart healthy   Complete by: As directed    Discharge instructions   Complete by: As directed    It has been [name] pleasure taking care of you!  You were hospitalized due to pulmonary embolism (blood clot in your lung).  It is unclear what caused the blood clot.  We are discharging you on Eliquis (blood thinner).  It is very important that you take this medication as prescribed.  Watch for signs of bleeding including blood in urine in the stool or dark tarry looking stool.  Avoid any over-the-counter pain medication other than Tylenol while taking blood thinner.  We made adjustment to your home medication during this hospitalization.  Please review your new medication list and the directions on your medications before you take them.  Follow-up with your primary care doctor in 1 to 2 weeks or sooner if needed.   Take care,   Increase activity slowly   Complete by: As directed       Allergies as of 01/07/2024   No Known Allergies      Medication List     PAUSE taking these medications    losartan  50 MG tablet Wait to take this until: Jan 14, 2024 Commonly known as: COZAAR  Take 1 tablet (50 mg total) by mouth daily.       STOP taking these medications    amLODipine  10 MG tablet Commonly known as: NORVASC    diclofenac  75 MG EC tablet Commonly known as: VOLTAREN    predniSONE  20 MG tablet Commonly known as: DELTASONE         TAKE these medications    acetaminophen 500 MG tablet Commonly known as: TYLENOL Take 2 tablets (1,000 mg total) by mouth every 6 (six) hours as needed for mild pain (pain score 1-3) or headache.   albuterol  108 (90 Base) MCG/ACT inhaler Commonly known as: VENTOLIN  HFA Inhale 2 puffs into the lungs every 6 (six) hours as needed for wheezing or shortness of breath.   allopurinol  300 MG tablet Commonly known as: ZYLOPRIM  Take 1 tablet (300 mg total) by mouth daily.   atorvastatin  40 MG tablet Commonly known as: LIPITOR Take 1 tablet (40 mg total) by mouth daily.   baclofen  20 MG tablet Commonly known as: LIORESAL  Take 1 tablet (20 mg total) by mouth 3 (three) times daily.   colchicine  0.6 MG tablet Take 1 tablet (0.6 mg total) by mouth 2 (two) times daily as needed.   Eliquis DVT/PE Starter Pack Generic  drug: Apixaban Starter Pack (10mg  and 5mg ) Take as directed on package: start with two-5mg  tablets twice daily for 7 days. On day 8, switch to one-5mg  tablet twice daily.   apixaban 5 MG Tabs tablet Commonly known as: ELIQUIS Take 1 tablet (5 mg total) by mouth 2 (two) times daily. Start after you finish the starter pack Start taking on: February 06, 2024   Fish Oil 500 MG Caps Take by mouth.   furosemide  20 MG tablet Commonly known as: LASIX  Take 1 tablet (20 mg total) by mouth 2 (two) times daily. For 3 days then as needed   glipiZIDE  5 MG tablet Commonly known as: GLUCOTROL  Take 1 tablet (5 mg total) by mouth daily before breakfast.   JOINT HEALTH PO Take by mouth.   multivitamin capsule Take 1 capsule by mouth daily.   ondansetron  4 MG tablet Commonly known as: Zofran  Take 1 tablet (4 mg total) by mouth every 6 (six) hours as needed for up to 5 days for nausea or vomiting.   potassium chloride  SA 20 MEQ tablet Commonly known as: KLOR-CON  M Take 1 tablet (20 mEq total) by mouth 2 (two) times daily. With Lasix  tab as needed   PROBIOTIC BLEND PO Take by  mouth.   traMADol  50 MG tablet Commonly known as: ULTRAM  1 tab PO in the day and 2 tabs qhs   valACYclovir  1000 MG tablet Commonly known as: VALTREX  2 po at outbreak onset then repeat in 12h for each outbreak        Consultations: None  Procedures/Studies:   VAS US  LOWER EXTREMITY VENOUS (DVT) Result Date: 01/07/2024  Lower Venous DVT Study Patient Name:  JACQUA STEERE Tennyson  Date of Exam:   01/07/2024 Medical Rec #: [id number]        Accession #:    [id number] Date of Birth: [birth year]       Patient Gender: F Patient Age:   9 years Exam Location:  [facility] Procedure:      VAS US  LOWER EXTREMITY VENOUS (DVT) Referring Phys: Sudie Ely --------------------------------------------------------------------------------  Indications: LLE swelling and PE.  Risk Factors: Recent extended car travel. Limitations: Body habitus and poor ultrasound/tissue interface. Comparison Study: Previous exam on 01/07/2015 was negative for DVT Performing Technologist: Arlyce Berger RVT, RDMS  Examination Guidelines: [name] complete evaluation includes B-mode imaging, spectral Doppler, color Doppler, and power Doppler as needed of all accessible portions of each vessel. Bilateral testing is considered an integral part of [name] complete examination. Limited examinations for reoccurring indications may be performed as noted. The reflux portion of the exam is performed with the patient in reverse Trendelenburg.  +--------+---------------+---------+-----------+----------+--------------------+ RIGHT   CompressibilityPhasicitySpontaneityPropertiesThrombus Aging       +--------+---------------+---------+-----------+----------+--------------------+ CFV     Full           Yes      Yes                                       +--------+---------------+---------+-----------+----------+--------------------+ SFJ     Full                                                               +--------+---------------+---------+-----------+----------+--------------------+ FV Prox Full  Yes      Yes                                       +--------+---------------+---------+-----------+----------+--------------------+ FV Mid  Full           Yes      Yes                                       +--------+---------------+---------+-----------+----------+--------------------+ FV                     Yes      Yes                  patent by            Distal                                               color/doppler        +--------+---------------+---------+-----------+----------+--------------------+ PFV     Full                                                              +--------+---------------+---------+-----------+----------+--------------------+ POP     Full           Yes      Yes                                       +--------+---------------+---------+-----------+----------+--------------------+ PTV                                                  Not well visualized  +--------+---------------+---------+-----------+----------+--------------------+ PERO                                                 Not well visualized  +--------+---------------+---------+-----------+----------+--------------------+   +--------+---------------+---------+-----------+----------+--------------------+ LEFT    CompressibilityPhasicitySpontaneityPropertiesThrombus Aging       +--------+---------------+---------+-----------+----------+--------------------+ CFV     Full           Yes      Yes                                       +--------+---------------+---------+-----------+----------+--------------------+ SFJ     Full                                                              +--------+---------------+---------+-----------+----------+--------------------+  FV Prox Full           Yes      Yes                                        +--------+---------------+---------+-----------+----------+--------------------+ FV Mid  Full           Yes      Yes                                       +--------+---------------+---------+-----------+----------+--------------------+ FV                     Yes      Yes                  patent by            Distal                                               color/doppler        +--------+---------------+---------+-----------+----------+--------------------+ PFV     Full                                                              +--------+---------------+---------+-----------+----------+--------------------+ POP     Full           Yes      Yes                                       +--------+---------------+---------+-----------+----------+--------------------+ PTV                                                  Not well visualized  +--------+---------------+---------+-----------+----------+--------------------+ PERO                                                 Not well visualized  +--------+---------------+---------+-----------+----------+--------------------+     Summary: BILATERAL: -No evidence of popliteal cyst, bilaterally. RIGHT: - There is no evidence of deep vein thrombosis in the lower extremity. However, portions of this examination were limited- see technologist comments above.  LEFT: - There is no evidence of deep vein thrombosis in the lower extremity. However, portions of this examination were limited- see technologist comments above.  *See table(s) above for measurements and observations.    Preliminary    ECHOCARDIOGRAM COMPLETE Result Date: 01/06/2024    ECHOCARDIOGRAM REPORT   Patient Name:   ASHA LEVENDUSKY Nez Date of Exam: 01/06/2024 Medical Rec #:  [id number]       Height:       72.0 in Accession #:    [id number]  Weight:       420.0 lb Date of Birth:  11/22/1955      BSA:          2.920 m Patient Age:    67 years        BP:            118/75 mmHg Patient Gender: F               HR:           88 bpm. Exam Location:  Inpatient Procedure: 2D Echo, Color Doppler, Cardiac Doppler and Intracardiac            Opacification Agent (Both Spectral and Color Flow Doppler were            utilized during procedure). Indications:    I26.02 Pulmonary embolus  History:        Patient has no prior history of Echocardiogram examinations.                 Risk Factors:Hypertension, Dyslipidemia and Diabetes.  Sonographer:    Janette Medley Referring Phys: [id number] MOHAMMAD L GARBA  Sonographer Comments: Technically difficult study due to patient body habitus IMPRESSIONS  1. Left ventricular ejection fraction, by estimation, is 55 to 60%. The left ventricle has normal function. The left ventricle has no regional wall motion abnormalities. There is mild concentric left ventricular hypertrophy. Left ventricular diastolic parameters are indeterminate.  2. The right ventricle is not well visualized. However, the RV appears moderately enlarged with mild to moderately reduced systolic function.  3. The mitral valve is normal in structure. No evidence of mitral valve regurgitation. No evidence of mitral stenosis.  4. The aortic valve is normal in structure. Aortic valve regurgitation is not visualized. No aortic stenosis is present.  5. The inferior vena cava is dilated in size with <50% respiratory variability, suggesting right atrial pressure of 15 mmHg. FINDINGS  Left Ventricle: Left ventricular ejection fraction, by estimation, is 55 to 60%. The left ventricle has normal function. The left ventricle has no regional wall motion abnormalities. Definity contrast agent was given IV to delineate the left ventricular  endocardial borders. The left ventricular internal cavity size was normal in size. There is mild concentric left ventricular hypertrophy. Left ventricular diastolic parameters are indeterminate. Right Ventricle: The right ventricular size is moderately enlarged. No  increase in right ventricular wall thickness. Right ventricular systolic function is moderately reduced. Left Atrium: Left atrial size was normal in size. Right Atrium: Right atrial size was normal in size. Pericardium: There is no evidence of pericardial effusion. Mitral Valve: The mitral valve is normal in structure. No evidence of mitral valve regurgitation. No evidence of mitral valve stenosis. Tricuspid Valve: The tricuspid valve is normal in structure. Tricuspid valve regurgitation is not demonstrated. No evidence of tricuspid stenosis. Aortic Valve: The aortic valve is normal in structure. Aortic valve regurgitation is not visualized. No aortic stenosis is present. Aortic valve mean gradient measures 7.0 mmHg. Aortic valve peak gradient measures 13.2 mmHg. Aortic valve area, by VTI measures 2.23 cm. Pulmonic Valve: The pulmonic valve was normal in structure. Pulmonic valve regurgitation is not visualized. No evidence of pulmonic stenosis. Aorta: The aortic root is normal in size and structure. Venous: The inferior vena cava is dilated in size with less than 50% respiratory variability, suggesting right atrial pressure of 15 mmHg. IAS/Shunts: No atrial level shunt detected by color flow Doppler.  LEFT VENTRICLE PLAX 2D LVIDd:  4.20 cm   Diastology LVIDs:         2.80 cm   LV e' medial:    7.72 cm/s LV PW:         1.20 cm   LV E/e' medial:  9.6 LV IVS:        1.30 cm   LV e' lateral:   8.81 cm/s LVOT diam:     2.20 cm   LV E/e' lateral: 8.4 LV SV:         75 LV SV Index:   26 LVOT Area:     3.80 cm  RIGHT VENTRICLE RV S prime:     11.10 cm/s TAPSE (M-mode): 2.3 cm LEFT ATRIUM             Index        RIGHT ATRIUM           Index LA Vol (A2C):   99.7 ml 34.14 ml/m  RA Area:     26.00 cm LA Vol (A4C):   64.0 ml 21.92 ml/m  RA Volume:   77.60 ml  26.57 ml/m LA Biplane Vol: 79.9 ml 27.36 ml/m  AORTIC VALVE AV Area (Vmax):    2.46 cm AV Area (Vmean):   2.53 cm AV Area (VTI):     2.23 cm AV Vmax:            182.00 cm/s AV Vmean:          115.000 cm/s AV VTI:            0.334 m AV Peak Grad:      13.2 mmHg AV Mean Grad:      7.0 mmHg LVOT Vmax:         118.00 cm/s LVOT Vmean:        76.600 cm/s LVOT VTI:          0.196 m LVOT/AV VTI ratio: 0.59  AORTA Ao Root diam: 3.50 cm Ao Asc diam:  3.60 cm MITRAL VALVE               TRICUSPID VALVE MV Area (PHT): 3.91 cm    TR Peak grad:   3.5 mmHg MV Decel Time: 194 msec    TR Vmax:        93.00 cm/s MV E velocity: 74.10 cm/s MV [name] velocity: 80.20 cm/s  SHUNTS MV E/[name] ratio:  0.92        Systemic VTI:  0.20 m                            Systemic Diam: 2.20 cm Aditya Sabharwal Electronically signed by Alwin Baars Signature Date/Time: 01/06/2024/3:06:13 PM    Final    CT Angio Chest PE W and/or Wo Contrast Addendum Date: 01/05/2024 ADDENDUM REPORT: 01/05/2024 17:13 ADDENDUM: Critical Value/emergent results were called by telephone at the time of interpretation on 01/05/2024 at 5:13 pm to provider OSCAR ZELAYA , who verbally acknowledged these results. Electronically Signed   By: Rosalene Colon M.D.   On: 01/05/2024 17:13   Result Date: 01/05/2024 CLINICAL DATA:  Shortness of breath.  Positive D-dimer. EXAM: CT ANGIOGRAPHY CHEST WITH CONTRAST TECHNIQUE: Multidetector CT imaging of the chest was performed using the standard protocol during bolus administration of intravenous contrast. Multiplanar CT image reconstructions and MIPs were obtained to evaluate the vascular anatomy. RADIATION DOSE REDUCTION: This exam was performed according to the departmental dose-optimization program which includes automated exposure control,  adjustment of the mA and/or kV according to patient size and/or use of iterative reconstruction technique. CONTRAST:  75mL OMNIPAQUE  IOHEXOL  350 MG/ML SOLN COMPARISON:  None Available. FINDINGS: Cardiovascular: Large bilateral pulmonary emboli are noted in the upper and lower lobe branches. RV/LV ratio of 1.6 is noted suggesting right heart strain. No  pericardial effusion. Mediastinum/Nodes: No enlarged mediastinal, hilar, or axillary lymph nodes. Thyroid gland, trachea, and esophagus demonstrate no significant findings. Lungs/Pleura: No pneumothorax or pleural effusion is noted. Mild patchy airspace opacities are noted bilaterally which may represent mild multifocal inflammation. Upper Abdomen: No acute abnormality. Musculoskeletal: No chest wall abnormality. No acute or significant osseous findings. Review of the MIP images confirms the above findings. IMPRESSION: Bilateral pulmonary emboli are noted. Positive for acute PE with CT evidence of right heart strain (RV/LV Ratio = 1.6) consistent with at least submassive (intermediate risk) PE. The presence of right heart strain has been associated with an increased risk of morbidity and mortality. Please refer to the "Code PE Focused" order set in EPIC. An attempt was made to contact the ordering provider regarding this critical finding. The provider cannot be reached but [name] voicemail was left on their phone. This will be converted to [name] call report. Mild patchy airspace opacities are noted bilaterally suggesting mild multifocal inflammation. Electronically Signed: By: Rosalene Colon M.D. On: 01/05/2024 17:01   DG Chest 2 View Result Date: 01/05/2024 CLINICAL DATA:  Shortness of breath. EXAM: CHEST - 2 VIEW COMPARISON:  01/04/2024. FINDINGS: Low lung volume. There is diffuse moderate pulmonary vascular congestion with bilateral hilar and bibasilar predominance. Bilateral lung fields are otherwise clear. No acute consolidation or lung collapse. Bilateral costophrenic angles are clear. Mildly enlarged cardio-mediastinal silhouette. No acute osseous abnormalities. The soft tissues are within normal limits. IMPRESSION: Findings favor congestive heart failure/pulmonary edema. Electronically Signed   By: Beula Brunswick M.D.   On: 01/05/2024 15:04   DG Chest 2 View Result Date: 01/04/2024 CLINICAL DATA:  Dyspnea.  EXAM: CHEST - 2 VIEW COMPARISON:  Jan 12, 2018. FINDINGS: Mild cardiomegaly is noted with central pulmonary vascular congestion. Minimal pulmonary edema may be present. Bony thorax is unremarkable. IMPRESSION: Mild cardiomegaly with central pulmonary vascular congestion and possible minimal pulmonary edema. Electronically Signed   By: Rosalene Colon M.D.   On: 01/04/2024 14:51       The results of significant diagnostics from this hospitalization (including imaging, microbiology, ancillary and laboratory) are listed below for reference.     Microbiology: No results found for this or any previous visit (from the past 240 hours).   Labs:  CBC: Recent Labs  Lab 01/04/24 1327 01/05/24 1427 01/06/24 0258 01/07/24 0304  WBC 8.8 12.7* 15.6* 11.3*  NEUTROABS 5.8  --   --   --   HGB 11.8* 12.4 11.9* 11.4*  HCT 37.0 39.7 38.4 37.0  MCV 88.7 89.4 89.7 88.9  PLT 242.0 280 300 255   BMP &GFR Recent Labs  Lab 01/04/24 1327 01/05/24 1427 01/06/24 0258 01/07/24 0304  NA 143 139 140 140  K 4.2 4.2 4.5 4.1  CL 102 103 102 102  CO2 33* 24 26 27   GLUCOSE 111* 126* 96 96  BUN 20 25* 24* 19  CREATININE 0.97 1.03* 0.99 0.98  CALCIUM  9.4 9.8 9.6 9.0  MG  --   --   --  1.6*  PHOS  --   --   --  4.7*   Estimated Creatinine Clearance: 103.5 mL/min (by C-G formula  based on SCr of 0.98 mg/dL). Liver & Pancreas: Recent Labs  Lab 01/04/24 1327 01/06/24 0258 01/07/24 0304  AST 15 22  --   ALT 25 34  --   ALKPHOS 82 66  --   BILITOT 0.8 0.6  --   PROT 7.3 7.8  --   ALBUMIN 3.7 3.2* 3.0*   No results for input(s): "LIPASE", "AMYLASE" in the last 168 hours. No results for input(s): "AMMONIA" in the last 168 hours. Diabetic: No results for input(s): "HGBA1C" in the last 72 hours. Recent Labs  Lab 01/06/24 0943 01/06/24 1244 01/06/24 1558 01/06/24 2129 01/07/24 0616  GLUCAP 96 104* 101* 128* 119*   Cardiac Enzymes: No results for input(s): "CKTOTAL", "CKMB", "CKMBINDEX",  "TROPONINI" in the last 168 hours. No results for input(s): "PROBNP" in the last 8760 hours. Coagulation Profile: No results for input(s): "INR", "PROTIME" in the last 168 hours. Thyroid Function Tests: No results for input(s): "TSH", "T4TOTAL", "FREET4", "T3FREE", "THYROIDAB" in the last 72 hours. Lipid Profile: No results for input(s): "CHOL", "HDL", "LDLCALC", "TRIG", "CHOLHDL", "LDLDIRECT" in the last 72 hours. Anemia Panel: No results for input(s): "VITAMINB12", "FOLATE", "FERRITIN", "TIBC", "IRON", "RETICCTPCT" in the last 72 hours. Urine analysis:    Component Value Date/Time   BILIRUBINUR negative 11/24/2017 0824   BILIRUBINUR neg 11/18/2014 1126   KETONESUR negative 11/24/2017 0824   PROTEINUR =100 ([name]) 11/24/2017 0824   PROTEINUR 30 11/18/2014 1126   UROBILINOGEN 0.2 11/24/2017 0824   NITRITE Negative 11/24/2017 0824   NITRITE neg 11/18/2014 1126   LEUKOCYTESUR Small (1+) ([name]) 11/24/2017 0824   Sepsis Labs: Invalid input(s): "PROCALCITONIN", "LACTICIDVEN"   SIGNED:  Theadore Finger, MD  [facility] Hospitalists 01/07/2024, 4:48 PM

## 2024-01-09 ENCOUNTER — Telehealth: Payer: Self-pay

## 2024-01-09 NOTE — Transitions of Care (Post Inpatient/ED Visit) (Signed)
 01/09/2024  Name: Mckenzie [name] MRN: [id number] DOB: [birth year]  Today's TOC FU Call Status: Today's TOC FU Call Status:: Successful TOC FU Call Completed TOC FU Call Complete Date: 01/09/24 Patient's Name and Date of Birth confirmed.  Transition Care Management Follow-up Telephone Call Date of Discharge: 01/07/24 Type of Discharge: Inpatient Admission Primary Inpatient Discharge Diagnosis:: PE How have you been since you were released from the hospital?: Better Any questions or concerns?: No  Items Reviewed: Did you receive and understand the discharge instructions provided?: Yes Medications obtained,verified, and reconciled?: Yes (Medications Reviewed) Any new allergies since your discharge?: No Dietary orders reviewed?: NA Do you have support at home?: Yes People in Home [RPT]: friend(s)  Medications Reviewed Today: Medications Reviewed Today     Reviewed by Darrall Ellison, LPN (Licensed Practical Nurse) on 01/09/24 at 1441  Med List Status: <None>   Medication Order Taking? Sig Documenting Provider Last Dose Status Informant  acetaminophen (TYLENOL) 500 MG tablet [id number]  Take 2 tablets (1,000 mg total) by mouth every 6 (six) hours as needed for mild pain (pain score 1-3) or headache. Gonfa, Taye T, MD  Active   albuterol  (VENTOLIN  HFA) 108 (90 Base) MCG/ACT inhaler [id number] No Inhale 2 puffs into the lungs every 6 (six) hours as needed for wheezing or shortness of breath. Napolean Backbone A, DO Past Week Active Self, Pharmacy Records  allopurinol  (ZYLOPRIM ) 300 MG tablet [id number] No Take 1 tablet (300 mg total) by mouth daily. [name], [name], DO 01/05/2024 Active Self, Pharmacy Records  apixaban (ELIQUIS) 5 MG TABS tablet [id number]  Take 1 tablet (5 mg total) by mouth 2 (two) times daily. Start after you finish the starter pack Gonfa, Ella Gun, MD  Active   APIXABAN (ELIQUIS) VTE STARTER PACK (10MG  AND 5MG ) [id number]  Take as directed on package: start with two-5mg  tablets  twice daily for 7 days. On day 8, switch to one-5mg  tablet twice daily. Gonfa, Taye T, MD  Active   atorvastatin  (LIPITOR) 40 MG tablet [id number] No Take 1 tablet (40 mg total) by mouth daily. [name], [name], DO 01/05/2024 Active Self, Pharmacy Records  baclofen  (LIORESAL ) 20 MG tablet [id number] No Take 1 tablet (20 mg total) by mouth 3 (three) times daily. Napolean Backbone A, DO Unknown Active Self, Pharmacy Records  colchicine  0.6 MG tablet [id number] No Take 1 tablet (0.6 mg total) by mouth 2 (two) times daily as needed. Marylee Snowball, [name], DO Unknown Active Self, Pharmacy Records  furosemide  (LASIX ) 20 MG tablet [id number] No Take 1 tablet (20 mg total) by mouth 2 (two) times daily. For 3 days then as needed [name], [name], DO Unknown Active Self, Pharmacy Records  glipiZIDE  (GLUCOTROL ) 5 MG tablet [id number] No Take 1 tablet (5 mg total) by mouth daily before breakfast. [name], [name], DO 01/05/2024 Active Self, Pharmacy Records  losartan  (COZAAR ) 50 MG tablet [id number] No Take 1 tablet (50 mg total) by mouth daily. Napolean Backbone A, DO 01/05/2024 Active Self, Pharmacy Records  Misc Natural Products (JOINT HEALTH PO) [id number] No Take by mouth. [provider] 01/05/2024 Active Self, Pharmacy Records  Multiple Vitamin (MULTIVITAMIN) capsule [id number] No Take 1 capsule by mouth daily. [provider] 01/05/2024 Active Self, Pharmacy Records  Omega-3 Fatty Acids (FISH OIL) 500 MG CAPS [id number] No Take by mouth. [provider] 01/05/2024 Active Self, Pharmacy Records  ondansetron  (ZOFRAN ) 4 MG tablet [id number]  Take 1 tablet (4 mg total) by mouth every 6 (six) hours as needed for  up to 5 days for nausea or vomiting. Gonfa, Taye T, MD  Active   potassium chloride  SA (KLOR-CON  M) 20 MEQ tablet [id number] No Take 1 tablet (20 mEq total) by mouth 2 (two) times daily. With Lasix  tab as needed  Patient not taking: Reported on 01/05/2024   Mariel Shope, DO Not Taking Active Self, Pharmacy Records            Med Note [facility] [place], [place] A   Thu Jan 05, 2024  8:18 PM) Has not picked up this medication from pharmacy  Probiotic Product (PROBIOTIC BLEND PO) [id number] No Take by mouth. [provider] 01/05/2024 Active Self, Pharmacy Records  traMADol  (ULTRAM ) 50 MG tablet [id number] No 1 tab PO in the day and 2 tabs qhs [name], [name], DO Unknown Active Self, Pharmacy Records           Med Note [facility] Haskel Link A   Thu Jan 05, 2024  8:14 PM) PRN  valACYclovir  (VALTREX ) 1000 MG tablet [id number] No 2 po at outbreak onset then repeat in 12h for each outbreak [name], [name], DO Unknown Active Self, Pharmacy Records            Home Care and Equipment/Supplies: Were Home Health Services Ordered?: NA Any new equipment or medical supplies ordered?: NA  Functional Questionnaire: Do you need assistance with bathing/showering or dressing?: No Do you need assistance with meal preparation?: No Do you need assistance with eating?: No Do you have difficulty maintaining continence: No Do you need assistance with getting out of bed/getting out of a chair/moving?: No Do you have difficulty managing or taking your medications?: No  Follow up appointments reviewed: PCP Follow-up appointment confirmed?: Yes (declined) MD Provider Line [phone] Given: No Date of PCP follow-up appointment?: 01/11/24 Follow-up Provider: [facility] Follow-up appointment confirmed?: NA Do you need transportation to your follow-up appointment?: No Do you understand care options if your condition(s) worsen?: Yes-patient verbalized understanding    SIGNATURE Darrall Ellison, LPN [facility] Nurse Health Advisor Direct Dial [phone]

## 2024-01-11 ENCOUNTER — Encounter: Payer: Self-pay | Admitting: Family Medicine

## 2024-01-11 ENCOUNTER — Ambulatory Visit (INDEPENDENT_AMBULATORY_CARE_PROVIDER_SITE_OTHER): Payer: Self-pay | Admitting: Family Medicine

## 2024-01-11 VITALS — BP 124/82 | HR 79 | Temp 98.1°F | Wt 398.0 lb

## 2024-01-11 DIAGNOSIS — R931 Abnormal findings on diagnostic imaging of heart and coronary circulation: Secondary | ICD-10-CM

## 2024-01-11 DIAGNOSIS — I517 Cardiomegaly: Secondary | ICD-10-CM

## 2024-01-11 DIAGNOSIS — J810 Acute pulmonary edema: Secondary | ICD-10-CM

## 2024-01-11 DIAGNOSIS — I2609 Other pulmonary embolism with acute cor pulmonale: Secondary | ICD-10-CM | POA: Diagnosis not present

## 2024-01-11 LAB — CBC WITH DIFFERENTIAL/PLATELET
Basophils Absolute: 0.1 10*3/uL (ref 0.0–0.1)
Basophils Relative: 0.8 % (ref 0.0–3.0)
Eosinophils Absolute: 0.2 10*3/uL (ref 0.0–0.7)
Eosinophils Relative: 3 % (ref 0.0–5.0)
HCT: 40.1 % (ref 36.0–46.0)
Hemoglobin: 12.7 g/dL (ref 12.0–15.0)
Lymphocytes Relative: 22.9 % (ref 12.0–46.0)
Lymphs Abs: 1.9 10*3/uL (ref 0.7–4.0)
MCHC: 31.8 g/dL (ref 30.0–36.0)
MCV: 88.7 fl (ref 78.0–100.0)
Monocytes Absolute: 1 10*3/uL (ref 0.1–1.0)
Monocytes Relative: 11.6 % (ref 3.0–12.0)
Neutro Abs: 5.1 10*3/uL (ref 1.4–7.7)
Neutrophils Relative %: 61.7 % (ref 43.0–77.0)
Platelets: 296 10*3/uL (ref 150.0–400.0)
RBC: 4.52 Mil/uL (ref 3.87–5.11)
RDW: 17.5 % — ABNORMAL HIGH (ref 11.5–15.5)
WBC: 8.2 10*3/uL (ref 4.0–10.5)

## 2024-01-11 LAB — COMPREHENSIVE METABOLIC PANEL WITH GFR
ALT: 29 U/L (ref 0–35)
AST: 17 U/L (ref 0–37)
Albumin: 4.1 g/dL (ref 3.5–5.2)
Alkaline Phosphatase: 80 U/L (ref 39–117)
BUN: 17 mg/dL (ref 6–23)
CO2: 31 meq/L (ref 19–32)
Calcium: 9.7 mg/dL (ref 8.4–10.5)
Chloride: 96 meq/L (ref 96–112)
Creatinine, Ser: 0.98 mg/dL (ref 0.40–1.20)
GFR: 60.19 mL/min (ref >60.00)
Glucose, Bld: 119 mg/dL — ABNORMAL HIGH (ref 70–99)
Potassium: 4.3 meq/L (ref 3.5–5.1)
Sodium: 138 meq/L (ref 135–145)
Total Bilirubin: 0.8 mg/dL (ref 0.2–1.2)
Total Protein: 7.7 g/dL (ref 6.0–8.3)

## 2024-01-11 NOTE — Patient Instructions (Addendum)
   Great to see you today.  I have refilled the medication(s) we provide.   If labs were collected or images ordered, we will inform you of  results once we have received them and reviewed. We will contact you either by echart message, or telephone call.  Please give ample time to the testing facility, and our office to run,  receive and review results. Please do not call inquiring of results, even if you can see them in your chart. We will contact you as soon as we are able. If it has been over 1 week since the test was completed, and you have not yet heard from [place], then please call us.    - echart message- for normal results that have been seen by the patient already.   - telephone call: abnormal results or if patient has not viewed results in their echart.  If a referral to a specialist was entered for you, please call us in 2 weeks if you have not heard from the specialist office to schedule.

## 2024-01-11 NOTE — Progress Notes (Unsigned)
 Mckenzie [name] , 1956-08-05, 68 y.o., female MRN: [id number] Patient Care Team    Relationship Specialty Notifications Start End  Mariel Shope, DO PCP - General Family Medicine  03/25/21   Milford Allegra, MD Consulting Physician Obstetrics and Gynecology  03/30/19   Tami Falcon, MD Consulting Physician Gastroenterology  03/30/19   Evalene Hilda, OD  Ophthalmology  09/15/21     Chief Complaint  Patient presents with   Hospitalization Follow-up     Subjective:  Mckenzie [name]  is a 68 y.o. female presents for hospital follow up after recent admission on 01/05/2024 for primary diagnosis bilateral pulmonary embolism.  Patient was discharged on 01/07/2024 to home. Patients discharge summary has been reviewed, as well as all labs/image studies obtained during hospitalization.  Medication reconciliation completed today.  Patients hospital course: Patient was seen by this provider the day prior to her admission for her chronic conditions and was found to appear winded.  Workup was initiated with an elevated D-dimer.  Patient was contacted and told to report to emergency room immediately.  Patient was then admitted with bilateral PE.  She was treated with heparin , Doppler studies were negative for DVT, echocardiogram with EF of 55 to 60%, intermediate diastolic dysfunction and mild to moderate reduced RV SF.  Patient was discharged on Eliquis starter pack Since hospital discharge patient reports since being home she is feeling a little better.  Able to breathe much better.  She is tolerating the Eliquis.  She has not noticed any abnormal bleeding. She reports she has increased arthritis discomfort.  She had been on diclofenac  which had to be discontinued secondary to need of Eliquis. Her blood pressures stabilized nicely during the admission and she was able to discontinue amlodipine  and losartan .  Blood pressures have remained normal without medications.  Recent Labs  Lab 01/06/24 0258  01/07/24 0304 01/11/24 1336  HGB 11.9* 11.4* 12.7  HCT 38.4 37.0 40.1  WBC 15.6* 11.3* 8.2  PLT 300 255 296.0      Latest Ref Rng & Units 01/11/2024    1:36 PM 01/07/2024    3:04 AM 01/06/2024    2:58 AM  CMP  Glucose 70 - 99 mg/dL 086  96  96   BUN 6 - 23 mg/dL 17  19  24    Creatinine 0.40 - 1.20 mg/dL 5.78  4.69  6.29   Sodium 135 - 145 mEq/L 138  140  140   Potassium 3.5 - 5.1 mEq/L 4.3  4.1  4.5   Chloride 96 - 112 mEq/L 96  102  102   CO2 19 - 32 mEq/L 31  27  26    Calcium  8.4 - 10.5 mg/dL 9.7  9.0  9.6   Total Protein 6.0 - 8.3 g/dL 7.7   7.8   Total Bilirubin 0.2 - 1.2 mg/dL 0.8   0.6   Alkaline Phos 39 - 117 U/L 80   66   AST 0 - 37 U/L 17   22   ALT 0 - 35 U/L 29   34       VAS US  LOWER EXTREMITY VENOUS (DVT) Result Date: 01/07/2024 Summary:  BILATERAL: -No evidence of popliteal cyst, bilaterally.  RIGHT: - There is no evidence of deep vein thrombosis in the lower extremity. However, portions of this examination were limited- see technologist comments above.   LEFT: - There is no evidence of deep vein thrombosis in the lower extremity. However, portions of this  examination were limited- see technologist comments above.   ECHOCARDIOGRAM COMPLETE FINDINGS   Left Ventricle: Left ventricular ejection fraction, by estimation, is 55 to 60%. The left ventricle has normal function. The left ventricle has no regional wall motion abnormalities. Definity contrast agent was given IV to delineate the left ventricular  endocardial borders. The left ventricular internal cavity size was normal in size. There is mild concentric left ventricular hypertrophy. Left ventricular diastolic parameters are indeterminate.  Right Ventricle: The right ventricular size is moderately enlarged. No increase in right ventricular wall thickness. Right ventricular systolic function is moderately reduced.  Left Atrium: Left atrial size was normal in size.  Right Atrium: Right atrial size was normal in size.   Pericardium: There is no evidence of pericardial effusion.  Mitral Valve: The mitral valve is normal in structure. No evidence of mitral valve regurgitation. No evidence of mitral valve stenosis.  Tricuspid Valve: The tricuspid valve is normal in structure. Tricuspid valve regurgitation is not demonstrated. No evidence of tricuspid stenosis.  Aortic Valve: The aortic valve is normal in structure. Aortic valve regurgitation is not visualized. No aortic stenosis is present. Aortic valve mean gradient measures 7.0 mmHg. Aortic valve peak gradient measures 13.2 mmHg. Aortic valve area, by VTI measures 2.23 cm.  Pulmonic Valve: The pulmonic valve was normal in structure. Pulmonic valve regurgitation is not visualized. No evidence of pulmonic stenosis.  Aorta: The aortic root is normal in size and structure.  Venous: The inferior vena cava is dilated in size with less than 50% respiratory variability, suggesting right atrial pressure of 15 mmHg.  Result Date: 01/05/2024  IMPRESSION: Bilateral pulmonary emboli are noted. Positive for acute PE with CT evidence of right heart strain (RV/LV Ratio = 1.6) consistent with at least submassive (intermediate risk) PE. The presence of right heart strain has been associated with an increased risk of morbidity and mortality.   DG Chest 2 View Result Date: 01/05/2024 iMPRESSION: Findings favor congestive heart failure/pulmonary edema. Electronically Signed   By: Beula Brunswick M.D.   On: 01/05/2024 15:04   DG Chest 2 View Result Date: 01/04/2024 IMPRESSION: Mild cardiomegaly with central pulmonary vascular congestion and possible minimal pulmonary edema. Electronically Signed   By: Rosalene Colon M.D.   On: 01/04/2024 14:51       02/18/2023    9:28 AM 08/17/2022    1:15 PM 09/15/2021    1:49 PM 08/14/2020   11:32 AM 04/01/2020   11:19 AM  Depression screen PHQ 2/9  Decreased Interest 0 0 0 0 0  Down, Depressed, Hopeless 0 0 0 0 0  PHQ - 2 Score 0 0 0 0 0   Altered sleeping   0    Tired, decreased energy   1    Change in appetite   0    Feeling bad or failure about yourself    0    Trouble concentrating   0    Moving slowly or fidgety/restless   0    Suicidal thoughts   0    PHQ-9 Score   1      No Known Allergies Social History   Tobacco Use   Smoking status: Never    Passive exposure: Never   Smokeless tobacco: Never  Substance Use Topics   Alcohol use: Yes    Comment: soically   Past Medical History:  Diagnosis Date   Arthritis    Chicken pox    Diabetes mellitus without complication (HCC)  Gout    Hypertension    Myalgia 12/02/2021   Polyarthralgia 12/02/2021   UTI (urinary tract infection)    Past Surgical History:  Procedure Laterality Date   NO PAST SURGERIES     Family History  Problem Relation Age of Onset   Heart failure Mother    Alcohol abuse Father    COPD Sister    Liver cancer Sister    Alcohol abuse Sister    Nocturnal enuresis Sister    Kidney disease Maternal Grandmother    BRCA 1/2 Neg Hx    Allergies as of 01/11/2024   No Known Allergies      Medication List        Accurate as of Jan 11, 2024 11:59 PM. If you have any questions, ask your nurse or doctor.          STOP taking these medications    furosemide  20 MG tablet Commonly known as: LASIX  Stopped by: Mckenzie [name]   losartan  50 MG tablet Commonly known as: COZAAR  Stopped by: Mckenzie [name]   potassium chloride  SA 20 MEQ tablet Commonly known as: KLOR-CON  M Stopped by: Mckenzie [name]       TAKE these medications    acetaminophen 500 MG tablet Commonly known as: TYLENOL Take 2 tablets (1,000 mg total) by mouth every 6 (six) hours as needed for mild pain (pain score 1-3) or headache.   albuterol  108 (90 Base) MCG/ACT inhaler Commonly known as: VENTOLIN  HFA Inhale 2 puffs into the lungs every 6 (six) hours as needed for wheezing or shortness of breath.   allopurinol  300 MG tablet Commonly known as: ZYLOPRIM  Take  1 tablet (300 mg total) by mouth daily.   atorvastatin  40 MG tablet Commonly known as: LIPITOR Take 1 tablet (40 mg total) by mouth daily.   baclofen  20 MG tablet Commonly known as: LIORESAL  Take 1 tablet (20 mg total) by mouth 3 (three) times daily.   colchicine  0.6 MG tablet Take 1 tablet (0.6 mg total) by mouth 2 (two) times daily as needed.   Eliquis DVT/PE Starter Pack Generic drug: Apixaban Starter Pack (10mg  and 5mg ) Take as directed on package: start with two-5mg  tablets twice daily for 7 days. On day 8, switch to one-5mg  tablet twice daily.   apixaban 5 MG Tabs tablet Commonly known as: ELIQUIS Take 1 tablet (5 mg total) by mouth 2 (two) times daily. Start after you finish the starter pack Start taking on: February 06, 2024   Fish Oil 500 MG Caps Take by mouth.   glipiZIDE  5 MG tablet Commonly known as: GLUCOTROL  Take 1 tablet (5 mg total) by mouth daily before breakfast.   JOINT HEALTH PO Take by mouth.   multivitamin capsule Take 1 capsule by mouth daily.   ondansetron  4 MG tablet Commonly known as: Zofran  Take 1 tablet (4 mg total) by mouth every 6 (six) hours as needed for up to 5 days for nausea or vomiting.   PROBIOTIC BLEND PO Take by mouth.   traMADol  50 MG tablet Commonly known as: ULTRAM  1 tab PO in the day and 2 tabs qhs   valACYclovir  1000 MG tablet Commonly known as: VALTREX  2 po at outbreak onset then repeat in 12h for each outbreak        All past medical history, surgical history, allergies, family history, immunizations and medications were updated in the EMR today and reviewed under the history and medication portions of their EMR.      ROS: Negative,  with the exception of above mentioned in HPI   Objective:  BP 124/82   Pulse 79   Temp 98.1 F (36.7 C)   Wt (!) 398 lb (180.5 kg)   SpO2 93%   BMI 53.98 kg/m  Body mass index is 53.98 kg/m. Physical Exam Vitals and nursing note reviewed.  Constitutional:      General: She  is not in acute distress.    Appearance: Normal appearance. She is obese. She is not ill-appearing, toxic-appearing or diaphoretic.  HENT:     Head: Normocephalic and atraumatic.  Eyes:     General: No scleral icterus.       Right eye: No discharge.        Left eye: No discharge.     Extraocular Movements: Extraocular movements intact.     Conjunctiva/sclera: Conjunctivae normal.     Pupils: Pupils are equal, round, and reactive to light.  Cardiovascular:     Rate and Rhythm: Normal rate and regular rhythm.     Heart sounds: No murmur heard. Pulmonary:     Effort: Pulmonary effort is normal. No respiratory distress.     Breath sounds: Normal breath sounds. No wheezing, rhonchi or rales.  Musculoskeletal:     Cervical back: Neck supple.     Right lower leg: No edema.     Left lower leg: No edema.  Skin:    General: Skin is warm.     Findings: No rash.  Neurological:     Mental Status: She is alert and oriented to person, place, and time. Mental status is at baseline.     Motor: No weakness.     Gait: Gait normal.  Psychiatric:        Mood and Affect: Mood normal.        Behavior: Behavior normal.        Thought Content: Thought content normal.        Judgment: Judgment normal.     Assessment/Plan: Mckenzie [name] is a 68 y.o. female present for OV for Hospital discharge follow up Acute pulmonary embolism with acute cor pulmonale, unspecified pulmonary embolism type (HCC) (Primary) Patient is feeling better. compliant with Eliquis starter pack, has prescription for twice daily dosing after starter pack. We discussed provoked versus unprovoked PE.  Considering she had a rather significant bilateral PE with right heart strain, I recommend we refer her to hematology for further evaluation on possible hematological causes and recommendations on length of Eliquis use. - Comprehensive metabolic panel with GFR - CBC w/Diff Patient will need a letter to be able to return to work  with restrictions, we will provide this for her   Cardiomegaly/Left ventricular hypertrophy/Abnormal echocardiogram/Acute pulmonary edema (HCC) Refer to cardiology placed today due to changes appreciated on her echocardiogram and image studies.  Hypertension: Patient encouraged to continue to monitor her blood pressures.  Since hospital admission, she was able to come off both of her amlodipine  and losartan .  Arthritis pain: With Eliquis use, diclofenac  has been discontinued. Can continue baclofen  as needed. Can can continue tramadol  as needed.   Reviewed expectations re: course of current medical issues. Discussed self-management of symptoms. Outlined signs and symptoms indicating need for more acute intervention. Patient verbalized understanding and all questions were answered. Patient received an After-Visit Summary. Any changes in medications were reviewed and patient was provided with updated med list with their AVS.     Orders Placed This Encounter  Procedures   Comprehensive metabolic panel with GFR  CBC w/Diff   Ambulatory referral to Hematology / Oncology   Ambulatory referral to Cardiology     Note is dictated utilizing voice recognition software. Although note has been proof read prior to signing, occasional typographical errors still can be missed. If any questions arise, please do not hesitate to call for verification.   electronically signed by:  Mckenzie Backbone, DO  [facility] [place]

## 2024-01-12 ENCOUNTER — Encounter: Payer: Self-pay | Admitting: Family Medicine

## 2024-01-12 ENCOUNTER — Telehealth: Payer: Self-pay | Admitting: Family Medicine

## 2024-01-12 DIAGNOSIS — R931 Abnormal findings on diagnostic imaging of heart and coronary circulation: Secondary | ICD-10-CM | POA: Insufficient documentation

## 2024-01-12 DIAGNOSIS — I517 Cardiomegaly: Secondary | ICD-10-CM | POA: Insufficient documentation

## 2024-01-12 HISTORY — DX: Abnormal findings on diagnostic imaging of heart and coronary circulation: R93.1

## 2024-01-12 MED ORDER — TRAMADOL HCL 50 MG PO TABS: ORAL_TABLET | ORAL | 5 refills | Status: DC

## 2024-01-12 MED ORDER — BACLOFEN 20 MG PO TABS: 20.0000 mg | ORAL_TABLET | Freq: Three times a day (TID) | ORAL | 5 refills | Status: DC

## 2024-01-12 NOTE — Telephone Encounter (Signed)
 Pt aware and verbalized understanding. Letter placed in folder upfront.

## 2024-01-12 NOTE — Telephone Encounter (Signed)
 Please call patient Please inform her I have written her the work excuse we discussed during her visit.  We can mail this to her if she desires.   I placed the referral we discussed during her visit to hematology, so they should be reaching out to her to get her scheduled. I also placed a referral to cardiology for her.  They also should be reaching out to her to get her scheduled.  Since she had heart strain from her pulmonary embolism and mildly enlarged heart visualized on images with an abnormal echocardiogram, she should follow-up with cardiology to ensure no long-term cardiac consequences present.

## 2024-01-25 ENCOUNTER — Other Ambulatory Visit: Payer: Self-pay | Admitting: Family Medicine

## 2024-01-25 DIAGNOSIS — Z1231 Encounter for screening mammogram for malignant neoplasm of breast: Secondary | ICD-10-CM

## 2024-02-06 ENCOUNTER — Encounter: Payer: Self-pay | Admitting: Oncology

## 2024-02-06 ENCOUNTER — Inpatient Hospital Stay (HOSPITAL_BASED_OUTPATIENT_CLINIC_OR_DEPARTMENT_OTHER): Payer: MEDICAID | Admitting: Oncology

## 2024-02-06 ENCOUNTER — Telehealth: Payer: Self-pay

## 2024-02-06 ENCOUNTER — Other Ambulatory Visit (HOSPITAL_COMMUNITY): Payer: Self-pay | Admitting: Family Medicine

## 2024-02-06 ENCOUNTER — Inpatient Hospital Stay: Payer: MEDICAID | Attending: Oncology

## 2024-02-06 VITALS — BP 146/74 | HR 114 | Temp 97.9°F | Resp 20 | Ht 72.0 in | Wt 387.6 lb

## 2024-02-06 DIAGNOSIS — E669 Obesity, unspecified: Secondary | ICD-10-CM

## 2024-02-06 DIAGNOSIS — I2609 Other pulmonary embolism with acute cor pulmonale: Secondary | ICD-10-CM

## 2024-02-06 DIAGNOSIS — Z7901 Long term (current) use of anticoagulants: Secondary | ICD-10-CM | POA: Diagnosis not present

## 2024-02-06 DIAGNOSIS — I2699 Other pulmonary embolism without acute cor pulmonale: Secondary | ICD-10-CM | POA: Diagnosis present

## 2024-02-06 DIAGNOSIS — I10 Essential (primary) hypertension: Secondary | ICD-10-CM | POA: Insufficient documentation

## 2024-02-06 LAB — CMP (CANCER CENTER ONLY)
ALT: 19 U/L (ref 0–44)
AST: 21 U/L (ref 15–41)
Albumin: 3.7 g/dL (ref 3.5–5.0)
Alkaline Phosphatase: 83 U/L (ref 38–126)
Anion gap: 14 (ref 5–15)
BUN: 17 mg/dL (ref 8–23)
CO2: 26 mmol/L (ref 22–32)
Calcium: 10.4 mg/dL — ABNORMAL HIGH (ref 8.9–10.3)
Chloride: 101 mmol/L (ref 98–111)
Creatinine: 0.93 mg/dL (ref 0.44–1.00)
GFR, Estimated: >60 mL/min (ref >60)
Glucose, Bld: 128 mg/dL — ABNORMAL HIGH (ref 70–99)
Potassium: 4.1 mmol/L (ref 3.5–5.1)
Sodium: 141 mmol/L (ref 135–145)
Total Bilirubin: 0.5 mg/dL (ref 0.0–1.2)
Total Protein: 8.4 g/dL — ABNORMAL HIGH (ref 6.5–8.1)

## 2024-02-06 LAB — CBC WITH DIFFERENTIAL (CANCER CENTER ONLY)
Abs Immature Granulocytes: 0.04 10*3/uL (ref 0.00–0.07)
Basophils Absolute: 0 10*3/uL (ref 0.0–0.1)
Basophils Relative: 0 %
Eosinophils Absolute: 0.3 10*3/uL (ref 0.0–0.5)
Eosinophils Relative: 3 %
HCT: 36.6 % (ref 36.0–46.0)
Hemoglobin: 11.4 g/dL — ABNORMAL LOW (ref 12.0–15.0)
Immature Granulocytes: 1 %
Lymphocytes Relative: 22 %
Lymphs Abs: 1.8 10*3/uL (ref 0.7–4.0)
MCH: 27.5 pg (ref 26.0–34.0)
MCHC: 31.1 g/dL (ref 30.0–36.0)
MCV: 88.2 fL (ref 80.0–100.0)
Monocytes Absolute: 0.5 10*3/uL (ref 0.1–1.0)
Monocytes Relative: 7 %
Neutro Abs: 5.5 10*3/uL (ref 1.7–7.7)
Neutrophils Relative %: 67 %
Platelet Count: 304 10*3/uL (ref 150–400)
RBC: 4.15 MIL/uL (ref 3.87–5.11)
RDW: 16.3 % — ABNORMAL HIGH (ref 11.5–15.5)
WBC Count: 8.2 10*3/uL (ref 4.0–10.5)
nRBC: 0 % (ref 0.0–0.2)

## 2024-02-06 LAB — D-DIMER, QUANTITATIVE: D-Dimer, Quant: 1.06 ug{FEU}/mL — ABNORMAL HIGH (ref 0.00–0.50)

## 2024-02-06 NOTE — Telephone Encounter (Signed)
 I called patient to discuss applying for medication assistance program for Eliquis . She reports that she has a discount coupon for Eliquis  (She used the once per lifetime free coupon when she received Eliquis  at discharge).  I discussed that if she does not have insurance she could be eligible for medication assistance program. Patient thinks that she was just approved for Medicare and that Eliquis  will be covered.  I provided my number. I will also be in the [facility] office tomorrow morning and can meet with her to start application for Eliquis  medication assistance program if needed.

## 2024-02-06 NOTE — Telephone Encounter (Signed)
 Mckenzie [name] called back. She states she was told that pharmacy should be able to process Eliquis  tomorrow. She has my phone number in case she has any further issues or questions. [phone])

## 2024-02-06 NOTE — Telephone Encounter (Signed)
 Copied from CRM [id number]. Topic: Clinical - Prescription Issue >> Feb 06, 2024  8:48 AM Mckenzie [name] wrote: Reason for CRM: pt called regarding APIXABAN  (ELIQUIS ) VTE STARTER PACK (10MG  AND 5MG ). Pt states she can not afford the medication, is in the process of applying for medicaid. Pt is requesting to speak with the Pharmacist or provider regarding assistance with the medication. Please call pt back at [phone]  Please assist with patient assistance program

## 2024-02-06 NOTE — Assessment & Plan Note (Addendum)
 Bilateral pulmonary embolism diagnosed on 01/05/2024 after presenting with dyspnea and headache.   No provoking factors identified, and leg ultrasound was negative for DVT.   Currently on Eliquis , with a plan for at least six months of anticoagulation due to the unprovoked nature and submassive size of the emboli.   Discussed high recurrence risk without anticoagulation, with a 25% chance of recurrence if stopped after one year. Potential need for lifelong reduced-dose anticoagulation was discussed. Explained risks and benefits of Eliquis , noting its lower bleeding risk compared to Coumadin and lack of dietary interactions.   Given unprovoked pulmonary embolism, it would be prudent to rule out underlying thrombophilia.  Hence today we requested factor V Leiden mutation testing, prothrombin gene mutation testing, beta-2 glycoprotein antibodies, anticardiolipin antibodies, lupus anticoagulant.  Rest of the hypercoagulable workup will be deferred as it can be falsely abnormal given recent embolism and ongoing Eliquis  use.  D-dimer is better at 1.06 today, compared to 2.35 previously.  - Continue Eliquis  5 mg twice daily  She is up-to-date on age-appropriate cancer screening  - Call next week to discuss blood test results  - Schedule follow-up in three months to reassess and perform blood work

## 2024-02-06 NOTE — Progress Notes (Signed)
 [facility]  HEMATOLOGY CLINIC CONSULTATION NOTE   PATIENT NAME: Mckenzie [name]   MR#: [id number] DOB: 06/29/1956  DATE OF SERVICE: 02/06/2024   REFERRING PROVIDER  Mariel Shope, DO   Patient Care Team: Mariel Shope, DO as PCP - General (Family Medicine) Milford Allegra, MD as Consulting Physician (Obstetrics and Gynecology) Tami Falcon, MD as Consulting Physician (Gastroenterology) Evalene Hilda, OD (Ophthalmology)   REASON FOR CONSULTATION/ CHIEF COMPLAINT:  Acute bilateral pulmonary embolism  ASSESSMENT & PLAN:  Mckenzie [name] is a 68 y.o. lady with a past medical history of diabetes mellitus, hypertension, polyarthralgia, myalgia, was referred to our service for evaluation of possible hypercoagulable state, given acute bilateral pulmonary embolism diagnosed in May 2025.    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   No provoking factors identified, and leg ultrasound was negative for DVT.   Currently on Eliquis , with a plan for at least six months of anticoagulation due to the unprovoked nature and submassive size of the emboli.   Discussed high recurrence risk without anticoagulation, with a 25% chance of recurrence if stopped after one year. Potential need for lifelong reduced-dose anticoagulation was discussed. Explained risks and benefits of Eliquis , noting its lower bleeding risk compared to Coumadin and lack of dietary interactions.   Given unprovoked pulmonary embolism, it would be prudent to rule out underlying thrombophilia.  Hence today we requested factor V Leiden mutation testing, prothrombin gene mutation testing, beta-2 glycoprotein antibodies, anticardiolipin antibodies, lupus anticoagulant.  Rest of the hypercoagulable workup will be deferred as it can be falsely abnormal given recent embolism and ongoing Eliquis   use.  D-dimer is better at 1.06 today, compared to 2.35 previously.  - Continue Eliquis  5 mg twice daily  She is up-to-date on age-appropriate cancer screening  - Call next week to discuss blood test results  - Schedule follow-up in three months to reassess and perform blood work  Obesity Obesity identified as a potential contributing factor to dyspnea prior to the diagnosis of pulmonary embolism.  General Health Maintenance She is up to date on mammograms, colonoscopies, and eye exams. Hypertension is well-controlled without medication. - Continue routine health maintenance and screenings as scheduled   I reviewed lab results and outside records for this visit and discussed relevant results with the patient. Diagnosis, plan of care and treatment options were also discussed in detail with the patient. Opportunity provided to ask questions and answers provided to her apparent satisfaction. Provided instructions to call our clinic with any problems, questions or concerns prior to return visit. I recommended to continue follow-up with PCP and sub-specialists. She verbalized understanding and agreed with the plan. No barriers to learning was detected.  [name], MD  02/06/2024 4:54 PM  [facility] - A DEPT OF [facility] [id number] [place] [address] Dept: [phone] Dept Fax: [phone]   HISTORY OF PRESENT ILLNESS:   Discussed the use of AI scribe software for clinical note transcription with the patient, who gave verbal consent to proceed.  History of Present Illness Mckenzie [name] is a 68 year old female who presents with blood clots in both lungs.  She experienced blood clots in both lungs, discovered after seeking medical attention on 01/05/2024 for persistent shortness of breath and a headache following a short flight of about 45 minutes. She had  been feeling unwell for some time,  attributing her symptoms to obesity, but realized the severity of her condition after the flight. A D-dimer test revealed extremely high levels, leading to a two-day hospital stay where she was started on Eliquis .  She has been taking Eliquis , one tablet twice a day, and today marks the last of her 30-day pack. She is in the process of securing a refill, with assistance from her social worker, Elayne Greaser, to find the most affordable option due to recent changes in her Medicare and Medicaid status.  She has no history of surgeries or family history of similar conditions. Her leg ultrasound was negative for clots. No history of chest pain or trouble breathing currently. She is up to date on mammograms and colonoscopies, with the next mammogram scheduled for the end of the month. Her blood pressure is well-controlled, and she is no longer on blood pressure medication.  She travels frequently by air and had been experiencing shortness of breath for some time before the recent incident. She drinks a couple of quarts of water daily and takes magnesium  supplements but no new medications or over-the-counter supplements recently. No history of palpitations or fast heartbeat at home.  Negative Thrombotic Risk Factors: Previous VTE, Recent surgery (within 3 months), Recent trauma (within 3 months), Recent admission to hospital with acute illness (within 3 months), Paralysis, paresis, or recent plaster cast immobilization of lower extremity, Central venous catheterization, Bed rest >72 hours within 3 months, Sedentary journey lasting >8 hours within 4 weeks, Pregnancy, Recent cesarean section (within 3 months), Estrogen therapy, Testosterone therapy, Erythropoiesis-stimulating agent, Recent COVID diagnosis (within 3 months), Active cancer, Non-malignant, chronic inflammatory condition, Known thrombophilic condition, Smoking.   MEDICAL HISTORY Past Medical History:  Diagnosis Date   Arthritis    Chicken pox     Diabetes mellitus without complication (HCC)    Gout    Hypertension    Myalgia 12/02/2021   Polyarthralgia 12/02/2021   UTI (urinary tract infection)      SURGICAL HISTORY Past Surgical History:  Procedure Laterality Date   NO PAST SURGERIES       SOCIAL HISTORY: She reports that she has never smoked. She has never been exposed to tobacco smoke. She has never used smokeless tobacco. She reports current alcohol use. She reports that she does not currently use drugs. Social History   Socioeconomic History   Marital status: Single    Spouse name: Not on file   Number of children: 0   Years of education: Not on file   Highest education level: Master's degree (e.g., MA, MS, MEng, MEd, MSW, MBA)  Occupational History   Occupation: MISSIONARY    Employer: [employer]  Tobacco Use   Smoking status: Never    Passive exposure: Never   Smokeless tobacco: Never  Vaping Use   Vaping status: Never Used  Substance and Sexual Activity   Alcohol use: Yes    Comment: soically   Drug use: Not Currently   Sexual activity: Not Currently    Birth control/protection: Post-menopausal  Other Topics Concern   Not on file  Social History Narrative   Marital status:  Single. 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  Speaking engagements.   Safety:      -Wears a bicycle helmet riding a bike: Yes     -smoke alarm in the home:Yes     - Feels safe in their relationships: Yes  Social Drivers of Corporate investment banker Strain: Low Risk  (08/23/2023)   Overall Financial Resource Strain (CARDIA)    Difficulty of Paying Living Expenses: Not very hard  Food Insecurity: No Food Insecurity (02/06/2024)   Hunger Vital Sign    Worried About Running Out of Food in the Last Year: Never true    Ran Out of Food in the Last Year: Never true  Transportation Needs: No Transportation Needs (02/06/2024)   PRAPARE - Scientist, research (physical sciences) (Medical): No    Lack of Transportation (Non-Medical): No  Physical Activity: Insufficiently Active (08/23/2023)   Exercise Vital Sign    Days of Exercise per Week: 3 days    Minutes of Exercise per Session: 10 min  Stress: No Stress Concern Present (08/23/2023)   [employer] of Occupational Health - Occupational Stress Questionnaire    Feeling of Stress : Not at all  Social Connections: Moderately Integrated (01/06/2024)   Social Connection and Isolation Panel [NHANES]    Frequency of Communication with Friends and Family: More than three times a week    Frequency of Social Gatherings with Friends and Family: More than three times a week    Attends Religious Services: More than 4 times per year    Active Member of [employer] or Organizations: Yes    Attends Banker Meetings: More than 4 times per year    Marital Status: Never married  Intimate Partner Violence: Not At Risk (02/06/2024)   Humiliation, Afraid, Rape, and Kick questionnaire    Fear of Current or Ex-Partner: No    Emotionally Abused: No    Physically Abused: No    Sexually Abused: No    FAMILY HISTORY: Her family history includes Alcohol abuse in her father and sister; COPD in her sister; Heart failure in her mother; Kidney disease in her maternal grandmother; Liver cancer in her sister; Nocturnal enuresis in her sister.  CURRENT MEDICATIONS   Current Outpatient Medications  Medication Instructions   albuterol  (VENTOLIN  HFA) 108 (90 Base) MCG/ACT inhaler 2 puffs, Inhalation, Every 6 hours PRN   allopurinol  (ZYLOPRIM ) 300 mg, Oral, Daily   APIXABAN  (ELIQUIS ) VTE STARTER PACK (10MG  AND 5MG ) Take as directed on package: start with two-5mg  tablets twice daily for 7 days. On day 8, switch to one-5mg  tablet twice daily.   apixaban  (ELIQUIS ) 5 mg, Oral, 2 times daily, Start after you finish the starter pack   atorvastatin  (LIPITOR) 40 mg, Oral, Daily   baclofen  (LIORESAL ) 20 mg, Oral, 3 times daily    colchicine  0.6 mg, Oral, 2 times daily PRN   Misc Natural Products (JOINT HEALTH PO) Take by mouth.   Multiple Vitamin (MULTIVITAMIN) capsule 1 capsule, Daily   Omega-3 Fatty Acids (FISH OIL) 500 MG CAPS Take by mouth.   Probiotic Product (PROBIOTIC BLEND PO) Take by mouth.   traMADol  (ULTRAM ) 50 MG tablet 1 tab PO in the day and 2 tabs qhs   valACYclovir  (VALTREX ) 1000 MG tablet 2 po at outbreak onset then repeat in 12h for each outbreak     ALLERGIES  She has no known allergies.  REVIEW OF SYSTEMS:  Review of Systems - Oncology   Rest of the pertinent review of systems is unremarkable except as mentioned above in HPI.  PHYSICAL EXAMINATION:    Onc Performance Status - 02/06/24 1349       ECOG Perf Status   ECOG Perf Status Restricted in physically strenuous activity but ambulatory and able  to carry out work of a light or sedentary nature, e.g., light house work, office work      KPS SCALE   KPS % SCORE Able to carry on normal activity, minor s/s of disease             Vitals:   02/06/24 1340  BP: (!) 146/74  Pulse: (!) 114  Resp: 20  Temp: 97.9 F (36.6 C)  SpO2: 100%   Filed Weights   02/06/24 1340  Weight: (!) 387 lb 9.6 oz (175.8 kg)    Physical Exam Constitutional:      General: She is not in acute distress.    Appearance: She is obese.  HENT:     Head: Normocephalic and atraumatic.  Cardiovascular:     Rate and Rhythm: Normal rate.     Heart sounds: Normal heart sounds.  Pulmonary:     Effort: Pulmonary effort is normal. No respiratory distress.     Breath sounds: Normal breath sounds.  Abdominal:     General: There is no distension.  Neurological:     General: No focal deficit present.     Mental Status: She is alert and oriented to person, place, and time.  Psychiatric:        Mood and Affect: Mood normal.        Behavior: Behavior normal.      LABORATORY DATA:   I have reviewed the data as listed.  Results for orders placed or  performed in visit on 02/06/24  D-dimer, quantitative  Result Value Ref Range   D-Dimer, Quant 1.06 (H) 0.00 - 0.50 ug/mL-FEU  CMP (Cancer Center only)  Result Value Ref Range   Sodium 141 135 - 145 mmol/L   Potassium 4.1 3.5 - 5.1 mmol/L   Chloride 101 98 - 111 mmol/L   CO2 26 22 - 32 mmol/L   Glucose, Bld 128 (H) 70 - 99 mg/dL   BUN 17 8 - 23 mg/dL   Creatinine 1.61 0.96 - 1.00 mg/dL   Calcium  10.4 (H) 8.9 - 10.3 mg/dL   Total Protein 8.4 (H) 6.5 - 8.1 g/dL   Albumin 3.7 3.5 - 5.0 g/dL   AST 21 15 - 41 U/L   ALT 19 0 - 44 U/L   Alkaline Phosphatase 83 38 - 126 U/L   Total Bilirubin 0.5 0.0 - 1.2 mg/dL   GFR, Estimated >04 >54 mL/min   Anion gap 14 5 - 15  CBC with Differential (Cancer Center Only)  Result Value Ref Range   WBC Count 8.2 4.0 - 10.5 K/uL   RBC 4.15 3.87 - 5.11 MIL/uL   Hemoglobin 11.4 (L) 12.0 - 15.0 g/dL   HCT 09.8 11.9 - 14.7 %   MCV 88.2 80.0 - 100.0 fL   MCH 27.5 26.0 - 34.0 pg   MCHC 31.1 30.0 - 36.0 g/dL   RDW 82.9 (H) 56.2 - 13.0 %   Platelet Count 304 150 - 400 K/uL   nRBC 0.0 0.0 - 0.2 %   Neutrophils Relative % 67 %   Neutro Abs 5.5 1.7 - 7.7 K/uL   Lymphocytes Relative 22 %   Lymphs Abs 1.8 0.7 - 4.0 K/uL   Monocytes Relative 7 %   Monocytes Absolute 0.5 0.1 - 1.0 K/uL   Eosinophils Relative 3 %   Eosinophils Absolute 0.3 0.0 - 0.5 K/uL   Basophils Relative 0 %   Basophils Absolute 0.0 0.0 - 0.1 K/uL   Immature Granulocytes 1 %  Abs Immature Granulocytes 0.04 0.00 - 0.07 K/uL     RADIOGRAPHIC STUDIES:  No pertinent imaging studies available to review.  Orders Placed This Encounter  Procedures   CBC with Differential (Cancer Center Only)    Standing Status:   Future    Number of Occurrences:   1    Expiration Date:   02/05/2025   CMP (Cancer Center only)    Standing Status:   Future    Number of Occurrences:   1    Expiration Date:   02/05/2025   D-dimer, quantitative    Standing Status:   Future    Number of Occurrences:   1     Expiration Date:   02/05/2025   Beta-2-glycoprotein i abs, IgG/M/A    Standing Status:   Future    Number of Occurrences:   1    Expiration Date:   02/05/2025   Cardiolipin antibodies, IgG, IgM, IgA    Standing Status:   Future    Number of Occurrences:   1    Expiration Date:   02/05/2025   Lupus anticoagulant panel    Standing Status:   Future    Number of Occurrences:   1    Expiration Date:   02/05/2025   Prothrombin gene mutation    Standing Status:   Future    Number of Occurrences:   1    Expiration Date:   02/05/2025   Factor 5 leiden    Standing Status:   Future    Number of Occurrences:   1    Expiration Date:   02/05/2025    Future Appointments  Date Time Provider Department Center  03/01/2024  9:00 AM [facility]  05/09/2024  1:40 PM Sheryle Donning, MD [facility]  06/20/2024  1:20 PM [name], [name], DO [facility]    I spent a total of 55 minutes during this encounter with the patient including review of chart and various tests results, discussions about plan of care and coordination of care plan.  This document was completed utilizing speech recognition software. Grammatical errors, random word insertions, pronoun errors, and incomplete sentences are an occasional consequence of this system due to software limitations, ambient noise, and hardware issues. Any formal questions or concerns about the content, text or information contained within the body of this dictation should be directly addressed to the provider for clarification.

## 2024-02-06 NOTE — Telephone Encounter (Signed)
 FYI reviewed

## 2024-02-07 LAB — LUPUS ANTICOAGULANT PANEL
DRVVT: 89.7 s — ABNORMAL HIGH (ref 0.0–47.0)
PTT Lupus Anticoagulant: 39.9 s (ref 0.0–43.5)

## 2024-02-07 LAB — DRVVT CONFIRM: dRVVT Confirm: 1.1 ratio (ref 0.8–1.2)

## 2024-02-07 LAB — CARDIOLIPIN ANTIBODIES, IGG, IGM, IGA
Anticardiolipin IgA: <9 U/mL (ref 0–11)
Anticardiolipin IgG: <9 GPL U/mL (ref 0–14)
Anticardiolipin IgM: <9 [MPL'U]/mL (ref 0–12)

## 2024-02-07 LAB — DRVVT MIX: dRVVT Mix: 60.5 s — ABNORMAL HIGH (ref 0.0–40.4)

## 2024-02-08 LAB — BETA-2-GLYCOPROTEIN I ABS, IGG/M/A
Beta-2 Glyco I IgG: <9 GPI IgG units (ref 0–20)
Beta-2-Glycoprotein I IgA: <9 GPI IgA units (ref 0–25)
Beta-2-Glycoprotein I IgM: <9 GPI IgM units (ref 0–32)

## 2024-02-09 LAB — PROTHROMBIN GENE MUTATION

## 2024-02-13 LAB — FACTOR 5 LEIDEN

## 2024-02-14 ENCOUNTER — Telehealth: Payer: Self-pay | Admitting: Oncology

## 2024-02-14 ENCOUNTER — Inpatient Hospital Stay (HOSPITAL_BASED_OUTPATIENT_CLINIC_OR_DEPARTMENT_OTHER): Payer: MEDICAID | Admitting: Oncology

## 2024-02-14 DIAGNOSIS — I2609 Other pulmonary embolism with acute cor pulmonale: Secondary | ICD-10-CM | POA: Diagnosis not present

## 2024-02-14 DIAGNOSIS — Z7901 Long term (current) use of anticoagulants: Secondary | ICD-10-CM

## 2024-02-14 NOTE — Telephone Encounter (Signed)
 Patient has been scheduled for follow-up visit per 02/14/24 LOS.  Pt aware of scheduled appt details.

## 2024-02-14 NOTE — Progress Notes (Signed)
 [facility]  HEMATOLOGY-ONCOLOGY ELECTRONIC VISIT PROGRESS NOTE  PATIENT NAME: Mckenzie [name]   MR#: [id number] DOB: 1956/08/20  DATE OF SERVICE: 02/14/2024  Patient Care Team: Mariel Shope, DO as PCP - General (Family Medicine) Milford Allegra, MD as Consulting Physician (Obstetrics and Gynecology) Tami Falcon, MD as Consulting Physician (Gastroenterology) Evalene Hilda, OD (Ophthalmology)  I connected with the patient via telephone conference and verified that I am speaking with the correct person using two identifiers. The patient's location is at home and I am providing care from the [facility].  I discussed the limitations, risks, security and privacy concerns of performing an evaluation and management service by e-visits and the availability of in person appointments. I also discussed with the patient that there may be a patient responsible charge related to this service. The patient expressed understanding and agreed to proceed.   ASSESSMENT & PLAN:   Mckenzie [name] is a 68 y.o.  lady with a past medical history of diabetes mellitus, hypertension, polyarthralgia, myalgia, was referred to our service for evaluation of possible hypercoagulable state, given acute bilateral pulmonary embolism diagnosed in May 2025.    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   No provoking factors identified, and leg ultrasound was negative for DVT.   Currently on Eliquis , with a plan for at least six months of anticoagulation due to the unprovoked nature and submassive size of the emboli.   Discussed high recurrence risk without anticoagulation, with a 25% chance of recurrence if stopped after one year. Potential need for lifelong reduced-dose anticoagulation was discussed. Explained risks and benefits of Eliquis , noting its lower bleeding risk compared to  Coumadin and lack of dietary interactions.   Given unprovoked pulmonary embolism, we pursued thrombophilia workup on her consultation with us  on 02/06/2024.  Factor V Leiden mutation testing, prothrombin gene mutation testing, beta-2  glycoprotein antibodies, anticardiolipin antibodies, lupus anticoagulant were all unremarkable.  Rest of the hypercoagulable workup will be deferred as it can be falsely abnormal given recent embolism and ongoing Eliquis  use. D-dimer was better at 1.06, compared to 2.35 previously.   Continue Eliquis  5 mg twice daily   She is up-to-date on age-appropriate cancer screening.  Advise on precautions during travel, including staying hydrated, performing ankle exercises, and walking every couple of hours on long flights.    I discussed the assessment and treatment plan with the patient. The patient was provided an opportunity to ask questions and all were answered. The patient agreed with the plan and demonstrated an understanding of the instructions. The patient was advised to call back or seek an in-person evaluation if the symptoms worsen or if the condition fails to improve as anticipated.    I spent 11 minutes over the phone with the patient reviewing test results, discuss management and coordination/planning of care.  [name], MD 02/14/2024 3:53 PM [facility] - A DEPT OF [facility]. [facility] [address] [place] [address] Dept: [phone] Dept Fax: [phone]   INTERVAL HISTORY:  Please see above for problem oriented charting.  The purpose of today's discussion is to explain recent lab results and to formulate plan of care.  Discussed the use of AI scribe software for clinical note transcription with the patient, who gave verbal consent to proceed.  History of Present Illness Mckenzie [name] is a 68 year old  female who presents for follow-up regarding blood clotting  concerns.  She underwent a comprehensive workup for blood clotting disorders. Recent tests for prothrombin gene mutation, Factor V Leiden mutation, lupus anticoagulant, anticardiolipin antibodies, and beta-2  glycoprotein antibodies were all negative. Her D-dimer level has decreased from 2.35 to 1.06. She is currently on Eliquis  for anticoagulation therapy.  Her blood pressure is consistently around 117/60 mmHg, and she is not taking any medication for hypertension, only Eliquis  for her clotting issue.  She inquired about her ability to travel, specifically a planned trip to [place] in December.   SUMMARY OF HEMATOLOGY HISTORY:  She experienced blood clots in both lungs, discovered after seeking medical attention on 01/05/2024 for persistent shortness of breath and a headache following a short flight of about 45 minutes. She had been feeling unwell for some time, attributing her symptoms to obesity, but realized the severity of her condition after the flight. A D-dimer test revealed extremely high levels, leading to a two-day hospital stay where she was started on Eliquis .   She has been taking Eliquis , one tablet twice a day, and today marks the last of her 30-day pack. She is in the process of securing a refill, with assistance from her social worker, Elayne Greaser, to find the most affordable option due to recent changes in her Medicare and Medicaid status.   She has no history of surgeries or family history of similar conditions. Her leg ultrasound was negative for clots. No history of chest pain or trouble breathing currently. She is up to date on mammograms and colonoscopies, with the next mammogram scheduled for the end of the month. Her blood pressure is well-controlled, and she is no longer on blood pressure medication.   She travels frequently by air and had been experiencing shortness of breath for some time before the recent incident. She drinks a couple of quarts of water daily and takes  magnesium  supplements but no new medications or over-the-counter supplements recently. No history of palpitations or fast heartbeat at home.   Negative Thrombotic Risk Factors: Previous VTE, Recent surgery (within 3 months), Recent trauma (within 3 months), Recent admission to hospital with acute illness (within 3 months), Paralysis, paresis, or recent plaster cast immobilization of lower extremity, Central venous catheterization, Bed rest >72 hours within 3 months, Sedentary journey lasting >8 hours within 4 weeks, Pregnancy, Recent cesarean section (within 3 months), Estrogen therapy, Testosterone therapy, Erythropoiesis-stimulating agent, Recent COVID diagnosis (within 3 months), Active cancer, Non-malignant, chronic inflammatory condition, Known thrombophilic condition, Smoking.  Bilateral pulmonary embolism diagnosed on 01/05/2024 after presenting with dyspnea and headache.    No provoking factors identified, and leg ultrasound was negative for DVT.    Currently on Eliquis , with a plan for at least six months of anticoagulation due to the unprovoked nature and submassive size of the emboli.   Discussed high recurrence risk without anticoagulation, with a 25% chance of recurrence if stopped after one year. Potential need for lifelong reduced-dose anticoagulation was discussed. Explained risks and benefits of Eliquis , noting its lower bleeding risk compared to Coumadin and lack of dietary interactions.    Given unprovoked pulmonary embolism, we pursued thrombophilia workup on her consultation with us  on 02/06/2024.  Factor V Leiden mutation testing, prothrombin gene mutation testing, beta-2  glycoprotein antibodies, anticardiolipin antibodies, lupus anticoagulant were all unremarkable.  Rest of the hypercoagulable workup will be deferred as it can be falsely abnormal given recent embolism and ongoing Eliquis  use. D-dimer was better at 1.06, compared to 2.35  previously.   Continue Eliquis  5 mg twice  daily   She is up-to-date on age-appropriate cancer screening    REVIEW OF SYSTEMS:    Review of Systems - Oncology  All other pertinent systems were reviewed with the patient and are negative.  I have reviewed the past medical history, past surgical history, social history and family history with the patient and they are unchanged from previous note.  ALLERGIES:  She has no known allergies.  MEDICATIONS:  Current Outpatient Medications  Medication Sig Dispense Refill   albuterol  (VENTOLIN  HFA) 108 (90 Base) MCG/ACT inhaler Inhale 2 puffs into the lungs every 6 (six) hours as needed for wheezing or shortness of breath. 8 g 0   allopurinol  (ZYLOPRIM ) 300 MG tablet Take 1 tablet (300 mg total) by mouth daily. 90 tablet 3   apixaban  (ELIQUIS ) 5 MG TABS tablet Take 1 tablet (5 mg total) by mouth 2 (two) times daily. Start after you finish the starter pack 60 tablet 3   APIXABAN  (ELIQUIS ) VTE STARTER PACK (10MG  AND 5MG ) Take as directed on package: start with two-5mg  tablets twice daily for 7 days. On day 8, switch to one-5mg  tablet twice daily. 74 tablet 0   atorvastatin  (LIPITOR) 40 MG tablet Take 1 tablet (40 mg total) by mouth daily. 90 tablet 3   baclofen  (LIORESAL ) 20 MG tablet Take 1 tablet (20 mg total) by mouth 3 (three) times daily. 90 tablet 5   colchicine  0.6 MG tablet Take 1 tablet (0.6 mg total) by mouth 2 (two) times daily as needed. 12 tablet 5   Misc Natural Products (JOINT HEALTH PO) Take by mouth.     Multiple Vitamin (MULTIVITAMIN) capsule Take 1 capsule by mouth daily.     Omega-3 Fatty Acids (FISH OIL) 500 MG CAPS Take by mouth.     Probiotic Product (PROBIOTIC BLEND PO) Take by mouth.     traMADol  (ULTRAM ) 50 MG tablet 1 tab PO in the day and 2 tabs qhs 90 tablet 5   valACYclovir  (VALTREX ) 1000 MG tablet 2 po at outbreak onset then repeat in 12h for each outbreak 20 tablet 2   No current facility-administered medications for this visit.    PHYSICAL  EXAMINATION:  Not performed today   LABORATORY DATA:   I have reviewed the data as listed.  Recent Results (from the past 2160 hours)  D-Dimer, Quantitative     Status: Abnormal   Collection Time: 01/04/24  1:27 PM  Result Value Ref Range   D-Dimer, Quant 2.35 (H) <0.50 mcg/mL FEU    Comment: Elevated D-dimer levels are associated with DIC, malignancies, inflammation, sepsis, surgery, trauma, and pregnancy. A D-dimer result less than 0.5 mcg/mL  FEU, in conjunction with a non-high clinical pre-test  probability assessment model, excludes deep vein thrombosis and pulmonary embolism. However, since  D-dimer values increase with age, the [employer] recommends an age-adjusted cut-off value in patients older than 50. The calculation for an age adjusted cut-off value is age (years) x 0.01 mcg/mL  FEU. For example, the cut-off for a 67 year old patient would be 70 x 0.01 mcg/mL FEU. . For additional information, please refer to http://education.[employer].com/faq/FAQ149 (This link is being provided for  informational/educational purposes only.)   CBC w/Diff     Status: Abnormal   Collection Time: 01/04/24  1:27 PM  Result Value Ref Range   WBC 8.8 4.0 - 10.5 K/uL   RBC 4.17 3.87 - 5.11 Mil/uL   Hemoglobin 11.8 (L) 12.0 -  15.0 g/dL   HCT 16.1 09.6 - 04.5 %   MCV 88.7 78.0 - 100.0 fl   MCHC 31.9 30.0 - 36.0 g/dL   RDW 40.9 (H) 81.1 - 91.4 %   Platelets 242.0 150.0 - 400.0 K/uL   Neutrophils Relative % 66.3 43.0 - 77.0 %   Lymphocytes Relative 22.4 12.0 - 46.0 %   Monocytes Relative 7.7 3.0 - 12.0 %   Eosinophils Relative 3.1 0.0 - 5.0 %   Basophils Relative 0.5 0.0 - 3.0 %   Neutro Abs 5.8 1.4 - 7.7 K/uL   Lymphs Abs 2.0 0.7 - 4.0 K/uL   Monocytes Absolute 0.7 0.1 - 1.0 K/uL   Eosinophils Absolute 0.3 0.0 - 0.7 K/uL   Basophils Absolute 0.0 0.0 - 0.1 K/uL  Comp Met (CMET)     Status: Abnormal   Collection Time: 01/04/24  1:27 PM  Result Value  Ref Range   Sodium 143 135 - 145 mEq/L   Potassium 4.2 3.5 - 5.1 mEq/L   Chloride 102 96 - 112 mEq/L   CO2 33 (H) 19 - 32 mEq/L   Glucose, Bld 111 (H) 70 - 99 mg/dL   BUN 20 6 - 23 mg/dL   Creatinine, Ser 7.82 0.40 - 1.20 mg/dL   Total Bilirubin 0.8 0.2 - 1.2 mg/dL   Alkaline Phosphatase 82 39 - 117 U/L   AST 15 0 - 37 U/L   ALT 25 0 - 35 U/L   Total Protein 7.3 6.0 - 8.3 g/dL   Albumin 3.7 3.5 - 5.2 g/dL   GFR 95.62 >13.08 mL/min    Comment: Calculated using the CKD-EPI Creatinine Equation (2021)   Calcium  9.4 8.4 - 10.5 mg/dL  Hemoglobin M5H     Status: Abnormal   Collection Time: 01/04/24  1:27 PM  Result Value Ref Range   Hgb A1c MFr Bld 6.9 (H) 4.6 - 6.5 %    Comment: Glycemic Control Guidelines for People with Diabetes:Non Diabetic:  <6%Goal of Therapy: <7%Additional Action Suggested:  >8%   Basic metabolic panel     Status: Abnormal   Collection Time: 01/05/24  2:27 PM  Result Value Ref Range   Sodium 139 135 - 145 mmol/L   Potassium 4.2 3.5 - 5.1 mmol/L   Chloride 103 98 - 111 mmol/L   CO2 24 22 - 32 mmol/L   Glucose, Bld 126 (H) 70 - 99 mg/dL    Comment: Glucose reference range applies only to samples taken after fasting for at least 8 hours.   BUN 25 (H) 8 - 23 mg/dL   Creatinine, Ser 8.46 (H) 0.44 - 1.00 mg/dL   Calcium  9.8 8.9 - 10.3 mg/dL   GFR, Estimated 60 (L) >60 mL/min    Comment: (NOTE) Calculated using the CKD-EPI Creatinine Equation (2021)    Anion gap 12 5 - 15    Comment: Performed at [facility], [address], [place], [place] [address]  CBC     Status: Abnormal   Collection Time: 01/05/24  2:27 PM  Result Value Ref Range   WBC 12.7 (H) 4.0 - 10.5 K/uL   RBC 4.44 3.87 - 5.11 MIL/uL   Hemoglobin 12.4 12.0 - 15.0 g/dL   HCT 28.4 13.2 - 44.0 %   MCV 89.4 80.0 - 100.0 fL   MCH 27.9 26.0 - 34.0 pg   MCHC 31.2 30.0 - 36.0 g/dL   RDW 10.2 (H) 72.5 - 36.6 %   Platelets 280 150 -  400 K/uL   nRBC 0.0 0.0 - 0.2 %    Comment: Performed at [facility], [address], [place], [place] [address]  Brain natriuretic peptide     Status: Abnormal   Collection Time: 01/05/24  5:51 PM  Result Value Ref Range   B Natriuretic Peptide 179.6 (H) 0.0 - 100.0 pg/mL    Comment: Performed at [facility], [address] [place], [place] [address]  Troponin I (High Sensitivity)     Status: None   Collection Time: 01/05/24  5:51 PM  Result Value Ref Range   Troponin I (High Sensitivity) 13 <18 ng/L    Comment: (NOTE) Elevated high sensitivity troponin I (hsTnI) values and significant  changes across serial measurements may suggest ACS but many other  chronic and acute conditions are known to elevate hsTnI results.  Refer to the Links section for chest pain algorithms and additional  guidance. Performed at [facility], [address] [place], [place] [address]   Troponin I (High Sensitivity)     Status: None   Collection Time: 01/05/24  7:26 PM  Result Value Ref Range   Troponin I (High Sensitivity) 13 <18 ng/L    Comment: (NOTE) Elevated high sensitivity troponin I (hsTnI) values and significant  changes across serial measurements may suggest ACS but many other  chronic and acute conditions are known to elevate hsTnI results.  Refer to the Links section for chest pain algorithms and additional  guidance. Performed at [facility], [address] [place], [place] [address]   Heparin  level (unfractionated)     Status: None   Collection Time: 01/06/24  2:58 AM  Result Value Ref Range   Heparin  Unfractionated 0.46 0.30 - 0.70 IU/mL    Comment: (NOTE) The clinical reportable range upper limit is being lowered to >1.10 to align with the FDA approved guidance for the current laboratory assay.  If heparin  results are below expected values, and patient dosage has  been confirmed, suggest follow up testing of antithrombin III levels. Performed at [facility], [address] [place],  [place] [address]   HIV Antibody (routine testing w rflx)     Status: None   Collection Time: 01/06/24  2:58 AM  Result Value Ref Range   HIV Screen 4th Generation wRfx Non Reactive Non Reactive    Comment: Performed at [facility], [address], [place], [place] [address]  Comprehensive metabolic panel     Status: Abnormal   Collection Time: 01/06/24  2:58 AM  Result Value Ref Range   Sodium 140 135 - 145 mmol/L   Potassium 4.5 3.5 - 5.1 mmol/L   Chloride 102 98 - 111 mmol/L   CO2 26 22 - 32 mmol/L   Glucose, Bld 96 70 - 99 mg/dL    Comment: Glucose reference range applies only to samples taken after fasting for at least 8 hours.   BUN 24 (H) 8 - 23 mg/dL   Creatinine, Ser 5.78 0.44 - 1.00 mg/dL   Calcium  9.6 8.9 - 10.3 mg/dL   Total Protein 7.8 6.5 - 8.1 g/dL   Albumin 3.2 (L) 3.5 - 5.0 g/dL   AST 22 15 - 41 U/L   ALT 34 0 - 44 U/L   Alkaline Phosphatase 66 38 - 126 U/L   Total Bilirubin 0.6 0.0 - 1.2 mg/dL   GFR, Estimated >46 >96 mL/min    Comment: (NOTE) Calculated using  the CKD-EPI Creatinine Equation (2021)    Anion gap 12 5 - 15    Comment: Performed at [facility], [address], [place], [place] [address]  CBC     Status: Abnormal   Collection Time: 01/06/24  2:58 AM  Result Value Ref Range   WBC 15.6 (H) 4.0 - 10.5 K/uL   RBC 4.28 3.87 - 5.11 MIL/uL   Hemoglobin 11.9 (L) 12.0 - 15.0 g/dL   HCT 60.4 54.0 - 98.1 %   MCV 89.7 80.0 - 100.0 fL   MCH 27.8 26.0 - 34.0 pg   MCHC 31.0 30.0 - 36.0 g/dL   RDW 19.1 (H) 47.8 - 29.5 %   Platelets 300 150 - 400 K/uL   nRBC 0.0 0.0 - 0.2 %    Comment: Performed at [facility], [address], [place], [place] [address]  CBG monitoring, ED     Status: None   Collection Time: 01/06/24  9:43 AM  Result Value Ref Range   Glucose-Capillary 96 70 - 99 mg/dL    Comment: Glucose reference range applies only to samples taken after fasting for at least 8 hours.   Comment 1 Notify RN    Comment 2 Document in Chart    Heparin  level (unfractionated)     Status: None   Collection Time: 01/06/24  9:59 AM  Result Value Ref Range   Heparin  Unfractionated 0.46 0.30 - 0.70 IU/mL    Comment: (NOTE) The clinical reportable range upper limit is being lowered to >1.10 to align with the FDA approved guidance for the current laboratory assay.  If heparin  results are below expected values, and patient dosage has  been confirmed, suggest follow up testing of antithrombin III levels. Performed at [facility], [address], [place], [place] [address]   Glucose, capillary     Status: Abnormal   Collection Time: 01/06/24 12:44 PM  Result Value Ref Range   Glucose-Capillary 104 (H) 70 - 99 mg/dL    Comment: Glucose reference range applies only to samples taken after fasting for at least 8 hours.  ECHOCARDIOGRAM COMPLETE     Status: None   Collection Time: 01/06/24  3:00 PM  Result Value Ref Range   Weight 6,720 oz   Height 72 in   BP 124/77 mmHg   S' Lateral 2.80 cm   AR max vel 2.46 cm2   AV Area VTI 2.23 cm2   AV Mean grad 7.0 mmHg   AV Peak grad 13.2 mmHg   Ao pk vel 1.82 m/s   AV Area mean vel 2.53 cm2   Area-P 1/2 3.91 cm2   Est EF 55 - 60%   Glucose, capillary     Status: Abnormal   Collection Time: 01/06/24  3:58 PM  Result Value Ref Range   Glucose-Capillary 101 (H) 70 - 99 mg/dL    Comment: Glucose reference range applies only to samples taken after fasting for at least 8 hours.  Glucose, capillary     Status: Abnormal   Collection Time: 01/06/24  9:29 PM  Result Value Ref Range   Glucose-Capillary 128 (H) 70 - 99 mg/dL    Comment: Glucose reference range applies only to samples taken after fasting for at least 8 hours.  CBC     Status: Abnormal   Collection Time: 01/07/24  3:04 AM  Result Value Ref Range   WBC 11.3 (H) 4.0 - 10.5 K/uL   RBC 4.16 3.87 -  5.11 MIL/uL   Hemoglobin 11.4 (L) 12.0 - 15.0 g/dL   HCT 36.6 44.0 - 34.7 %   MCV 88.9 80.0 - 100.0 fL   MCH 27.4 26.0 - 34.0  pg   MCHC 30.8 30.0 - 36.0 g/dL   RDW 42.5 (H) 95.6 - 38.7 %   Platelets 255 150 - 400 K/uL   nRBC 0.0 0.0 - 0.2 %    Comment: Performed at [facility], [address], [place], [place] [address]  Heparin  level (unfractionated)     Status: None   Collection Time: 01/07/24  3:04 AM  Result Value Ref Range   Heparin  Unfractionated 0.38 0.30 - 0.70 IU/mL    Comment: (NOTE) The clinical reportable range upper limit is being lowered to >1.10 to align with the FDA approved guidance for the current laboratory assay.  If heparin  results are below expected values, and patient dosage has  been confirmed, suggest follow up testing of antithrombin III levels. Performed at [facility], [address], [place], [place] [address]   Renal function panel     Status: Abnormal   Collection Time: 01/07/24  3:04 AM  Result Value Ref Range   Sodium 140 135 - 145 mmol/L   Potassium 4.1 3.5 - 5.1 mmol/L   Chloride 102 98 - 111 mmol/L   CO2 27 22 - 32 mmol/L   Glucose, Bld 96 70 - 99 mg/dL    Comment: Glucose reference range applies only to samples taken after fasting for at least 8 hours.   BUN 19 8 - 23 mg/dL   Creatinine, Ser 8.41 0.44 - 1.00 mg/dL   Calcium  9.0 8.9 - 10.3 mg/dL   Phosphorus 4.7 (H) 2.5 - 4.6 mg/dL   Albumin 3.0 (L) 3.5 - 5.0 g/dL   GFR, Estimated >66 >06 mL/min    Comment: (NOTE) Calculated using the CKD-EPI Creatinine Equation (2021)    Anion gap 11 5 - 15    Comment: Performed at [facility], [address] [place], [place] [address]  Magnesium      Status: Abnormal   Collection Time: 01/07/24  3:04 AM  Result Value Ref Range   Magnesium  1.6 (L) 1.7 - 2.4 mg/dL    Comment: Performed at [facility], [address] [place], [place] [address]  Glucose, capillary     Status: Abnormal   Collection Time: 01/07/24  6:16 AM  Result Value Ref Range   Glucose-Capillary 119 (H) 70 - 99 mg/dL    Comment: Glucose reference range applies only to samples  taken after fasting for at least 8 hours.  Comprehensive metabolic panel with GFR     Status: Abnormal   Collection Time: 01/11/24  1:36 PM  Result Value Ref Range   Sodium 138 135 - 145 mEq/L   Potassium 4.3 3.5 - 5.1 mEq/L   Chloride 96 96 - 112 mEq/L   CO2 31 19 - 32 mEq/L   Glucose, Bld 119 (H) 70 - 99 mg/dL   BUN 17 6 - 23 mg/dL   Creatinine, Ser 3.55 0.40 - 1.20 mg/dL   Total Bilirubin 0.8 0.2 - 1.2 mg/dL   Alkaline Phosphatase 80 39 - 117 U/L   AST 17 0 - 37 U/L   ALT 29 0 - 35 U/L   Total Protein 7.7 6.0 - 8.3 g/dL   Albumin 4.1 3.5 - 5.2 g/dL   GFR 73.22 >02.54 mL/min    Comment: Calculated using the  CKD-EPI Creatinine Equation (2021)   Calcium  9.7 8.4 - 10.5 mg/dL  CBC w/Diff     Status: Abnormal   Collection Time: 01/11/24  1:36 PM  Result Value Ref Range   WBC 8.2 4.0 - 10.5 K/uL   RBC 4.52 3.87 - 5.11 Mil/uL   Hemoglobin 12.7 12.0 - 15.0 g/dL   HCT 16.1 09.6 - 04.5 %   MCV 88.7 78.0 - 100.0 fl   MCHC 31.8 30.0 - 36.0 g/dL   RDW 40.9 (H) 81.1 - 91.4 %   Platelets 296.0 150.0 - 400.0 K/uL   Neutrophils Relative % 61.7 43.0 - 77.0 %   Lymphocytes Relative 22.9 12.0 - 46.0 %   Monocytes Relative 11.6 3.0 - 12.0 %   Eosinophils Relative 3.0 0.0 - 5.0 %   Basophils Relative 0.8 0.0 - 3.0 %   Neutro Abs 5.1 1.4 - 7.7 K/uL   Lymphs Abs 1.9 0.7 - 4.0 K/uL   Monocytes Absolute 1.0 0.1 - 1.0 K/uL   Eosinophils Absolute 0.2 0.0 - 0.7 K/uL   Basophils Absolute 0.1 0.0 - 0.1 K/uL  Factor 5 leiden     Status: None   Collection Time: 02/06/24  2:17 PM  Result Value Ref Range   Recommendations-[id number]: Comment     Comment: (NOTE) Result: c.1601G>A (p.Arg534Gln) - Not Detected This result is not associated with an increased risk for venous thromboembolism. See Additional Clinical Information and Comments. Additional Clinical Information:    Venous thromboembolism is a multifactorial disease influenced by genetic, environmental, and circumstantial risk factors.  The c.1601G>A (p. Arg534Gln) variant in the F5 gene, commonly referred to as Factor V Leiden, is a genetic risk factor for venous thromboembolism. Heterozygous carriers of this variant have a 6- to 8- fold increased risk for venous thromboembolism. Individuals homozygous for this variant (ie, with a copy of the variant on each chromosome) have an approximately 80-fold increased risk for venous thromboembolism. Individuals who carry both a c.*97G>A variant in the F2 gene and Factor V Leiden have an approximately 20-fold increased risk for venous thromboembolism. Risks are likely to be even higher in more complex genotype combinations  involving the F2 c.*97G>A variant and Factor V Leiden (PMID: [id number]). Additional risk factors include but are not limited to: deficiency of protein C, protein S, or antithrombin III, age, female sex, personal or family history of deep vein thromboembolism, smoking, surgery, prolonged immobilization, malignant neoplasm, tamoxifen treatment, raloxifene treatment, oral contraceptive use, hormone replacement therapy, and pregnancy. Management of thrombotic risk and thrombotic events should follow established guidelines and fit the clinical circumstance. This result cannot predict the occurrence or recurrence of a thrombotic event. Comment:    Genetic counseling is recommended to discuss the potential clinical implications of positive results, as well as recommendations for testing family members.    Genetic Coordinators are available for health care providers to discuss results at [phone]). Test Details:    Variant Analyzed: c.1601G>A (p. Arg534Gln), referre d to as Factor V Leiden Methods/Limitations:    DNA analysis of the F5 gene (NM_000130.5) was performed by PCR amplification followed by electrophoresis. The diagnostic sensitivity is >99%. Results must be combined with clinical information for the most accurate interpretation.  Molecular-based testing is highly accurate, but as in any laboratory test, diagnostic errors may occur. False positive or false negative results may occur for reasons that include genetic variants, blood transfusions, bone marrow transplantation, somatic or tissue-specific mosaicism, mislabeled samples, or erroneous representation of family relationships.  This test was developed and its performance characteristics determined by [facility]. It has not been cleared or approved by the Food and Drug Administration. References:    Bhatt S, Taylor AK, Lozano R, Grody [facility], Manfred Seed [facility]; ACMG Professional Practice and Guidelines Committee. Addendum: [employer] of [employer]etics consensus statemen t on factor V Leiden mutation testing. Genet Med. 2021 Mar 5. doi: 16.1096/E45409-811- 01108-x. PMID: 91478295.    Nanetta Baars. Factor V Leiden Thrombophilia. 1999 May 14 (Updated 2018 Jan 4). In: Jerri Morale, Ardinger HH, Pagon RA, et al., editors. GeneReviews(R) (Internet). [address] [facility]): [employer] , [place]; 6213-0865. Available from: [email or web address]-mcgee.org/    Lynda Sands, Huang X, Luo B, Spector EB, Nicolasa Barrett CS; A[employer]G Laboratory Quality Assurance Committee. Venous thromboembolism laboratory testing (factor V Leiden and factor II c. *97G>A), 2018 update: a technical standard of the [employer] of [employer] and [employer]omics (ACMG). Genet Med. 2018 HQI;69(62): 9528-4132. doi: 10.1038/[id number]-z. Epub 2018 Oct 5. PMID: 44010272.    Reviewed By: Comment     Comment: (NOTE) Technical Component performed at [employer] [facility] Professional Component performed by: Gwyneth Lesch, PhD, [facility] [id number], [facility], [address] [place] [address] Performed At: [facility] [address] [place], [place] [id number] Adams Adams MDPhD DG:[id number]   Prothrombin gene mutation     Status: None   Collection Time: 02/06/24  2:17 PM  Result  Value Ref Range   Recommendations-PTGENE: Comment     Comment: (NOTE) Result: c.*97G>A - Not Detected This result is not associated with an increased risk for venous thromboembolism. See Additional Clinical Information and Comments. Additional Clinical Information: Venous thromboembolism is a multifactorial disease influenced by genetic, environmental, and circumstantial risk factors. The c.*97G>A variant in the F2 gene is a genetic risk factor for venous thromboembolism. Heterozygous carriers have a 2- to 4-fold increased risk for venous thromboembolism. Homozygotes for the c.*97G>A variant are rare. The annual risk of VTE in homozygotes has been reported to be 1.1%/year. Individuals who carry both a c.*97G>A variant in the F2 gene and a c.1601G>A (p. Arg534Gln) variant in the F5 gene (commonly referred to as Factor V Leiden) have an approximately 20- fold increased risk for venous thromboembolism. Risks are likely to be even higher in more complex genotype combinations involving the F2 c.*97G>A variant and Factor V Leiden (PMID:  [id number]). Additional risk factors include but are not limited to: deficiency of protein C, protein S, or antithrombin III, age, female sex, personal or family history of deep vein thromboembolism, smoking, surgery, prolonged immobilization, malignant neoplasm, tamoxifen treatment, raloxifene treatment, oral contraceptive use, hormone replacement therapy, and pregnancy. Management of thrombotic risk and thrombotic events should follow established guidelines and fit the clinical circumstance. This result cannot predict the occurrence or recurrence of a thrombotic event. Comments: Genetic counseling is recommended to discuss the potential clinical implications of positive results, as well as recommendations for testing family members. Genetic Coordinators are available for health care providers to discuss results at [phone]). Test  Details: Variant analyzed: c.*97G>A, previously referred to as G20210A Methods/Limitations: DNA analysis of the F2 gene (NM_000 506.5) was performed by PCR amplification followed by restriction enzyme analysis. The diagnostic sensitivity is >99%. Results must be combined with clinical information for the most accurate interpretation. Molecular-based testing is highly accurate, but as in any laboratory test, diagnostic errors may occur. False positive or false negative results may occur for reasons that include genetic variants, blood transfusions, bone marrow transplantation, somatic or tissue-specific  mosaicism, mislabeled samples, or erroneous representation of family relationships. This test was developed and its performance characteristics determined by [facility]. It has not been cleared or approved by the Food and Drug Administration. References: Bhatt S, Taylor AK, Lozano R, Grody [facility], Manfred Seed [facility]; ACMG Professional Practice and Guidelines Committee. Addendum: [employer] of [employer] consensus statement on factor V Leiden mutation testing. Genet Med. 2021 Mar 5. doi: 40.9811/B147 36-021-01108-x. PMID: 82956213. Nanetta Baars. Prothrombin Thrombophilia. 2006 Jul 25 [Updated 2021 Feb 4]. In: Jerri Morale, Ardinger HH, Pagon RA, et al., editors. GeneReviews(R) [Internet]. [address] [facility]): [employer] [place] , [place]; 0865-7846. Available from: [email or web address]/ Lynda Sands, Huang X, Luo B, Spector EB, Nicolasa Barrett CS; ACMG Laboratory Quality Assurance Committee. Venous thromboembolism laboratory testing (factor V Leiden and factor II c.*97G>A), 2018 update: a technical standard of the [employer] (ACMG). Genet Med. 2018 Dec;20(12):1489-1498. doi: 10.1038/[id number]-z. Epub 2018 Oct 5. PMID: 96295284.    Reviewed by: Comment     Comment: (NOTE) Melodie Spry, PhD [facility] Performed At: [facility]  RTP [address] [place], [place] [id number] Adams Adams MDPhD VO:[id number]   Lupus anticoagulant panel     Status: Abnormal   Collection Time: 02/06/24  2:17 PM  Result Value Ref Range   PTT Lupus Anticoagulant 39.9 0.0 - 43.5 sec   DRVVT 89.7 (H) 0.0 - 47.0 sec   Lupus Anticoag Interp Comment:     Comment: (NOTE) No lupus anticoagulant was detected. These results are consistent with specific inhibitors to one or more common pathway factors (X, V, II or fibrinogen). Performed At: [facility] [address] [place], [place] [id number] Pearlean Botts MD FI:[id number]   Cardiolipin antibodies, IgG, IgM, IgA     Status: None   Collection Time: 02/06/24  2:17 PM  Result Value Ref Range   Anticardiolipin IgG <9 0 - 14 GPL U/mL    Comment: (NOTE)                          Negative:              <15                          Indeterminate:     15 - 20                          Low-Med Positive: >20 - 80                          High Positive:         >80    Anticardiolipin IgM <9 0 - 12 MPL U/mL    Comment: (NOTE)                          Negative:              <13                          Indeterminate:     13 - 20                          Low-Med Positive: >20 - 80  High Positive:         >80    Anticardiolipin IgA <9 0 - 11 APL U/mL    Comment: (NOTE)                          Negative:              <12                          Indeterminate:     12 - 20                          Low-Med Positive: >20 - 80                          High Positive:         >80 Performed At: [facility] [place] [address] [place], [place] [id number] Pearlean Botts MD HY:[id number]   Beta-2 -glycoprotein i abs, IgG/M/A     Status: None   Collection Time: 02/06/24  2:17 PM  Result Value Ref Range   Beta-2  Glyco I IgG <9 0 - 20 GPI IgG units    Comment: (NOTE) The reference interval reflects a 3SD or 99th percentile interval, which is thought to represent a  potentially clinically significant result in accordance with the International Consensus Statement on the classification criteria for definitive antiphospholipid syndrome (APS). J Thromb Haem 2006;4:295-306.    Beta-2 -Glycoprotein I IgM <9 0 - 32 GPI IgM units    Comment: (NOTE) The reference interval reflects a 3SD or 99th percentile interval, which is thought to represent a potentially clinically significant result in accordance with the International Consensus Statement on the classification criteria for definitive antiphospholipid syndrome (APS). J Thromb Haem 2006;4:295-306. Performed At: [facility] [address] [place], [place] [id number] Pearlean Botts MD [phone]:[id number]    Beta-2 -Glycoprotein I IgA <9 0 - 25 GPI IgA units    Comment: (NOTE) The reference interval reflects a 3SD or 99th percentile interval, which is thought to represent a potentially clinically significant result in accordance with the International Consensus Statement on the classification criteria for definitive antiphospholipid syndrome (APS). J Thromb Haem 2006;4:295-306.   D-dimer, quantitative     Status: Abnormal   Collection Time: 02/06/24  2:17 PM  Result Value Ref Range   D-Dimer, Quant 1.06 (H) 0.00 - 0.50 ug/mL-FEU    Comment: (NOTE) At the manufacturer cut-off value of 0.5 g/mL FEU, this assay has a negative predictive value of 95-100%.This assay is intended for use in conjunction with a clinical pretest probability (PTP) assessment model to exclude pulmonary embolism (PE) and deep venous thrombosis (DVT) in outpatients suspected of PE or DVT. Results should be correlated with clinical presentation. Performed at [facility], [address] [place], [place] [address]   CMP (Cancer Center only)     Status: Abnormal   Collection Time: 02/06/24  2:17 PM  Result Value Ref Range   Sodium 141 135 - 145 mmol/L   Potassium 4.1 3.5 - 5.1 mmol/L   Chloride 101 98 -  111 mmol/L   CO2 26 22 - 32 mmol/L   Glucose, Bld 128 (H) 70 - 99 mg/dL    Comment: Glucose reference range applies only to samples taken after fasting for at least 8 hours.   BUN 17 8 - 23 mg/dL  Creatinine 0.93 0.44 - 1.00 mg/dL   Calcium  10.4 (H) 8.9 - 10.3 mg/dL   Total Protein 8.4 (H) 6.5 - 8.1 g/dL   Albumin 3.7 3.5 - 5.0 g/dL   AST 21 15 - 41 U/L   ALT 19 0 - 44 U/L   Alkaline Phosphatase 83 38 - 126 U/L   Total Bilirubin 0.5 0.0 - 1.2 mg/dL   GFR, Estimated >16 >10 mL/min    Comment: (NOTE) Calculated using the CKD-EPI Creatinine Equation (2021)    Anion gap 14 5 - 15    Comment: Performed at [facility], [address], [place], [place] [address]  CBC with Differential (Cancer Center Only)     Status: Abnormal   Collection Time: 02/06/24  2:17 PM  Result Value Ref Range   WBC Count 8.2 4.0 - 10.5 K/uL   RBC 4.15 3.87 - 5.11 MIL/uL   Hemoglobin 11.4 (L) 12.0 - 15.0 g/dL   HCT 40.9 81.1 - 91.4 %   MCV 88.2 80.0 - 100.0 fL   MCH 27.5 26.0 - 34.0 pg   MCHC 31.1 30.0 - 36.0 g/dL   RDW 78.2 (H) 95.6 - 21.3 %   Platelet Count 304 150 - 400 K/uL   nRBC 0.0 0.0 - 0.2 %   Neutrophils Relative % 67 %   Neutro Abs 5.5 1.7 - 7.7 K/uL   Lymphocytes Relative 22 %   Lymphs Abs 1.8 0.7 - 4.0 K/uL   Monocytes Relative 7 %   Monocytes Absolute 0.5 0.1 - 1.0 K/uL   Eosinophils Relative 3 %   Eosinophils Absolute 0.3 0.0 - 0.5 K/uL   Basophils Relative 0 %   Basophils Absolute 0.0 0.0 - 0.1 K/uL   Immature Granulocytes 1 %   Abs Immature Granulocytes 0.04 0.00 - 0.07 K/uL    Comment: Performed at [facility], [address] [place], [place] [address]  dRVVT Mix     Status: Abnormal   Collection Time: 02/06/24  2:17 PM  Result Value Ref Range   dRVVT Mix 60.5 (H) 0.0 - 40.4 sec    Comment: (NOTE) Performed At: [facility] [address] [place], [place] [id number] Pearlean Botts MD WU:[id number]   dRVVT Confirm     Status:  None   Collection Time: 02/06/24  2:17 PM  Result Value Ref Range   dRVVT Confirm 1.1 0.8 - 1.2 ratio    Comment: (NOTE) Performed At: [facility] [address] [place], [place] [id number] Pearlean Botts MD KV:[id number]      RADIOGRAPHIC STUDIES:  No recent pertinent imaging studies available to review.  Orders Placed This Encounter  Procedures   CBC with Differential (Cancer Center Only)    Standing Status:   Future    Expiration Date:   02/20/2025   D-dimer, quantitative    Standing Status:   Future    Expiration Date:   02/20/2025     Future Appointments  Date Time Provider Department Center  03/01/2024  9:00 AM [facility]G MM 3 [facility]-BCGMM GI-BREAST CE  05/09/2024  1:40 PM Sheryle Donning, MD [facility]  05/15/2024  2:30 PM [facility]-MEDONC PHLEBOTOMIST [facility] None  05/15/2024  2:45 PM Ingra Rother, Gale Jude, MD [facility] None  06/20/2024  1:20 PM [name], [name], DO [facility]    This document was completed utilizing speech recognition software. Grammatical errors, random word insertions, pronoun errors, and incomplete sentences are an occasional consequence of this system due to software limitations, ambient noise, and hardware  issues. Any formal questions or concerns about the content, text or information contained within the body of this dictation should be directly addressed to the provider for clarification.

## 2024-02-21 ENCOUNTER — Encounter: Payer: Self-pay | Admitting: Oncology

## 2024-02-21 NOTE — Assessment & Plan Note (Signed)
 Bilateral pulmonary embolism diagnosed on 01/05/2024 after presenting with dyspnea and headache.   No provoking factors identified, and leg ultrasound was negative for DVT.   Currently on Eliquis , with a plan for at least six months of anticoagulation due to the unprovoked nature and submassive size of the emboli.   Discussed high recurrence risk without anticoagulation, with a 25% chance of recurrence if stopped after one year. Potential need for lifelong reduced-dose anticoagulation was discussed. Explained risks and benefits of Eliquis , noting its lower bleeding risk compared to Coumadin and lack of dietary interactions.   Given unprovoked pulmonary embolism, we pursued thrombophilia workup on her consultation with us  on 02/06/2024.  Factor V Leiden mutation testing, prothrombin gene mutation testing, beta-2  glycoprotein antibodies, anticardiolipin antibodies, lupus anticoagulant were all unremarkable.  Rest of the hypercoagulable workup will be deferred as it can be falsely abnormal given recent embolism and ongoing Eliquis  use. D-dimer was better at 1.06, compared to 2.35 previously.   Continue Eliquis  5 mg twice daily   She is up-to-date on age-appropriate cancer screening.  Advise on precautions during travel, including staying hydrated, performing ankle exercises, and walking every couple of hours on long flights.

## 2024-03-01 ENCOUNTER — Ambulatory Visit
Admission: RE | Admit: 2024-03-01 | Discharge: 2024-03-01 | Disposition: A | Discharge status: Home / Self Care | Payer: Self-pay | Source: Ambulatory Visit | Attending: Family Medicine | Admitting: Family Medicine

## 2024-03-01 DIAGNOSIS — Z1231 Encounter for screening mammogram for malignant neoplasm of breast: Secondary | ICD-10-CM

## 2024-03-05 ENCOUNTER — Ambulatory Visit: Payer: Self-pay | Admitting: Family Medicine

## 2024-03-13 LAB — HM DIABETES EYE EXAM

## 2024-05-09 ENCOUNTER — Encounter (HOSPITAL_BASED_OUTPATIENT_CLINIC_OR_DEPARTMENT_OTHER): Payer: Self-pay | Admitting: Cardiology

## 2024-05-09 ENCOUNTER — Ambulatory Visit (HOSPITAL_BASED_OUTPATIENT_CLINIC_OR_DEPARTMENT_OTHER): Payer: Self-pay | Admitting: Cardiology

## 2024-05-09 VITALS — BP 124/70 | HR 99 | Resp 17 | Ht 72.0 in | Wt 374.0 lb

## 2024-05-09 DIAGNOSIS — I517 Cardiomegaly: Secondary | ICD-10-CM

## 2024-05-09 DIAGNOSIS — Z712 Person consulting for explanation of examination or test findings: Secondary | ICD-10-CM

## 2024-05-09 DIAGNOSIS — Z86711 Personal history of pulmonary embolism: Secondary | ICD-10-CM | POA: Diagnosis not present

## 2024-05-09 NOTE — Patient Instructions (Addendum)
 Medication Instructions:  Your physician recommends that you continue on your current medications as directed. Please refer to the Current Medication list given to you today.   Labwork: NONE  Testing/Procedures: Your physician has requested that you have an echocardiogram. Echocardiography is a painless test that uses sound waves to create images of your heart. It provides your doctor with information about the size and shape of your heart and how well your heart's chambers and valves are working. This procedure takes approximately one hour. There are no restrictions for this procedure. Please do NOT wear cologne, perfume, aftershave, or lotions (deodorant is allowed). Please arrive 15 minutes prior to your appointment time.  Please note: We ask at that you not bring children with you during ultrasound (echo/ vascular) testing. Due to room size and safety concerns, children are not allowed in the ultrasound rooms during exams. Our front office staff cannot provide observation of children in our lobby area while testing is being conducted. An adult accompanying a patient to their appointment will only be allowed in the ultrasound room at the discretion of the ultrasound technician under special circumstances. We apologize for any inconvenience.   Follow-Up: AS NEEDED IF ECHO LOOKS OK

## 2024-05-09 NOTE — Progress Notes (Signed)
 Cardiology Office Note:  .   Date:  05/09/2024  ID:  Nathanel DELENA Gold, DOB 02/26/1956, MRN [id number] PCP: Catherine Charlies DELENA, DO  [facility] Providers Cardiologist:  Shelda Bruckner, MD {  History of Present Illness: Mckenzie [name] is a 68 y.o. female with PMH recent unprovoked pulmonary embolism with RV changes, morbid obesity, hyperlipidemia  Today: Referral from 01/12/24 from Dr. Catherine reviewed. Noted to have acute PE with abnormal RV size/function, LVH. Referred to cardiology for further evaluation.  She is feeling very well overall. Her breathing is drastically improved. Blood pressure at home has been well controlled on no meds. Biggest limitation is pain, starting with knees.  Off all meds except atorvastatin , apixaban , and allopurinol . Blood pressure has been well controlled.   Has lost 40 lbs on her own with lifestyle changes, mostly diet changes. Uses arm weights, walks around house for activity.  Has seen hematology for hypercoagulable state. Workup has been unremarkable for mutation.   FH: mother died of heart failure age 70  ROS: Denies chest pain, shortness of breath at rest or with normal exertion. No PND, orthopnea, LE edema or unexpected weight gain. No syncope or palpitations. ROS otherwise negative except as noted.   Studies Reviewed: SABRA    EKG:  EKG Interpretation Date/Time:  Wednesday May 09 2024 13:53:52 EDT Ventricular Rate:  87 PR Interval:  164 QRS Duration:  88 QT Interval:  328 QTC Calculation: 394 R Axis:   15  Text Interpretation: Normal sinus rhythm with sinus arrhythmia Nonspecific ST and T wave abnormality Confirmed by Bruckner Shelda [phone]) on 05/09/2024 2:13:32 PM    Physical Exam:   VS:  BP 124/70 (BP Location: Left Arm, Patient Position: Sitting, Cuff Size: Large)   Pulse 99   Resp 17   Ht 6' (1.829 m)   Wt (!) 374 lb (169.6 kg)   SpO2 98%   BMI 50.72 kg/m    Wt Readings from Last 3 Encounters:  05/09/24  (!) 374 lb (169.6 kg)  02/06/24 (!) 387 lb 9.6 oz (175.8 kg)  01/11/24 (!) 398 lb (180.5 kg)    GEN: Well nourished, well developed in no acute distress HEENT: Normal, moist mucous membranes NECK: No JVD CARDIAC: regular rhythm, normal S1 and S2, no rubs or gallops. No murmur. VASCULAR: Radial and DP pulses 2+ bilaterally. No carotid bruits RESPIRATORY:  Clear to auscultation without rales, wheezing or rhonchi  ABDOMEN: Soft, non-tender, non-distended MUSCULOSKELETAL:  Ambulates independently SKIN: Warm and dry, no edema NEUROLOGIC:  Alert and oriented x 3. No focal neuro deficits noted. PSYCHIATRIC:  Normal affect    ASSESSMENT AND PLAN: .    History of acute pulmonary embolism with RV strain -reviewed her echo together, RV dilation/function with acute PE -will repeat, expect her RV to be normalized  Mild LVH -not concerning, watch blood pressure, well controlled today  Morbid obesity -losing weight, congratulated  CV risk counseling and prevention -recommend heart healthy/Mediterranean diet, with whole grains, fruits, vegetable, fish, lean meats, nuts, and olive oil. Limit salt. -recommend moderate walking, 3-5 times/week for 30-50 minutes each session. Aim for at least 150 minutes/week. Goal should be pace of 3 miles/hours, or walking 1.5 miles in 30 minutes -recommend avoidance of tobacco products. Avoid excess alcohol. -she is on atorvastatin  for primary prevention  Dispo: If RV normalized, I would be happy to see her back as needed  Signed, Shelda Bruckner, MD   Shelda Bruckner, MD, PhD, [facility]  [facility]    Heart & Vascular at [facility] [address], [address] [place], [place] [address] [phone]

## 2024-05-15 ENCOUNTER — Inpatient Hospital Stay: Attending: Oncology | Admitting: Oncology

## 2024-05-15 ENCOUNTER — Other Ambulatory Visit (HOSPITAL_BASED_OUTPATIENT_CLINIC_OR_DEPARTMENT_OTHER): Payer: Self-pay | Admitting: Cardiology

## 2024-05-15 ENCOUNTER — Inpatient Hospital Stay

## 2024-05-15 VITALS — BP 130/67 | HR 87 | Temp 98.4°F | Resp 18

## 2024-05-15 DIAGNOSIS — I2609 Other pulmonary embolism with acute cor pulmonale: Secondary | ICD-10-CM

## 2024-05-15 DIAGNOSIS — Z86711 Personal history of pulmonary embolism: Secondary | ICD-10-CM

## 2024-05-15 DIAGNOSIS — D649 Anemia, unspecified: Secondary | ICD-10-CM | POA: Diagnosis not present

## 2024-05-15 DIAGNOSIS — I2699 Other pulmonary embolism without acute cor pulmonale: Secondary | ICD-10-CM | POA: Diagnosis present

## 2024-05-15 DIAGNOSIS — Z7901 Long term (current) use of anticoagulants: Secondary | ICD-10-CM | POA: Diagnosis not present

## 2024-05-15 DIAGNOSIS — I517 Cardiomegaly: Secondary | ICD-10-CM

## 2024-05-15 DIAGNOSIS — Z712 Person consulting for explanation of examination or test findings: Secondary | ICD-10-CM

## 2024-05-15 LAB — CBC WITH DIFFERENTIAL (CANCER CENTER ONLY)
Abs Immature Granulocytes: 0.02 K/uL (ref 0.00–0.07)
Basophils Absolute: 0.1 K/uL (ref 0.0–0.1)
Basophils Relative: 1 %
Eosinophils Absolute: 0.2 K/uL (ref 0.0–0.5)
Eosinophils Relative: 3 %
HCT: 32.3 % — ABNORMAL LOW (ref 36.0–46.0)
Hemoglobin: 9.7 g/dL — ABNORMAL LOW (ref 12.0–15.0)
Immature Granulocytes: 0 %
Lymphocytes Relative: 28 %
Lymphs Abs: 2 K/uL (ref 0.7–4.0)
MCH: 25.1 pg — ABNORMAL LOW (ref 26.0–34.0)
MCHC: 30 g/dL (ref 30.0–36.0)
MCV: 83.5 fL (ref 80.0–100.0)
Monocytes Absolute: 0.5 K/uL (ref 0.1–1.0)
Monocytes Relative: 7 %
Neutro Abs: 4.5 K/uL (ref 1.7–7.7)
Neutrophils Relative %: 61 %
Platelet Count: 310 K/uL (ref 150–400)
RBC: 3.87 MIL/uL (ref 3.87–5.11)
RDW: 16.9 % — ABNORMAL HIGH (ref 11.5–15.5)
WBC Count: 7.3 K/uL (ref 4.0–10.5)
nRBC: 0 % (ref 0.0–0.2)

## 2024-05-15 LAB — D-DIMER, QUANTITATIVE: D-Dimer, Quant: 0.6 ug{FEU}/mL — ABNORMAL HIGH (ref 0.00–0.50)

## 2024-05-15 MED ORDER — ALPRAZOLAM 1 MG PO TABS
0.5000 mg | ORAL_TABLET | Freq: Once | ORAL | 0 refills | Status: DC | PRN
Start: 2024-05-15 — End: 2024-06-20

## 2024-05-15 NOTE — Assessment & Plan Note (Signed)
 Bilateral pulmonary embolism diagnosed on 01/05/2024 after presenting with dyspnea and headache.   No provoking factors identified, and leg ultrasound was negative for DVT.   Currently on Eliquis , with a plan for at least six months of anticoagulation due to the unprovoked nature and submassive size of the emboli.  Discussed high recurrence risk without anticoagulation, with a 25% chance of recurrence if stopped after one year. Potential need for lifelong reduced-dose anticoagulation was discussed. Explained risks and benefits of Eliquis , noting its lower bleeding risk compared to Coumadin and lack of dietary interactions.   Given unprovoked pulmonary embolism, we pursued thrombophilia workup on her consultation with us  on 02/06/2024.  Factor V Leiden mutation testing, prothrombin gene mutation testing, beta-2  glycoprotein antibodies, anticardiolipin antibodies, lupus anticoagulant were all unremarkable.  Rest of the hypercoagulable workup will be deferred as it can be falsely abnormal given recent embolism and ongoing Eliquis  use. D-dimer was better at 1.06, compared to 2.35 previously.  Labs today showed improved D-dimer of 0.6.   Continue Eliquis  5 mg twice daily.  Will obtain CT angiogram of the chest in the first week of November, 6 months from the time of her diagnosis to follow-up on pulmonary embolism status.  Plan to see her in clinic to discuss next steps in management depending on results.   She is up-to-date on age-appropriate cancer screening.  Advise on precautions during travel, including staying hydrated, performing ankle exercises, and walking every couple of hours on long flights.

## 2024-05-15 NOTE — Progress Notes (Unsigned)
 [facility]  HEMATOLOGY CLINIC PROGRESS NOTE  PATIENT NAME: Mckenzie [name]   MR#: [id number] DOB: [birth year]  Patient Care Team: Catherine Charlies DELENA, DO as PCP - General (Family Medicine) Lonni Slain, MD as PCP - Cardiology (Cardiology) Levora Dickey BRAVO, MD as Consulting Physician (Obstetrics and Gynecology) Kristie Lamprey, MD as Consulting Physician (Gastroenterology) Vernona Slough, [place] (Ophthalmology)  Date of visit: 05/15/2024   ASSESSMENT & PLAN:   Mckenzie [name] is a 68 y.o. lady with a past medical history of diabetes mellitus, hypertension, polyarthralgia, myalgia, was referred to our service for evaluation of possible hypercoagulable state, given acute bilateral pulmonary embolism diagnosed in May 2025.    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   No provoking factors identified, and leg ultrasound was negative for DVT.   Currently on Eliquis , with a plan for at least six months of anticoagulation due to the unprovoked nature and submassive size of the emboli.  Discussed high recurrence risk without anticoagulation, with a 25% chance of recurrence if stopped after one year. Potential need for lifelong reduced-dose anticoagulation was discussed. Explained risks and benefits of Eliquis , noting its lower bleeding risk compared to Coumadin and lack of dietary interactions.   Given unprovoked pulmonary embolism, we pursued thrombophilia workup on her consultation with us  on 02/06/2024.  Factor V Leiden mutation testing, prothrombin gene mutation testing, beta-2  glycoprotein antibodies, anticardiolipin antibodies, lupus anticoagulant were all unremarkable.  Rest of the hypercoagulable workup will be deferred as it can be falsely abnormal given recent embolism and ongoing Eliquis  use. D-dimer was better at 1.06, compared to 2.35 previously.  Labs  today showed improved D-dimer of 0.6.   Continue Eliquis  5 mg twice daily.  Will obtain CT angiogram of the chest in the first week of November, 6 months from the time of her diagnosis to follow-up on pulmonary embolism status.  Plan to see her in clinic to discuss next steps in management depending on results.   She is up-to-date on age-appropriate cancer screening.  Advise on precautions during travel, including staying hydrated, performing ankle exercises, and walking every couple of hours on long flights.  Normocytic anemia Anemia with hemoglobin decreased from 11.4 to 9.7. Possible causes include blood thinner use and low iron intake due to dietary changes. No significant bleeding reported, though there is occasional breakthrough uterine bleeding. Previous iron supplementation caused constipation. Low iron may contribute to muscle weakness and lack of energy. - Recommend over-the-counter liquid iron supplementation - Plan to check iron levels at next visit - Evaluate for other conditions that may increase risk of blood clots at next visit   I spent a total of 30 minutes during this encounter with the patient including review of chart and various tests results, discussions about plan of care and coordination of care plan.  I reviewed lab results and outside records for this visit and discussed relevant results with the patient. Diagnosis, plan of care and treatment options were also discussed in detail with the patient. Opportunity provided to ask questions and answers provided to her apparent satisfaction. Provided instructions to call our clinic with any problems, questions or concerns prior to return visit. I recommended to continue follow-up with PCP and sub-specialists. She verbalized understanding and agreed with the plan. No barriers to learning was detected.  Chinita Patten, MD  05/15/2024 3:21 PM  [facility] [id number] [place] [address] Dept: [phone] Dept Fax: [phone]   CHIEF COMPLAINT/ REASON FOR VISIT:  Follow-up for history of bilateral pulmonary emboli in May 2025.  INTERVAL HISTORY:  Discussed the use of AI scribe software for clinical note transcription with the patient, who gave verbal consent to proceed.  History of Present Illness Mckenzie [name] is a 68 year old female with a history of blood clots who presents for follow-up on blood thinner therapy.  She is currently on Eliquis , taken twice daily, with no issues of bleeding or bruising. Her hemoglobin has decreased from 11.4 to 9.7 since the last visit. She experiences occasional breakthrough uterine bleeding lasting about two days, with the last episode occurring a month ago. She has a history of low iron and has previously taken iron supplements but is concerned about constipation associated with them. She has tried liquid iron supplements in the past.  There is a noted decrease in protein intake, particularly beef and chicken. She is actively working on weight loss by adjusting her diet. She expresses a dislike for closed spaces, indicating claustrophobia, which affects her ability to undergo certain diagnostic procedures like MRI.  She is experiencing numbness in her baby fingers, which she attributes to sleeping on that side. No new shortness of breath or chest pain is reported, and she feels well in those areas. Her D-dimer levels have been decreasing, with the last recorded level at 1.06, down from 2.35.    SUMMARY OF HEMATOLOGIC HISTORY:  She experienced blood clots in both lungs, discovered after seeking medical attention on 01/05/2024 for persistent shortness of breath and a headache following a short flight of about 45 minutes. She had been feeling unwell for some time, attributing her symptoms to obesity, but realized the severity of her condition after the flight. A D-dimer test  revealed extremely high levels, leading to a two-day hospital stay where she was started on Eliquis .   She has been taking Eliquis , one tablet twice a day, and today marks the last of her 30-day pack. She is in the process of securing a refill, with assistance from her social worker, Molly Marten, to find the most affordable option due to recent changes in her Medicare and Medicaid status.   She has no history of surgeries or family history of similar conditions. Her leg ultrasound was negative for clots. No history of chest pain or trouble breathing currently. She is up to date on mammograms and colonoscopies, with the next mammogram scheduled for the end of the month. Her blood pressure is well-controlled, and she is no longer on blood pressure medication.   She travels frequently by air and had been experiencing shortness of breath for some time before the recent incident. She drinks a couple of quarts of water daily and takes magnesium  supplements but no new medications or over-the-counter supplements recently. No history of palpitations or fast heartbeat at home.   Negative Thrombotic Risk Factors: Previous VTE, Recent surgery (within 3 months), Recent trauma (within 3 months), Recent admission to hospital with acute illness (within 3 months), Paralysis, paresis, or recent plaster cast immobilization of lower extremity, Central venous catheterization, Bed rest >72 hours within 3 months, Sedentary journey lasting >8 hours within 4 weeks, Pregnancy, Recent cesarean section (within 3 months), Estrogen therapy, Testosterone therapy, Erythropoiesis-stimulating agent, Recent COVID diagnosis (within 3 months), Active cancer, Non-malignant, chronic inflammatory condition, Known thrombophilic condition, Smoking.   Bilateral pulmonary embolism  diagnosed on 01/05/2024 after presenting with dyspnea and headache.    No provoking factors identified, and leg ultrasound was negative for DVT.    Currently on  Eliquis , with a plan for at least six months of anticoagulation due to the unprovoked nature and submassive size of the emboli.   Discussed high recurrence risk without anticoagulation, with a 25% chance of recurrence if stopped after one year. Potential need for lifelong reduced-dose anticoagulation was discussed. Explained risks and benefits of Eliquis , noting its lower bleeding risk compared to Coumadin and lack of dietary interactions.    Given unprovoked pulmonary embolism, we pursued thrombophilia workup on her consultation with us  on 02/06/2024.  Factor V Leiden mutation testing, prothrombin gene mutation testing, beta-2  glycoprotein antibodies, anticardiolipin antibodies, lupus anticoagulant were all unremarkable.  Rest of the hypercoagulable workup will be deferred as it can be falsely abnormal given recent embolism and ongoing Eliquis  use. D-dimer was better at 1.06, compared to 2.35 previously.   Continue Eliquis  5 mg twice daily.   She is up-to-date on age-appropriate cancer screening.  I have reviewed the past medical history, past surgical history, social history and family history with the patient and they are unchanged from previous note.  ALLERGIES: She has no known allergies.  MEDICATIONS:  Current Outpatient Medications  Medication Sig Dispense Refill   allopurinol  (ZYLOPRIM ) 300 MG tablet Take 1 tablet (300 mg total) by mouth daily. 90 tablet 3   ALPRAZolam  (XANAX ) 1 MG tablet Take 0.5 tablets (0.5 mg total) by mouth once as needed for up to 1 dose for anxiety. May repeat one more dose before the scan as needed 2 tablet 0   apixaban  (ELIQUIS ) 5 MG TABS tablet Take 1 tablet (5 mg total) by mouth 2 (two) times daily. Start after you finish the starter pack 60 tablet 3   atorvastatin  (LIPITOR) 40 MG tablet Take 1 tablet (40 mg total) by mouth daily. 90 tablet 3   colchicine  0.6 MG tablet Take 1 tablet (0.6 mg total) by mouth 2 (two) times daily as needed. 12 tablet 5   Misc  Natural Products (JOINT HEALTH PO) Take by mouth.     Multiple Vitamin (MULTIVITAMIN) capsule Take 1 capsule by mouth daily.     Probiotic Product (PROBIOTIC BLEND PO) Take by mouth.     valACYclovir  (VALTREX ) 1000 MG tablet 2 po at outbreak onset then repeat in 12h for each outbreak 20 tablet 2   Omega-3 Fatty Acids (FISH OIL) 500 MG CAPS Take by mouth. (Patient not taking: Reported on 05/15/2024)     No current facility-administered medications for this visit.     REVIEW OF SYSTEMS:    Review of Systems - Oncology  All other pertinent systems were reviewed with the patient and are negative.  PHYSICAL EXAMINATION:    Onc Performance Status - 05/15/24 1448       ECOG Perf Status   ECOG Perf Status Restricted in physically strenuous activity but ambulatory and able to carry out work of a light or sedentary nature, e.g., light house work, office work      KPS SCALE   KPS % SCORE Able to carry on normal activity, minor s/s of disease          Vitals:   05/15/24 1440 05/15/24 1441  BP: (!) 148/66 130/67  Pulse: 87   Resp: 18   Temp: 98.4 F (36.9 C)   SpO2: 100%    There were no vitals filed for this visit.  Physical Exam Constitutional:      General: She is not in acute distress.    Appearance: Normal appearance.  HENT:     Head: Normocephalic and atraumatic.  Cardiovascular:     Rate and Rhythm: Normal rate.  Pulmonary:     Effort: Pulmonary effort is normal. No respiratory distress.  Abdominal:     General: There is no distension.  Neurological:     General: No focal deficit present.     Mental Status: She is alert and oriented to person, place, and time.  Psychiatric:        Mood and Affect: Mood normal.        Behavior: Behavior normal.     LABORATORY DATA:   I have reviewed the data as listed.  Results for orders placed or performed in visit on 05/15/24  D-dimer, quantitative  Result Value Ref Range   D-Dimer, Quant 0.60 (H) 0.00 - 0.50 ug/mL-FEU   CBC with Differential (Cancer Center Only)  Result Value Ref Range   WBC Count 7.3 4.0 - 10.5 K/uL   RBC 3.87 3.87 - 5.11 MIL/uL   Hemoglobin 9.7 (L) 12.0 - 15.0 g/dL   HCT 67.6 (L) 63.9 - 53.9 %   MCV 83.5 80.0 - 100.0 fL   MCH 25.1 (L) 26.0 - 34.0 pg   MCHC 30.0 30.0 - 36.0 g/dL   RDW 83.0 (H) 88.4 - 84.4 %   Platelet Count 310 150 - 400 K/uL   nRBC 0.0 0.0 - 0.2 %   Neutrophils Relative % 61 %   Neutro Abs 4.5 1.7 - 7.7 K/uL   Lymphocytes Relative 28 %   Lymphs Abs 2.0 0.7 - 4.0 K/uL   Monocytes Relative 7 %   Monocytes Absolute 0.5 0.1 - 1.0 K/uL   Eosinophils Relative 3 %   Eosinophils Absolute 0.2 0.0 - 0.5 K/uL   Basophils Relative 1 %   Basophils Absolute 0.1 0.0 - 0.1 K/uL   Immature Granulocytes 0 %   Abs Immature Granulocytes 0.02 0.00 - 0.07 K/uL    RADIOGRAPHIC STUDIES:  No recent pertinent imaging studies available to review.  Orders Placed This Encounter  Procedures   CT Angio Chest Pulmonary Embolism (PE) W or WO Contrast    Standing Status:   Future    Expected Date:   07/09/2024    Expiration Date:   05/15/2025    If indicated for the ordered procedure, I authorize the administration of contrast media per Radiology protocol:   Yes    Does the patient have a contrast media/X-ray dye allergy?:   No    Preferred imaging location?:   [facility]   CBC with Differential (Cancer Center Only)    Standing Status:   Future    Expiration Date:   05/15/2025   D-dimer, quantitative    Standing Status:   Future    Expiration Date:   05/15/2025   Iron and TIBC    Standing Status:   Future    Expiration Date:   05/15/2025   Ferritin    Standing Status:   Future    Expiration Date:   05/15/2025   Protein C activity    Standing Status:   Future    Expiration Date:   05/15/2025   Antithrombin panel    Standing Status:   Future    Expiration Date:   05/15/2025   PROTEIN S PANEL    Standing Status:   Future    Expiration Date:  05/15/2025   CMP (Cancer Center  only)    Standing Status:   Future    Expiration Date:   05/15/2025     Future Appointments  Date Time Provider Department Center  06/05/2024  2:00 PM [facility] 1 [facility]  06/20/2024  1:20 PM Catherine Charlies LABOR, DO [facility] [address]  07/17/2024  2:15 PM [facility] PHLEBOTOMIST [facility] None  07/17/2024  2:45 PM Shaheen Star, Chinita, MD [facility] None     This document was completed utilizing speech recognition software. Grammatical errors, random word insertions, pronoun errors, and incomplete sentences are an occasional consequence of this system due to software limitations, ambient noise, and hardware issues. Any formal questions or concerns about the content, text or information contained within the body of this dictation should be directly addressed to the provider for clarification.

## 2024-05-18 ENCOUNTER — Encounter: Payer: Self-pay | Admitting: Oncology

## 2024-05-18 ENCOUNTER — Telehealth: Payer: Self-pay | Admitting: Oncology

## 2024-05-18 DIAGNOSIS — D649 Anemia, unspecified: Secondary | ICD-10-CM | POA: Insufficient documentation

## 2024-05-18 NOTE — Assessment & Plan Note (Signed)
 Anemia with hemoglobin decreased from 11.4 to 9.7. Possible causes include blood thinner use and low iron intake due to dietary changes. No significant bleeding reported, though there is occasional breakthrough uterine bleeding. Previous iron supplementation caused constipation. Low iron may contribute to muscle weakness and lack of energy. - Recommend over-the-counter liquid iron supplementation - Plan to check iron levels at next visit - Evaluate for other conditions that may increase risk of blood clots at next visit

## 2024-05-18 NOTE — Telephone Encounter (Signed)
 Patient has been scheduled for follow-up visit per 05/15/24 LOS.  LVM notifying pt of appt details, provided my direct number to pt if appt changes need to be made.

## 2024-06-05 ENCOUNTER — Ambulatory Visit (HOSPITAL_BASED_OUTPATIENT_CLINIC_OR_DEPARTMENT_OTHER)
Admission: RE | Admit: 2024-06-05 | Discharge: 2024-06-05 | Disposition: A | Discharge status: Home / Self Care | Source: Ambulatory Visit | Attending: Cardiology | Admitting: Cardiology

## 2024-06-05 DIAGNOSIS — I517 Cardiomegaly: Secondary | ICD-10-CM | POA: Diagnosis not present

## 2024-06-05 LAB — ECHOCARDIOGRAM COMPLETE
AR max vel: 1.75 cm2
AV Area VTI: 1.97 cm2
AV Area mean vel: 1.92 cm2
AV Mean grad: 11 mmHg
AV Peak grad: 18.3 mmHg
Ao pk vel: 2.14 m/s
Area-P 1/2: 2.66 cm2
Calc EF: 71.9 %
S' Lateral: 2.7 cm
Single Plane A2C EF: 72.8 %
Single Plane A4C EF: 71.8 %

## 2024-06-05 MED ORDER — PERFLUTREN LIPID MICROSPHERE
1.0000 mL | INTRAVENOUS | Status: AC | PRN
  Administered 2024-06-05: 2 mL via INTRAVENOUS

## 2024-06-12 ENCOUNTER — Other Ambulatory Visit: Payer: Self-pay | Admitting: Family Medicine

## 2024-06-12 NOTE — Telephone Encounter (Unsigned)
 Copied from CRM [id number]. Topic: Clinical - Medication Refill >> Jun 12, 2024 11:39 AM Mesmerise C wrote: Medication:  apixaban  (ELIQUIS ) 5 MG TABS tablet   Has the patient contacted their pharmacy? Yes (Agent: If no, request that the patient contact the pharmacy for the refill. If patient does not wish to contact the pharmacy document the reason why and proceed with request.) (Agent: If yes, when and what did the pharmacy advise?) Out of refills This is the patient's preferred pharmacy:  [id number] - [place], [place] - [address] [place] [address] [place] [address] [place] [address] Phone: [phone] Fax: [phone]  Is this the correct pharmacy for this prescription? Yes If no, delete pharmacy and type the correct one.   Has the prescription been filled recently? Yes  Is the patient out of the medication? Yes  Has the patient been seen for an appointment in the last year OR does the patient have an upcoming appointment? Yes  Can we respond through MyChart? Yes  Agent: Please be advised that Rx refills may take up to 3 business days. We ask that you follow-up with your pharmacy.

## 2024-06-13 ENCOUNTER — Ambulatory Visit (HOSPITAL_BASED_OUTPATIENT_CLINIC_OR_DEPARTMENT_OTHER): Payer: Self-pay | Admitting: Cardiology

## 2024-06-13 MED ORDER — APIXABAN 5 MG PO TABS: 5.0000 mg | ORAL_TABLET | Freq: Two times a day (BID) | ORAL | 3 refills | Status: AC

## 2024-06-13 NOTE — Telephone Encounter (Signed)
 Please inform patient I refilled her Eliquis  for another 3 months. Further refills should come from her hematology/oncology team as they make the decision after 6 months on whether to continue and if so at what dose.  Please encourage her to ask them.

## 2024-06-20 ENCOUNTER — Encounter: Payer: Self-pay | Admitting: Family Medicine

## 2024-06-20 ENCOUNTER — Ambulatory Visit: Payer: Self-pay | Admitting: Family Medicine

## 2024-06-20 VITALS — BP 124/82 | HR 92 | Wt 276.0 lb

## 2024-06-20 DIAGNOSIS — I2609 Other pulmonary embolism with acute cor pulmonale: Secondary | ICD-10-CM

## 2024-06-20 DIAGNOSIS — E1169 Type 2 diabetes mellitus with other specified complication: Secondary | ICD-10-CM | POA: Diagnosis not present

## 2024-06-20 DIAGNOSIS — M1A0720 Idiopathic chronic gout, left ankle and foot, without tophus (tophi): Secondary | ICD-10-CM | POA: Diagnosis not present

## 2024-06-20 DIAGNOSIS — Z6841 Body Mass Index (BMI) 40.0 and over, adult: Secondary | ICD-10-CM

## 2024-06-20 DIAGNOSIS — E785 Hyperlipidemia, unspecified: Secondary | ICD-10-CM | POA: Diagnosis not present

## 2024-06-20 DIAGNOSIS — Z86711 Personal history of pulmonary embolism: Secondary | ICD-10-CM

## 2024-06-20 DIAGNOSIS — I10 Essential (primary) hypertension: Secondary | ICD-10-CM

## 2024-06-20 DIAGNOSIS — M150 Primary generalized (osteo)arthritis: Secondary | ICD-10-CM

## 2024-06-20 DIAGNOSIS — Z23 Encounter for immunization: Secondary | ICD-10-CM | POA: Diagnosis not present

## 2024-06-20 LAB — CBC WITH DIFFERENTIAL/PLATELET
Basophils Absolute: 0.1 K/uL (ref 0.0–0.1)
Basophils Relative: 0.8 % (ref 0.0–3.0)
Eosinophils Absolute: 0.2 K/uL (ref 0.0–0.7)
Eosinophils Relative: 2.7 % (ref 0.0–5.0)
HCT: 34 % — ABNORMAL LOW (ref 36.0–46.0)
Hemoglobin: 10.7 g/dL — ABNORMAL LOW (ref 12.0–15.0)
Lymphocytes Relative: 29.4 % (ref 12.0–46.0)
Lymphs Abs: 2.4 K/uL (ref 0.7–4.0)
MCHC: 31.4 g/dL (ref 30.0–36.0)
MCV: 81.1 fl (ref 78.0–100.0)
Monocytes Absolute: 0.6 K/uL (ref 0.1–1.0)
Monocytes Relative: 7.4 % (ref 3.0–12.0)
Neutro Abs: 4.9 K/uL (ref 1.4–7.7)
Neutrophils Relative %: 59.7 % (ref 43.0–77.0)
Platelets: 317 K/uL (ref 150.0–400.0)
RBC: 4.19 Mil/uL (ref 3.87–5.11)
RDW: 20 % — ABNORMAL HIGH (ref 11.5–15.5)
WBC: 8.2 K/uL (ref 4.0–10.5)

## 2024-06-20 LAB — POCT GLYCOSYLATED HEMOGLOBIN (HGB A1C)
HbA1c POC (<> result, manual entry): 6.6 % (ref 4.0–5.6)
HbA1c, POC (controlled diabetic range): 6.6 % (ref 0.0–7.0)
HbA1c, POC (prediabetic range): 6.6 % — AB (ref 5.7–6.4)
Hemoglobin A1C: 6.6 % — AB (ref 4.0–5.6)

## 2024-06-20 MED ORDER — ALLOPURINOL 300 MG PO TABS: 300.0000 mg | ORAL_TABLET | Freq: Every day | ORAL | 3 refills | Status: AC

## 2024-06-20 MED ORDER — VALACYCLOVIR HCL 1 G PO TABS: ORAL_TABLET | ORAL | 2 refills | Status: AC

## 2024-06-20 MED ORDER — OXYBUTYNIN CHLORIDE ER 5 MG PO TB24: 5.0000 mg | ORAL_TABLET | Freq: Every day | ORAL | 5 refills | Status: AC

## 2024-06-20 MED ORDER — ATORVASTATIN CALCIUM 40 MG PO TABS: 40.0000 mg | ORAL_TABLET | Freq: Every day | ORAL | 3 refills | Status: AC

## 2024-06-20 MED ORDER — COLCHICINE 0.6 MG PO TABS: 0.6000 mg | ORAL_TABLET | Freq: Two times a day (BID) | ORAL | 5 refills | Status: AC | PRN

## 2024-06-20 NOTE — Progress Notes (Signed)
 Patient ID: Mckenzie [name], female  DOB: 1956/04/10, 68 y.o.   MRN: [id number] Patient Care Team    Relationship Specialty Notifications Start End  Catherine Mckenzie DELENA, DO PCP - General Family Medicine  03/25/21   Lonni Slain, MD PCP - Cardiology Cardiology  05/09/24   Levora Dickey BRAVO, MD Consulting Physician Obstetrics and Gynecology  03/30/19   Kristie Lamprey, MD Consulting Physician Gastroenterology  03/30/19   Vernona Slough, OD  Ophthalmology  09/15/21     Chief Complaint  Patient presents with   Diabetes    Chronic Conditions/illness Management    Subjective: Mckenzie [name] is a 68 y.o.  female present for chronic medical condition management All past medical history, surgical history, allergies, family history, immunizations, medications and social history were updated in the electronic medical record today. All recent labs, ED visits and hospitalizations within the last year were reviewed.  PE: 01/05/2024-Large bilateral pulmonary emboli are noted in the upper and lower lobe branches. RV/LV ratio of 1.6 is noted suggesting right heart strain. No pericardial effusion. Patient is established with hematology, then plans to have repeat labs and CT in November.  She is to continue the Eliquis  twice daily, regimen plan at least 6 months, depending upon findings from her November appointment.  HLD/morbid obesity No meds now for BP.  Lipitor 40 mg daily Patient denies chest pain, shortness of breath, dizziness or lower extremity edema. .  Pt does not take a daily baby ASA. Pt is  prescribed statin. RF: Hypertension, hyperlipidemia, diabetes, obesity, family history of heart disease  Chronic idiopathic gout involving toe of left foot without tophus Patient reports compliance with allopurinol  300 mg daily to keep her gout controlled.   She uses colchicine  or steroids with acute flares.  She states she knows she does not follow a gout friendly diet since she loves shellfish  and asparagus.  Fever blister Patient reports she gets fever blisters intermittently for the past 2 years.  She likes to have the Valtrex  on hand to use if needed.  type 2 diabetes mellitus with hyperlipidemia (HCC) Patient no longer requiring glipizide .  She was tried on Rybelsus  in the past, but cost was prohibitive. She knows she would not be able to tolerate metformin  if she did  need medication secondary to GI side effects.  Patient denies dizziness, hyperglycemic or hypoglycemic events. Patient denies numbness, tingling in the extremities or nonhealing wounds of feet.   Urinary incontinence -urgency Patient reports she has noticed since May she is experiencing urinary urgency, that is causing her incontinence if she cannot get to the bathroom in time.  She denies any burning with urination or frequency.     05/15/2024    2:48 PM 02/06/2024    1:58 PM 02/18/2023    9:28 AM 08/17/2022    1:15 PM 09/15/2021    1:49 PM  Depression screen PHQ 2/9  Decreased Interest 0 0 0 0 0  Down, Depressed, Hopeless 0 0 0 0 0  PHQ - 2 Score 0 0 0 0 0  Altered sleeping     0  Tired, decreased energy     1  Change in appetite     0  Feeling bad or failure about yourself      0  Trouble concentrating     0  Moving slowly or fidgety/restless     0  Suicidal thoughts     0  PHQ-9 Score     1  09/15/2021    1:49 PM  GAD 7 : Generalized Anxiety Score  Nervous, Anxious, on Edge 0  Control/stop worrying 0  Worry too much - different things 0  Trouble relaxing 0  Restless 0  Easily annoyed or irritable 0  Afraid - awful might happen 0  Total GAD 7 Score 0          08/23/2023    7:15 PM 02/18/2023    9:28 AM 08/16/2022   10:30 PM 08/14/2020   11:32 AM 04/01/2020   11:19 AM  Fall Risk   Falls in the past year? 0 0 0 0 0  Number falls in past yr:  0  0 0  Injury with Fall?  0  0 0  Risk for fall due to :  No Fall Risks     Follow up  Falls evaluation completed  Falls evaluation completed   Falls evaluation completed      Data saved with a previous flowsheet row definition   Immunization History  Administered Date(s) Administered   INFLUENZA, HIGH DOSE SEASONAL PF 06/20/2024   PFIZER(Purple Top)SARS-COV-2 Vaccination 11/16/2019, 12/12/2019, 06/07/2020, 12/24/2020   PNEUMOCOCCAL CONJUGATE-20 08/17/2022   Pfizer(Comirnaty)Fall Seasonal Vaccine 12 years and older 08/06/2022   Tdap 07/09/2015   Zoster Recombinant(Shingrix) 03/04/2022, 09/02/2022    No results found.  Past Medical History:  Diagnosis Date   Abnormal echocardiogram 01/12/2024   Acute pulmonary edema (HCC) 01/04/2024   Arthritis    Chicken pox    Diabetes mellitus without complication (HCC)    Gout    Hypertension    Myalgia 12/02/2021   Polyarthralgia 12/02/2021   UTI (urinary tract infection)    No Known Allergies Past Surgical History:  Procedure Laterality Date   NO PAST SURGERIES     Family History  Problem Relation Age of Onset   Heart failure Mother    Alcohol abuse Father    COPD Sister    Liver cancer Sister    Alcohol abuse Sister    Nocturnal enuresis Sister    Kidney disease Maternal Grandmother    BRCA 1/2 Neg Hx    Social History   Social History Narrative   Marital status:  Single. G0P0   Education/employment: Graduate school.Mckenzie [name]; full time; missionary work.  Speaking engagements.   Safety:      -Wears a bicycle helmet riding a bike: Yes     -smoke alarm in the home:Yes     - Feels safe in their relationships: Yes          Allergies as of 06/20/2024   No Known Allergies      Medication List        Accurate as of June 20, 2024  3:29 PM. If you have any questions, ask your nurse or doctor.          STOP taking these medications    ALPRAZolam  1 MG tablet Commonly known as: XANAX  Stopped by: Mckenzie [name]   Fish Oil 500 MG Caps Stopped by: Mckenzie [name]       TAKE these medications    allopurinol  300 MG tablet Commonly known as:  ZYLOPRIM  Take 1 tablet (300 mg total) by mouth daily.   apixaban  5 MG Tabs tablet Commonly known as: ELIQUIS  Take 1 tablet (5 mg total) by mouth 2 (two) times daily. Start after you finish the starter pack   atorvastatin  40 MG tablet Commonly known as: LIPITOR Take 1 tablet (40 mg total) by mouth daily.   colchicine   0.6 MG tablet Take 1 tablet (0.6 mg total) by mouth 2 (two) times daily as needed.   JOINT HEALTH PO Take by mouth.   multivitamin capsule Take 1 capsule by mouth daily.   oxybutynin 5 MG 24 hr tablet Commonly known as: DITROPAN-XL Take 1 tablet (5 mg total) by mouth at bedtime. Started by: Mckenzie [name]   PROBIOTIC BLEND PO Take by mouth.   valACYclovir  1000 MG tablet Commonly known as: VALTREX  2 po at outbreak onset then repeat in 12h for each outbreak        All past medical history, surgical history, allergies, family history, immunizations andmedications were updated in the EMR today and reviewed under the history and medication portions of their EMR.     ROS: 14 pt review of systems performed and negative (unless mentioned in an HPI)  Objective: BP 124/82   Pulse 92   Wt 276 lb (125.2 kg)   SpO2 97%   BMI 37.43 kg/m  Physical Exam Vitals and nursing note reviewed.  Constitutional:      General: She is not in acute distress.    Appearance: Normal appearance. She is obese. She is not ill-appearing, toxic-appearing or diaphoretic.  HENT:     Head: Normocephalic and atraumatic.  Eyes:     General: No scleral icterus.       Right eye: No discharge.        Left eye: No discharge.     Extraocular Movements: Extraocular movements intact.     Conjunctiva/sclera: Conjunctivae normal.     Pupils: Pupils are equal, round, and reactive to light.  Cardiovascular:     Rate and Rhythm: Normal rate and regular rhythm.     Heart sounds: No murmur heard. Pulmonary:     Effort: Pulmonary effort is normal. No respiratory distress.     Breath sounds:  Normal breath sounds. No wheezing, rhonchi or rales.  Musculoskeletal:     Right lower leg: No edema.     Left lower leg: No edema.  Skin:    General: Skin is warm.     Findings: No rash.  Neurological:     Mental Status: She is alert and oriented to person, place, and time. Mental status is at baseline.     Motor: No weakness.     Gait: Gait normal.  Psychiatric:        Mood and Affect: Mood normal.        Behavior: Behavior normal.        Thought Content: Thought content normal.        Judgment: Judgment normal.    Diabetic Foot Exam - Simple   Simple Foot Form Diabetic Foot exam was performed with the following findings: Yes 06/20/2024  1:23 PM  Visual Inspection No deformities, no ulcerations, no other skin breakdown bilaterally: Yes Sensation Testing Intact to touch and monofilament testing bilaterally: Yes Pulse Check Posterior Tibialis and Dorsalis pulse intact bilaterally: Yes Comments    Results for orders placed or performed in visit on 06/20/24 (from the past 48 hours)  POCT HgB A1C     Status: Abnormal   Collection Time: 06/20/24  1:19 PM  Result Value Ref Range   Hemoglobin A1C 6.6 (A) 4.0 - 5.6 %   HbA1c POC (<> result, manual entry) 6.6 4.0 - 5.6 %   HbA1c, POC (prediabetic range) 6.6 (A) 5.7 - 6.4 %   HbA1c, POC (controlled diabetic range) 6.6 0.0 - 7.0 %     Assessment/plan:  RHYLIN VENTERS is a 68 y.o. female present for chronic condition follow-up HLD Stable-no longer requiring blood pressure medications. Continue atorvastatin  40 mg daily.  Higher doses made her sick. Could consider  LDL goal<70 if able. . Low-sodium diet. Continue to exercise.  Fever blister Valtrex  as needed prescribed for her.  Refilled  type 2 diabetes mellitus (HCC) with hyperlipidemia/Morbid obesity (HCC)/BMI 50.0-59.9, adult (HCC) stable LDL goal <70 Continue atorvastatin  40 mg nightly Atorvastatin  40 mg daily - PNA series: PNA20.(After 65)-completed 2023 Flu shot:  06/20/2024 (recommneded yearly) Foot exam: UTD 06/20/2024 Eye exam: completed 03/13/2024 vision [facility] [place] A1c 6.1>6.8 >7.1> 6.9> 6.5 > 6.6 >6.6 A1c collected today.  Microalbumin: Ordered today.  Patient will complete and return it tomorrow.  Idiopathic chronic gout of left foot without tophus Stable Continue allopurinol  300 mg daily Uric acid goal < 6- at goal.  Struggles with low purine diet. Has a prednisone  taper on hand in the event of flare  PE: 01/05/2024 PE with cor pulmonale - est with heme - at least 6 mos anticoag  - refilled Eliquis  -hematology appointment in November.  Eliquis  use duration will be decided at that visit.  Urinary incontinence-urge Trial of oxybutynin 5 mg daily   Influenza vaccine administered today Return in about 24 weeks (around 12/05/2024) for Routine chronic condition follow-up.  Orders Placed This Encounter  Procedures   Flu vaccine HIGH DOSE PF(Fluzone Trivalent)   Urine Microalbumin w/creat. ratio   CBC w/Diff   POCT HgB A1C   Meds ordered this encounter  Medications   allopurinol  (ZYLOPRIM ) 300 MG tablet    Sig: Take 1 tablet (300 mg total) by mouth daily.    Dispense:  90 tablet    Refill:  3   atorvastatin  (LIPITOR) 40 MG tablet    Sig: Take 1 tablet (40 mg total) by mouth daily.    Dispense:  90 tablet    Refill:  3   colchicine  0.6 MG tablet    Sig: Take 1 tablet (0.6 mg total) by mouth 2 (two) times daily as needed.    Dispense:  12 tablet    Refill:  5    Hold until pt request   valACYclovir  (VALTREX ) 1000 MG tablet    Sig: 2 po at outbreak onset then repeat in 12h for each outbreak    Dispense:  20 tablet    Refill:  2    Hold until pt request   oxybutynin (DITROPAN-XL) 5 MG 24 hr tablet    Sig: Take 1 tablet (5 mg total) by mouth at bedtime.    Dispense:  30 tablet    Refill:  5    Referral Orders  No referral(s) requested today      Note is dictated utilizing voice recognition software. Although note  has been proof read prior to signing, occasional typographical errors still can be missed. If any questions arise, please do not hesitate to call for verification.  Electronically signed by: Mckenzie Bellini, DO [facility] [place]

## 2024-06-20 NOTE — Patient Instructions (Addendum)
 Return in about 24 weeks (around 12/05/2024) for Routine chronic condition follow-up.        Great to see you today.  I have refilled the medication(s) we provide.   If labs were collected or images ordered, we will inform you of  results once we have received them and reviewed. We will contact you either by echart message, or telephone call.  Please give ample time to the testing facility, and our office to run,  receive and review results. Please do not call inquiring of results, even if you can see them in your chart. We will contact you as soon as we are able. If it has been over 1 week since the test was completed, and you have not yet heard from us , then please call us .    - echart message- for normal results that have been seen by the patient already.   - telephone call: abnormal results or if patient has not viewed results in their echart.  If a referral to a specialist was entered for you, please call us  in 2 weeks if you have not heard from the specialist office to schedule.

## 2024-06-21 ENCOUNTER — Ambulatory Visit: Payer: Self-pay | Admitting: Family Medicine

## 2024-06-21 DIAGNOSIS — R809 Proteinuria, unspecified: Secondary | ICD-10-CM

## 2024-06-21 LAB — MICROALBUMIN / CREATININE URINE RATIO
Creatinine,U: 127.7 mg/dL
Microalb Creat Ratio: 55.3 mg/g — ABNORMAL HIGH (ref 0.0–30.0)
Microalb, Ur: 7.1 mg/dL — ABNORMAL HIGH (ref 0.0–1.9)

## 2024-06-22 DIAGNOSIS — R809 Proteinuria, unspecified: Secondary | ICD-10-CM | POA: Insufficient documentation

## 2024-06-22 MED ORDER — LISINOPRIL 2.5 MG PO TABS: 2.5000 mg | ORAL_TABLET | Freq: Every day | ORAL | 3 refills | Status: AC

## 2024-07-09 ENCOUNTER — Ambulatory Visit (HOSPITAL_BASED_OUTPATIENT_CLINIC_OR_DEPARTMENT_OTHER)
Admission: RE | Admit: 2024-07-09 | Discharge: 2024-07-09 | Disposition: A | Discharge status: Home / Self Care | Source: Ambulatory Visit | Attending: Oncology | Admitting: Oncology

## 2024-07-09 DIAGNOSIS — I2609 Other pulmonary embolism with acute cor pulmonale: Secondary | ICD-10-CM | POA: Diagnosis present

## 2024-07-09 MED ORDER — IOHEXOL 350 MG/ML SOLN
75.0000 mL | Freq: Once | INTRAVENOUS | Status: AC | PRN
  Administered 2024-07-09: 75 mL via INTRAVENOUS

## 2024-07-17 ENCOUNTER — Inpatient Hospital Stay: Attending: Oncology

## 2024-07-17 ENCOUNTER — Encounter: Payer: Self-pay | Admitting: Oncology

## 2024-07-17 ENCOUNTER — Inpatient Hospital Stay (HOSPITAL_BASED_OUTPATIENT_CLINIC_OR_DEPARTMENT_OTHER): Admitting: Oncology

## 2024-07-17 VITALS — BP 116/69 | HR 88 | Temp 97.5°F | Resp 16 | Wt 370.6 lb

## 2024-07-17 DIAGNOSIS — Z86711 Personal history of pulmonary embolism: Secondary | ICD-10-CM | POA: Diagnosis present

## 2024-07-17 DIAGNOSIS — D649 Anemia, unspecified: Secondary | ICD-10-CM | POA: Diagnosis not present

## 2024-07-17 DIAGNOSIS — I2609 Other pulmonary embolism with acute cor pulmonale: Secondary | ICD-10-CM

## 2024-07-17 DIAGNOSIS — Z7982 Long term (current) use of aspirin: Secondary | ICD-10-CM | POA: Insufficient documentation

## 2024-07-17 LAB — CBC WITH DIFFERENTIAL (CANCER CENTER ONLY)
Abs Immature Granulocytes: 0.04 K/uL (ref 0.00–0.07)
Basophils Absolute: 0 K/uL (ref 0.0–0.1)
Basophils Relative: 1 %
Eosinophils Absolute: 0.2 K/uL (ref 0.0–0.5)
Eosinophils Relative: 3 %
HCT: 35.3 % — ABNORMAL LOW (ref 36.0–46.0)
Hemoglobin: 10.7 g/dL — ABNORMAL LOW (ref 12.0–15.0)
Immature Granulocytes: 1 %
Lymphocytes Relative: 27 %
Lymphs Abs: 1.8 K/uL (ref 0.7–4.0)
MCH: 25.5 pg — ABNORMAL LOW (ref 26.0–34.0)
MCHC: 30.3 g/dL (ref 30.0–36.0)
MCV: 84 fL (ref 80.0–100.0)
Monocytes Absolute: 0.5 K/uL (ref 0.1–1.0)
Monocytes Relative: 7 %
Neutro Abs: 4.1 K/uL (ref 1.7–7.7)
Neutrophils Relative %: 61 %
Platelet Count: 293 K/uL (ref 150–400)
RBC: 4.2 MIL/uL (ref 3.87–5.11)
RDW: 18.9 % — ABNORMAL HIGH (ref 11.5–15.5)
WBC Count: 6.6 K/uL (ref 4.0–10.5)
nRBC: 0 % (ref 0.0–0.2)

## 2024-07-17 LAB — FERRITIN: Ferritin: 34 ng/mL (ref 11–307)

## 2024-07-17 LAB — CMP (CANCER CENTER ONLY)
ALT: 24 U/L (ref 0–44)
AST: 20 U/L (ref 15–41)
Albumin: 4.1 g/dL (ref 3.5–5.0)
Alkaline Phosphatase: 99 U/L (ref 38–126)
Anion gap: 10 (ref 5–15)
BUN: 15 mg/dL (ref 8–23)
CO2: 28 mmol/L (ref 22–32)
Calcium: 10 mg/dL (ref 8.9–10.3)
Chloride: 102 mmol/L (ref 98–111)
Creatinine: 0.87 mg/dL (ref 0.44–1.00)
GFR, Estimated: >60 mL/min (ref >60)
Glucose, Bld: 141 mg/dL — ABNORMAL HIGH (ref 70–99)
Potassium: 4.3 mmol/L (ref 3.5–5.1)
Sodium: 140 mmol/L (ref 135–145)
Total Bilirubin: 0.7 mg/dL (ref 0.0–1.2)
Total Protein: 8.4 g/dL — ABNORMAL HIGH (ref 6.5–8.1)

## 2024-07-17 LAB — IRON AND TIBC
Iron: 37 ug/dL (ref 28–170)
Saturation Ratios: 10 % — ABNORMAL LOW (ref 10.4–31.8)
TIBC: 371 ug/dL (ref 250–450)
UIBC: 334 ug/dL

## 2024-07-17 LAB — D-DIMER, QUANTITATIVE: D-Dimer, Quant: 0.43 ug{FEU}/mL (ref 0.00–0.50)

## 2024-07-17 NOTE — Progress Notes (Signed)
 [facility]  HEMATOLOGY CLINIC PROGRESS NOTE  PATIENT NAME: Mckenzie [name]   MR#: [id number] DOB: 03-01-1956  Patient Care Team: Catherine Charlies DELENA, DO as PCP - General (Family Medicine) Lonni Slain, MD as PCP - Cardiology (Cardiology) Levora Dickey BRAVO, MD as Consulting Physician (Obstetrics and Gynecology) Kristie Lamprey, MD as Consulting Physician (Gastroenterology) Vernona Slough, [place] (Ophthalmology)  Date of visit: 07/17/2024   ASSESSMENT & PLAN:   Mckenzie [name] is a 68 y.o. lady with a past medical history of diabetes mellitus, hypertension, polyarthralgia, myalgia, was referred to our service for evaluation of possible hypercoagulable state, given acute bilateral pulmonary embolism diagnosed in May 2025.    History of pulmonary embolism w/ cor pulmonale 01/05/2024 Bilateral pulmonary embolism diagnosed on 01/05/2024 after presenting with dyspnea and headache.   No provoking factors identified, and leg ultrasound was negative for DVT.   Discussed high recurrence risk without anticoagulation, with a 25% chance of recurrence if stopped after one year. Potential need for lifelong reduced-dose anticoagulation was discussed. Explained risks and benefits of Eliquis , noting its lower bleeding risk compared to Coumadin and lack of dietary interactions.   Given unprovoked pulmonary embolism, we pursued thrombophilia workup on her consultation with us  on 02/06/2024.  Factor V Leiden mutation testing, prothrombin gene mutation testing, beta-2  glycoprotein antibodies, anticardiolipin antibodies, lupus anticoagulant were all unremarkable.  Rest of the hypercoagulable workup will be deferred as it can be falsely abnormal given recent embolism and ongoing Eliquis  use. D-dimer was better at 1.06, compared to 2.35 previously.  Labs today showed improved D-dimer of 0.43   She completed Eliquis  for 6 months.  Repeat CT chest angiogram on 07/09/2024 showed no evidence of  pulmonary embolism or other abnormalities.  Patient preferred to come off of Eliquis .  Switched to aspirin 81 mg daily from 07/17/2024.   She is up-to-date on age-appropriate cancer screening.  Advise on precautions during travel, including staying hydrated, performing ankle exercises, and walking every couple of hours on long flights.  In case of any recurrence of thromboembolism in the future, she will need to be on lifelong anticoagulation and patient understands this.  Normocytic anemia Hemoglobin improved from 9.7 to 10.7, likely due to iron supplementation. Ferritin levels are adequate. Continued improvement expected after discontinuation of Eliquis . - Continue monitoring hemoglobin and iron levels. - Will reassess anemia status in four months.   I spent a total of 27 minutes during this encounter with the patient including review of chart and various tests results, discussions about plan of care and coordination of care plan.  I reviewed lab results and outside records for this visit and discussed relevant results with the patient. Diagnosis, plan of care and treatment options were also discussed in detail with the patient. Opportunity provided to ask questions and answers provided to her apparent satisfaction. Provided instructions to call our clinic with any problems, questions or concerns prior to return visit. I recommended to continue follow-up with PCP and sub-specialists. She verbalized understanding and agreed with the plan. No barriers to learning was detected.  Chinita Patten, MD  07/17/2024 5:58 PM  [facility] - A DEPT OF [facility] [id number] [place] [address] Dept: [phone] Dept Fax: [phone]   CHIEF COMPLAINT/ REASON FOR VISIT:  Follow-up for history of bilateral pulmonary emboli in May 2025.  INTERVAL HISTORY:  Discussed the use of AI scribe software for clinical note  transcription with the  patient, who gave verbal consent to proceed.  History of Present Illness  Mckenzie [name] is a 68 year old female who presents for follow-up regarding her anticoagulation therapy for a resolved blood clot.  She has been on Eliquis  for six months following the diagnosis of a blood clot. Recent imaging shows no evidence of clot, and her D-dimer levels have normalized to 0.43. No chest pain or breathing difficulties since starting Eliquis .  She has been taking liquid iron supplements, which has improved her hemoglobin levels from 9.7 to 10.7, although it remains below the normal range of 12. Her ferritin levels are stable, and other iron labs are pending. She notes that her kidney and liver function tests are satisfactory.  No current use of aspirin.  Her recent mammogram and eye exams were conducted in June, and she completed a Cologuard test in October. She has previously undergone a colonoscopy but has not repeated it due to financial constraints and lack of insurance.  No chest pain or breathing difficulties.    SUMMARY OF HEMATOLOGIC HISTORY:  She experienced blood clots in both lungs, discovered after seeking medical attention on 01/05/2024 for persistent shortness of breath and a headache following a short flight of about 45 minutes. She had been feeling unwell for some time, attributing her symptoms to obesity, but realized the severity of her condition after the flight. A D-dimer test revealed extremely high levels, leading to a two-day hospital stay where she was started on Eliquis .   She has been taking Eliquis , one tablet twice a day, and today marks the last of her 30-day pack. She is in the process of securing a refill, with assistance from her social worker, Molly Marten, to find the most affordable option due to recent changes in her Medicare and Medicaid status.   She has no history of surgeries or family history of similar conditions. Her leg ultrasound  was negative for clots. No history of chest pain or trouble breathing currently. She is up to date on mammograms and colonoscopies, with the next mammogram scheduled for the end of the month. Her blood pressure is well-controlled, and she is no longer on blood pressure medication.   She travels frequently by air and had been experiencing shortness of breath for some time before the recent incident. She drinks a couple of quarts of water daily and takes magnesium  supplements but no new medications or over-the-counter supplements recently. No history of palpitations or fast heartbeat at home.   Negative Thrombotic Risk Factors: Previous VTE, Recent surgery (within 3 months), Recent trauma (within 3 months), Recent admission to hospital with acute illness (within 3 months), Paralysis, paresis, or recent plaster cast immobilization of lower extremity, Central venous catheterization, Bed rest >72 hours within 3 months, Sedentary journey lasting >8 hours within 4 weeks, Pregnancy, Recent cesarean section (within 3 months), Estrogen therapy, Testosterone therapy, Erythropoiesis-stimulating agent, Recent COVID diagnosis (within 3 months), Active cancer, Non-malignant, chronic inflammatory condition, Known thrombophilic condition, Smoking.   Bilateral pulmonary embolism diagnosed on 01/05/2024 after presenting with dyspnea and headache.    No provoking factors identified, and leg ultrasound was negative for DVT.    Currently on Eliquis , with a plan for at least six months of anticoagulation due to the unprovoked nature and submassive size of the emboli.   Discussed high recurrence risk without anticoagulation, with a 25% chance of recurrence if stopped after one year. Potential need for lifelong reduced-dose anticoagulation was discussed. Explained risks and benefits of Eliquis , noting its  lower bleeding risk compared to Coumadin and lack of dietary interactions.    Given unprovoked pulmonary embolism, we pursued  thrombophilia workup on her consultation with us  on 02/06/2024.  Factor V Leiden mutation testing, prothrombin gene mutation testing, beta-2  glycoprotein antibodies, anticardiolipin antibodies, lupus anticoagulant were all unremarkable.  Rest of the hypercoagulable workup will be deferred as it can be falsely abnormal given recent embolism and ongoing Eliquis  use. D-dimer was better at 1.06, compared to 2.35 previously.   She completed Eliquis  for 6 months.  Repeat CT chest angiogram on 07/09/2024 showed no evidence of pulmonary embolism or other abnormalities.  Patient preferred to come off of Eliquis .  Switched to aspirin 81 mg daily from 07/17/2024.   She is up-to-date on age-appropriate cancer screening.  I have reviewed the past medical history, past surgical history, social history and family history with the patient and they are unchanged from previous note.  ALLERGIES: She has no known allergies.  MEDICATIONS:  Current Outpatient Medications  Medication Sig Dispense Refill   allopurinol  (ZYLOPRIM ) 300 MG tablet Take 1 tablet (300 mg total) by mouth daily. 90 tablet 3   apixaban  (ELIQUIS ) 5 MG TABS tablet Take 1 tablet (5 mg total) by mouth 2 (two) times daily. Start after you finish the starter pack 60 tablet 3   atorvastatin  (LIPITOR) 40 MG tablet Take 1 tablet (40 mg total) by mouth daily. 90 tablet 3   colchicine  0.6 MG tablet Take 1 tablet (0.6 mg total) by mouth 2 (two) times daily as needed. 12 tablet 5   lisinopril (ZESTRIL) 2.5 MG tablet Take 1 tablet (2.5 mg total) by mouth daily. 90 tablet 3   Misc Natural Products (JOINT HEALTH PO) Take by mouth.     Multiple Vitamin (MULTIVITAMIN) capsule Take 1 capsule by mouth daily.     oxybutynin (DITROPAN-XL) 5 MG 24 hr tablet Take 1 tablet (5 mg total) by mouth at bedtime. 30 tablet 5   Probiotic Product (PROBIOTIC BLEND PO) Take by mouth.     valACYclovir  (VALTREX ) 1000 MG tablet 2 po at outbreak onset then repeat in 12h for each  outbreak 20 tablet 2   No current facility-administered medications for this visit.     REVIEW OF SYSTEMS:    Review of Systems - Oncology  All other pertinent systems were reviewed with the patient and are negative.  PHYSICAL EXAMINATION:    Onc Performance Status - 07/17/24 1459       ECOG Perf Status   ECOG Perf Status Restricted in physically strenuous activity but ambulatory and able to carry out work of a light or sedentary nature, e.g., light house work, office work      KPS SCALE   KPS % SCORE Able to carry on normal activity, minor s/s of disease           Vitals:   07/17/24 1436 07/17/24 1439  BP: (!) 142/75 116/69  Pulse: 88   Resp: 16   Temp: (!) 97.5 F (36.4 C)   SpO2: 100%    Filed Weights   07/17/24 1436  Weight: (!) 370 lb 9.6 oz (168.1 kg)    Physical Exam Constitutional:      General: She is not in acute distress.    Appearance: Normal appearance.  HENT:     Head: Normocephalic and atraumatic.  Cardiovascular:     Rate and Rhythm: Normal rate.  Pulmonary:     Effort: Pulmonary effort is normal. No respiratory distress.  Abdominal:  General: There is no distension.  Neurological:     General: No focal deficit present.     Mental Status: She is alert and oriented to person, place, and time.  Psychiatric:        Mood and Affect: Mood normal.        Behavior: Behavior normal.     LABORATORY DATA:   I have reviewed the data as listed.  Results for orders placed or performed in visit on 07/17/24  CMP (Cancer Center only)  Result Value Ref Range   Sodium 140 135 - 145 mmol/L   Potassium 4.3 3.5 - 5.1 mmol/L   Chloride 102 98 - 111 mmol/L   CO2 28 22 - 32 mmol/L   Glucose, Bld 141 (H) 70 - 99 mg/dL   BUN 15 8 - 23 mg/dL   Creatinine 9.12 9.55 - 1.00 mg/dL   Calcium  10.0 8.9 - 10.3 mg/dL   Total Protein 8.4 (H) 6.5 - 8.1 g/dL   Albumin 4.1 3.5 - 5.0 g/dL   AST 20 15 - 41 U/L   ALT 24 0 - 44 U/L   Alkaline Phosphatase 99 38  - 126 U/L   Total Bilirubin 0.7 0.0 - 1.2 mg/dL   GFR, Estimated >39 >39 mL/min   Anion gap 10 5 - 15  Ferritin  Result Value Ref Range   Ferritin 34 11 - 307 ng/mL  D-dimer, quantitative  Result Value Ref Range   D-Dimer, Quant 0.43 0.00 - 0.50 ug/mL-FEU  CBC with Differential (Cancer Center Only)  Result Value Ref Range   WBC Count 6.6 4.0 - 10.5 K/uL   RBC 4.20 3.87 - 5.11 MIL/uL   Hemoglobin 10.7 (L) 12.0 - 15.0 g/dL   HCT 64.6 (L) 63.9 - 53.9 %   MCV 84.0 80.0 - 100.0 fL   MCH 25.5 (L) 26.0 - 34.0 pg   MCHC 30.3 30.0 - 36.0 g/dL   RDW 81.0 (H) 88.4 - 84.4 %   Platelet Count 293 150 - 400 K/uL   nRBC 0.0 0.0 - 0.2 %   Neutrophils Relative % 61 %   Neutro Abs 4.1 1.7 - 7.7 K/uL   Lymphocytes Relative 27 %   Lymphs Abs 1.8 0.7 - 4.0 K/uL   Monocytes Relative 7 %   Monocytes Absolute 0.5 0.1 - 1.0 K/uL   Eosinophils Relative 3 %   Eosinophils Absolute 0.2 0.0 - 0.5 K/uL   Basophils Relative 1 %   Basophils Absolute 0.0 0.0 - 0.1 K/uL   Immature Granulocytes 1 %   Abs Immature Granulocytes 0.04 0.00 - 0.07 K/uL    RADIOGRAPHIC STUDIES:  No recent pertinent imaging studies available to review.  Orders Placed This Encounter  Procedures   CBC with Differential (Cancer Center Only)    Standing Status:   Future    Expiration Date:   07/17/2025   Iron and TIBC    Standing Status:   Future    Expiration Date:   07/17/2025   Ferritin    Standing Status:   Future    Expiration Date:   07/17/2025   D-dimer, quantitative    Standing Status:   Future    Expiration Date:   07/17/2025     Future Appointments  Date Time Provider Department Center  11/13/2024  2:15 PM [facility] PHLEBOTOMIST [facility] None  11/13/2024  2:45 PM Jadelynn Boylan, Chinita, MD [facility] None     This document was completed utilizing speech recognition software. Grammatical errors, random word  insertions, pronoun errors, and incomplete sentences are an occasional consequence of this system due to software  limitations, ambient noise, and hardware issues. Any formal questions or concerns about the content, text or information contained within the body of this dictation should be directly addressed to the provider for clarification.

## 2024-07-17 NOTE — Assessment & Plan Note (Addendum)
 Bilateral pulmonary embolism diagnosed on 01/05/2024 after presenting with dyspnea and headache.   No provoking factors identified, and leg ultrasound was negative for DVT.   Discussed high recurrence risk without anticoagulation, with a 25% chance of recurrence if stopped after one year. Potential need for lifelong reduced-dose anticoagulation was discussed. Explained risks and benefits of Eliquis , noting its lower bleeding risk compared to Coumadin and lack of dietary interactions.   Given unprovoked pulmonary embolism, we pursued thrombophilia workup on her consultation with us  on 02/06/2024.  Factor V Leiden mutation testing, prothrombin gene mutation testing, beta-2  glycoprotein antibodies, anticardiolipin antibodies, lupus anticoagulant were all unremarkable.  Rest of the hypercoagulable workup will be deferred as it can be falsely abnormal given recent embolism and ongoing Eliquis  use. D-dimer was better at 1.06, compared to 2.35 previously.  Labs today showed improved D-dimer of 0.43   She completed Eliquis  for 6 months.  Repeat CT chest angiogram on 07/09/2024 showed no evidence of pulmonary embolism or other abnormalities.  Patient preferred to come off of Eliquis .  Switched to aspirin 81 mg daily from 07/17/2024.   She is up-to-date on age-appropriate cancer screening.  Advise on precautions during travel, including staying hydrated, performing ankle exercises, and walking every couple of hours on long flights.  In case of any recurrence of thromboembolism in the future, she will need to be on lifelong anticoagulation and patient understands this.

## 2024-07-17 NOTE — Assessment & Plan Note (Addendum)
 Hemoglobin improved from 9.7 to 10.7, likely due to iron supplementation. Ferritin levels are adequate. Continued improvement expected after discontinuation of Eliquis . - Continue monitoring hemoglobin and iron levels. - Will reassess anemia status in four months.

## 2024-07-18 LAB — PROTEIN S PANEL
Protein S Activity: 96 % (ref 63–140)
Protein S Ag, Free: 113 % (ref 61–136)
Protein S Ag, Total: 112 % (ref 60–150)

## 2024-07-18 LAB — ANTITHROMBIN PANEL
AT III AG PPP IMM-ACNC: 97 % (ref 72–124)
Antithrombin Activity: 120 % (ref 75–135)

## 2024-07-18 LAB — PROTEIN C ACTIVITY: Protein C Activity: 115 % (ref 73–180)

## 2024-07-24 ENCOUNTER — Encounter: Payer: Self-pay | Admitting: Family Medicine

## 2024-07-25 MED ORDER — ONDANSETRON HCL 4 MG PO TABS: 4.0000 mg | ORAL_TABLET | Freq: Three times a day (TID) | ORAL | 5 refills | Status: AC | PRN

## 2024-11-13 ENCOUNTER — Inpatient Hospital Stay

## 2024-11-13 ENCOUNTER — Inpatient Hospital Stay: Admitting: Oncology
